# Patient Record
Sex: Female | Born: 1970 | Race: Black or African American | Hispanic: No | Marital: Single | Smoking: Never smoker
Health system: Southern US, Community
[De-identification: ages and names within clinical notes are randomized; demographics above are authoritative.]

## PROBLEM LIST (undated history)

## (undated) DIAGNOSIS — C649 Malignant neoplasm of unspecified kidney, except renal pelvis: Secondary | ICD-10-CM

## (undated) DIAGNOSIS — Z862 Personal history of diseases of the blood and blood-forming organs and certain disorders involving the immune mechanism: Secondary | ICD-10-CM

## (undated) DIAGNOSIS — D259 Leiomyoma of uterus, unspecified: Secondary | ICD-10-CM

## (undated) DIAGNOSIS — I8392 Asymptomatic varicose veins of left lower extremity: Secondary | ICD-10-CM

## (undated) HISTORY — DX: Malignant neoplasm of unspecified kidney, except renal pelvis: C64.9

## (undated) HISTORY — DX: Personal history of diseases of the blood and blood-forming organs and certain disorders involving the immune mechanism: Z86.2

## (undated) HISTORY — DX: Leiomyoma of uterus, unspecified: D25.9

---

## 1998-04-06 ENCOUNTER — Inpatient Hospital Stay (HOSPITAL_COMMUNITY): Admission: AD | Admit: 1998-04-06 | Discharge: 1998-04-06 | Payer: Self-pay | Admitting: Obstetrics and Gynecology

## 1998-05-21 ENCOUNTER — Inpatient Hospital Stay (HOSPITAL_COMMUNITY): Admission: AD | Admit: 1998-05-21 | Discharge: 1998-05-24 | Payer: Self-pay | Admitting: Obstetrics and Gynecology

## 1998-06-24 ENCOUNTER — Other Ambulatory Visit: Admission: RE | Admit: 1998-06-24 | Discharge: 1998-06-24 | Payer: Self-pay | Admitting: Obstetrics and Gynecology

## 2010-11-05 ENCOUNTER — Encounter: Payer: Self-pay | Admitting: Family Medicine

## 2013-01-29 ENCOUNTER — Encounter: Payer: Self-pay | Admitting: Obstetrics and Gynecology

## 2013-01-29 ENCOUNTER — Ambulatory Visit (INDEPENDENT_AMBULATORY_CARE_PROVIDER_SITE_OTHER): Payer: BC Managed Care – PPO | Admitting: Obstetrics and Gynecology

## 2013-01-29 VITALS — BP 124/82 | Ht 67.0 in | Wt 213.0 lb

## 2013-01-29 DIAGNOSIS — D649 Anemia, unspecified: Secondary | ICD-10-CM

## 2013-01-29 DIAGNOSIS — Z01419 Encounter for gynecological examination (general) (routine) without abnormal findings: Secondary | ICD-10-CM

## 2013-01-29 DIAGNOSIS — Z0000 Encounter for general adult medical examination without abnormal findings: Secondary | ICD-10-CM

## 2013-01-29 DIAGNOSIS — Z113 Encounter for screening for infections with a predominantly sexual mode of transmission: Secondary | ICD-10-CM

## 2013-01-29 DIAGNOSIS — N920 Excessive and frequent menstruation with regular cycle: Secondary | ICD-10-CM

## 2013-01-29 LAB — CBC
HCT: 32 % — ABNORMAL LOW (ref 36.0–46.0)
Hemoglobin: 10.3 g/dL — ABNORMAL LOW (ref 12.0–15.0)
MCH: 23.5 pg — ABNORMAL LOW (ref 26.0–34.0)
MCHC: 32.2 g/dL (ref 30.0–36.0)
MCV: 72.9 fL — ABNORMAL LOW (ref 78.0–100.0)
Platelets: 367 10*3/uL (ref 150–400)
RBC: 4.39 MIL/uL (ref 3.87–5.11)
RDW: 15.3 % (ref 11.5–15.5)
WBC: 5.5 10*3/uL (ref 4.0–10.5)

## 2013-01-29 LAB — POCT URINALYSIS DIPSTICK
Bilirubin, UA: NEGATIVE
Blood, UA: NEGATIVE
Glucose, UA: NEGATIVE
Ketones, UA: NEGATIVE
Leukocytes, UA: NEGATIVE
Nitrite, UA: NEGATIVE
Protein, UA: NEGATIVE
Urobilinogen, UA: 1
pH, UA: 5

## 2013-01-29 LAB — HEMOGLOBIN, FINGERSTICK: Hemoglobin, fingerstick: 10.5 g/dL — ABNORMAL LOW (ref 12.0–16.0)

## 2013-01-29 NOTE — Progress Notes (Signed)
Patient ID: Candace Graham, female   DOB: January 17, 1971, 42 y.o.   MRN: [id number] 42 y.o.  Single  African American female   [phone] here for annual exam.   Patient had exposure to HIV 20 years ago and did antiviral prophylaxis.  Never converted.    Menses changes over the last 6 months.  Had 2 menses last month for the first time ever.  Has some lower abdominal discomfort during menses.  Does not take NSAIDs.  Wears tampon and pads and changes every 1 - 2 hours.  Clotting.  Can stain clothing if not careful.  Patient thinks she may have fibroids but has never had an ultrasound.  Feels cold all the time.  No regular blood work in a long time.  Having som hot flashes.    Sexually active.  Has a new partner.  Does not use condoms. Used Norplant in the past but did not like it due to hair growth.    Mother had Graves Disease.    Patient's last menstrual period was 01/22/2013.          Sexually active: yes  The current method of family planning is none.   Declines pregnancy. Exercising: no Last mammogram: 11/2012 wnl:  [facility]  Last pap smear:  05/2012 wnl.   History of abnormal pap: no Smoking: no Alcohol: no Last colonoscopy: n/a Last Bone Density:  n/a Last tetanus shot: 8-10 yrs ago Had Hep B vaccine series. Last cholesterol check: 2-3 yrs ago  Hgb:  10.5              Urine: 1+urobilinogen    Health Maintenance  Topic Date Due  . Pap Smear  11/07/1987  . Tetanus/tdap  11/06/1988  . Influenza Vaccine  06/15/2013    Family History  Problem Relation Age of Onset  . Pancreatic cancer Mother   . Hypertension Mother   . Stroke Mother   . Thyroid disease Mother   . Hypertension Brother     There is no problem list on file for this patient.   Past Medical History  Diagnosis Date  . Abnormal uterine bleeding     Heavy menstrual cycles  . Anemia     History reviewed. No pertinent past surgical history.  Allergies: Review of patient's allergies  indicates no known allergies.  No current outpatient prescriptions on file.   No current facility-administered medications for this visit.    ROS: Pertinent items are noted in HPI.  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  Exam:    BP 124/82  Ht 5\' 7"  (1.702 m)  Wt 213 lb (96.616 kg)  BMI 33.35 kg/m2  LMP 01/22/2013   Wt Readings from Last 3 Encounters:  01/29/13 213 lb (96.616 kg)     Ht Readings from Last 3 Encounters:  01/29/13 5\' 7"  (1.702 m)    General appearance: alert, cooperative and appears stated age Head: Normocephalic, without obvious abnormality, atraumatic Neck: no adenopathy, supple, symmetrical, trachea midline and thyroid not enlarged, symmetric, no tenderness/mass/nodules Lungs: clear to auscultation bilaterally Breasts: Inspection negative, No nipple retraction or dimpling, No nipple discharge or bleeding, No axillary or supraclavicular adenopathy, Normal to palpation without dominant masses Heart: regular rate and rhythm Abdomen: soft, non-tender; bowel sounds normal; no masses,  no organomegaly Extremities: extremities normal, atraumatic, no cyanosis or edema Skin: Skin color, texture, turgor normal. No rashes or lesions Lymph nodes: Cervical, supraclavicular, and axillary nodes normal.  No abnormal inguinal nodes palpated Neurologic: Grossly normal   Pelvic: External genitalia:  no lesions              Urethra:  normal appearing urethra with no masses, tenderness or lesions              Bartholins and Skenes: normal                 Vagina: normal appearing vagina with normal color and discharge, no lesions              Cervix: normal appearance              Pap taken: yes and HR HPV.        Bimanual Exam:  Uterus:  uterus is normal size, shape, consistency and nontender                                      Adnexa: normal adnexa in size, nontender and no masses                                       Rectovaginal: Confirms                                      Anus:  normal sphincter tone, no lesions  A: normal gyn exam Menorrhagia with anemia. Desire for STD testing No current contraception     P: mammogram done.  Will sign release for results from [facility] STD testing pap smear and HR HPV Lipid profile, CMP, TSH, CBC, Fe and IBS studies Return for pelvic ultrasound and a discussion of options for treatment of menorrhagia Start OTC Fe sulfate 325 mg po q day Recheck hemoglobin in 4 weeks   An After Visit Summary was printed and given to the patient.

## 2013-01-29 NOTE — Patient Instructions (Addendum)
EXERCISE AND DIET:  We recommended that you start or continue a regular exercise program for good health. Regular exercise means any activity that makes your heart beat faster and makes you sweat.  We recommend exercising at least 30 minutes per day at least 3 days a week, preferably 4 or 5.  We also recommend a diet low in fat and sugar.  Inactivity, poor dietary choices and obesity can cause diabetes, heart attack, stroke, and kidney damage, among others.    ALCOHOL AND SMOKING:  Women should limit their alcohol intake to no more than 7 drinks/beers/glasses of wine (combined, not each!) per week. Moderation of alcohol intake to this level decreases your risk of breast cancer and liver damage. And of course, no recreational drugs are part of a healthy lifestyle.  And absolutely no smoking or even second hand smoke. Most people know smoking can cause heart and lung diseases, but did you know it also contributes to weakening of your bones? Aging of your skin?  Yellowing of your teeth and nails?  CALCIUM AND VITAMIN D:  Adequate intake of calcium and Vitamin D are recommended.  The recommendations for exact amounts of these supplements seem to change often, but generally speaking 600 mg of calcium (either carbonate or citrate) and 800 units of Vitamin D per day seems prudent. Certain women may benefit from higher intake of Vitamin D.  If you are among these women, your doctor will have told you during your visit.    PAP SMEARS:  Pap smears, to check for cervical cancer or precancers,  have traditionally been done yearly, although recent scientific advances have shown that most women can have pap smears less often.  However, every woman still should have a physical exam from her gynecologist every year. It will include a breast check, inspection of the vulva and vagina to check for abnormal growths or skin changes, a visual exam of the cervix, and then an exam to evaluate the size and shape of the uterus and  ovaries.  And after 43 years of age, a rectal exam is indicated to check for rectal cancers. We will also provide age appropriate advice regarding health maintenance, like when you should have certain vaccines, screening for sexually transmitted diseases, bone density testing, colonoscopy, mammograms, etc.   MAMMOGRAMS:  All women over 40 years old should have a yearly mammogram. Many facilities now offer a "3D" mammogram, which may cost around $50 extra out of pocket. If possible,  we recommend you accept the option to have the 3D mammogram performed.  It both reduces the number of women who will be called back for extra views which then turn out to be normal, and it is better than the routine mammogram at detecting truly abnormal areas.    COLONOSCOPY:  Colonoscopy to screen for colon cancer is recommended for all women at age 50.  We know, you hate the idea of the prep.  We agree, BUT, having colon cancer and not knowing it is worse!!  Colon cancer so often starts as a polyp that can be seen and removed at colonscopy, which can quite literally save your life!  And if your first colonoscopy is normal and you have no family history of colon cancer, most women don't have to have it again for 10 years.  Once every ten years, you can do something that may end up saving your life, right?  We will be happy to help you get it scheduled when you are ready.    Be sure to check your insurance coverage so you understand how much it will cost.  It may be covered as a preventative service at no cost, but you should check your particular policy.    Menorrhagia Dysfunctional uterine bleeding is different from a normal menstrual period. When periods are heavy or there is more bleeding than is usual for you, it is called menorrhagia. It may be caused by hormonal imbalance, or physical, metabolic, or other problems. Examination is necessary in order that your caregiver may treat treatable causes. If this is a continuing  problem, a D&C may be needed. That means that the cervix (the opening of the uterus or womb) is dilated (stretched larger) and the lining of the uterus is scraped out. The tissue scraped out is then examined under a microscope by a specialist (pathologist) to make sure there is nothing of concern that needs further or more extensive treatment. HOME CARE INSTRUCTIONS   If medications were prescribed, take exactly as directed. Do not change or switch medications without consulting your caregiver.  Long term heavy bleeding may result in iron deficiency. Your caregiver may have prescribed iron pills. They help replace the iron your body lost from heavy bleeding. Take exactly as directed. Iron may cause constipation. If this becomes a problem, increase the bran, fruits, and roughage in your diet.  Do not take aspirin or medicines that contain aspirin one week before or during your menstrual period. Aspirin may make the bleeding worse.  If you need to change your sanitary pad or tampon more than once every 2 hours, stay in bed and rest as much as possible until the bleeding stops.  Eat well-balanced meals. Eat foods high in iron. Examples are leafy green vegetables, meat, liver, eggs, and whole grain breads and cereals. Do not try to lose weight until the abnormal bleeding has stopped and your blood iron level is back to normal. SEEK MEDICAL CARE IF:   You need to change your sanitary pad or tampon more than once an hour.  You develop nausea (feeling sick to your stomach) and vomiting, dizziness, or diarrhea while you are taking your medicine.  You have any problems that may be related to the medicine you are taking. SEEK IMMEDIATE MEDICAL CARE IF:   You have a fever.  You develop chills.  You develop severe bleeding or start to pass blood clots.  You feel dizzy or faint. MAKE SURE YOU:   Understand these instructions.  Will watch your condition.  Will get help right away if you are not  doing well or get worse. Document Released: 10/01/2005 Document Revised: 12/24/2011 Document Reviewed: 05/21/2008 [facility] Patient Information 2013 [place], [place].

## 2013-01-30 LAB — COMPREHENSIVE METABOLIC PANEL
ALT: 16 U/L (ref 0–35)
AST: 19 U/L (ref 0–37)
Albumin: 3.8 g/dL (ref 3.5–5.2)
Alkaline Phosphatase: 65 U/L (ref 39–117)
BUN: 13 mg/dL (ref 6–23)
CO2: 28 mEq/L (ref 19–32)
Calcium: 9 mg/dL (ref 8.4–10.5)
Chloride: 106 mEq/L (ref 96–112)
Creat: 0.8 mg/dL (ref 0.50–1.10)
Glucose, Bld: 91 mg/dL (ref 70–99)
Potassium: 4.1 mEq/L (ref 3.5–5.3)
Sodium: 141 mEq/L (ref 135–145)
Total Bilirubin: 0.2 mg/dL — ABNORMAL LOW (ref 0.3–1.2)
Total Protein: 6.6 g/dL (ref 6.0–8.3)

## 2013-01-30 LAB — IPS PAP TEST WITH HPV

## 2013-01-30 LAB — TSH: TSH: 3.07 u[IU]/mL (ref 0.350–4.500)

## 2013-01-30 LAB — LIPID PANEL
Cholesterol: 152 mg/dL (ref 0–200)
HDL: 45 mg/dL (ref >39)
LDL Cholesterol: 96 mg/dL (ref 0–99)
Total CHOL/HDL Ratio: 3.4 Ratio
Triglycerides: 53 mg/dL (ref <150)
VLDL: 11 mg/dL (ref 0–40)

## 2013-01-30 LAB — IBC PANEL
%SAT: 7 % — ABNORMAL LOW (ref 20–55)
TIBC: 406 ug/dL (ref 250–470)
UIBC: 378 ug/dL (ref 125–400)

## 2013-01-30 LAB — IPS N GONORRHOEA AND CHLAMYDIA BY PCR

## 2013-01-30 LAB — HIV ANTIBODY (ROUTINE TESTING W REFLEX): HIV: NONREACTIVE

## 2013-01-30 LAB — HEPATITIS C ANTIBODY: HCV Ab: NEGATIVE

## 2013-01-30 LAB — RPR

## 2013-01-30 LAB — IRON: Iron: 28 ug/dL — ABNORMAL LOW (ref 42–145)

## 2013-02-16 ENCOUNTER — Ambulatory Visit (INDEPENDENT_AMBULATORY_CARE_PROVIDER_SITE_OTHER): Payer: BC Managed Care – PPO

## 2013-02-16 ENCOUNTER — Other Ambulatory Visit: Payer: Self-pay | Admitting: Obstetrics and Gynecology

## 2013-02-16 ENCOUNTER — Ambulatory Visit (INDEPENDENT_AMBULATORY_CARE_PROVIDER_SITE_OTHER): Payer: BC Managed Care – PPO | Admitting: Obstetrics and Gynecology

## 2013-02-16 ENCOUNTER — Encounter: Payer: Self-pay | Admitting: Obstetrics and Gynecology

## 2013-02-16 VITALS — BP 124/78

## 2013-02-16 DIAGNOSIS — D250 Submucous leiomyoma of uterus: Secondary | ICD-10-CM

## 2013-02-16 DIAGNOSIS — N852 Hypertrophy of uterus: Secondary | ICD-10-CM

## 2013-02-16 DIAGNOSIS — D509 Iron deficiency anemia, unspecified: Secondary | ICD-10-CM

## 2013-02-16 DIAGNOSIS — N920 Excessive and frequent menstruation with regular cycle: Secondary | ICD-10-CM

## 2013-02-16 DIAGNOSIS — D649 Anemia, unspecified: Secondary | ICD-10-CM

## 2013-02-16 DIAGNOSIS — N8300 Follicular cyst of ovary, unspecified side: Secondary | ICD-10-CM

## 2013-02-16 DIAGNOSIS — D259 Leiomyoma of uterus, unspecified: Secondary | ICD-10-CM

## 2013-02-16 DIAGNOSIS — D219 Benign neoplasm of connective and other soft tissue, unspecified: Secondary | ICD-10-CM | POA: Insufficient documentation

## 2013-02-16 DIAGNOSIS — D251 Intramural leiomyoma of uterus: Secondary | ICD-10-CM

## 2013-02-16 MED ORDER — NORETHIN ACE-ETH ESTRAD-FE 1-20 MG-MCG(24) PO TABS: 1.0000 | ORAL_TABLET | Freq: Every day | ORAL | Status: DC

## 2013-02-16 NOTE — Progress Notes (Signed)
Subjective  Patient here for ultrasound, sonohysterogram, and discussion of results.  Diagnosed with menorrhagia ane iron deficiency anemia at annual exam.  Has heavy menses and has had only one episode of bleeding twice per month.  Not taking OTC iron regularly.    Patient declines future childbearing and is asking for contraception.  Patient's partner is present who is expressing some interest in pregnancy.  Objective  Consent for pelvic ultrasound and sonohysterogram.  See report below.  Assessment  Multifibroid uterus with two intracavitary masses, both suggestive of submucosal fibroids. Anemia. Need for contraception.  Plan  I have discussed with patient and her partner uterine fibroids and treatment options including medical therapy with OCPs, myomectomy - all routes, uterine artery embolization, and hysterectomy.  She has previously not liked Norplant.  Currently she would like to do oral contraception for a 3 month trial while she considers her other choices.  Rx for LoEstrin 24 3 months.  See Epic orders.  Risks and benefits reviewed.  Patient will return for re-evaluation in 2 months and have a Hgb at that time.    If has recurrent irregular bleeding, patient understands she will need an endometrial biopsy.

## 2013-02-16 NOTE — Patient Instructions (Addendum)

## 2013-02-26 ENCOUNTER — Other Ambulatory Visit: Payer: BC Managed Care – PPO

## 2013-04-06 ENCOUNTER — Ambulatory Visit (INDEPENDENT_AMBULATORY_CARE_PROVIDER_SITE_OTHER): Payer: BC Managed Care – PPO | Admitting: Obstetrics and Gynecology

## 2013-04-06 ENCOUNTER — Encounter: Payer: Self-pay | Admitting: Obstetrics and Gynecology

## 2013-04-06 VITALS — BP 112/66 | HR 80 | Ht 67.0 in | Wt 213.0 lb

## 2013-04-06 DIAGNOSIS — D649 Anemia, unspecified: Secondary | ICD-10-CM

## 2013-04-06 DIAGNOSIS — N912 Amenorrhea, unspecified: Secondary | ICD-10-CM

## 2013-04-06 LAB — CBC
HCT: 34.8 % — ABNORMAL LOW (ref 36.0–46.0)
Hemoglobin: 11.1 g/dL — ABNORMAL LOW (ref 12.0–15.0)
MCH: 23.6 pg — ABNORMAL LOW (ref 26.0–34.0)
MCHC: 31.9 g/dL (ref 30.0–36.0)
MCV: 73.9 fL — ABNORMAL LOW (ref 78.0–100.0)
Platelets: 348 10*3/uL (ref 150–400)
RBC: 4.71 MIL/uL (ref 3.87–5.11)
RDW: 18 % — ABNORMAL HIGH (ref 11.5–15.5)
WBC: 6.4 10*3/uL (ref 4.0–10.5)

## 2013-04-06 LAB — POCT URINE PREGNANCY: Preg Test, Ur: POSITIVE

## 2013-04-06 NOTE — Patient Instructions (Addendum)
Prenatal Care  WHAT IS PRENATAL CARE?  Prenatal care means health care during your pregnancy, before your baby is born. Take care of yourself and your baby by:   Getting early prenatal care. If you know you are pregnant, or think you might be pregnant, call your caregiver as soon as possible. Schedule a visit for a general/prenatal examination.  Getting regular prenatal care. Follow your caregiver's schedule for blood and other necessary tests. Do not miss appointments.  Do everything you can to keep yourself and your baby healthy during your pregnancy.  Prenatal care should include evaluation of medical, dietary, educational, psychological, and social needs for the couple and the medical, surgical, and genetic history of the family of the mother and father.  Discuss with your caregiver:  Your medicines, prescription, over-the-counter, and herbal medicines.  Substance abuse, alcohol, smoking, and illegal drugs.  Domestic abuse and violence, if present.  Your immunizations.  Nutrition and diet.  Exercising.  Environment and occupational hazards, at home and at work.  History of sexually transmitted disease, both you and your partner.  Previous pregnancies. WHY IS PRENATAL CARE SO IMPORTANT?  By seeing you regularly, your caregiver has the chance to find problems early, so that they can be treated as soon as possible. Other problems might be prevented. Many studies have shown that early and regular prenatal care is important for the health of both mothers and their babies.  I AM THINKING ABOUT GETTING PREGNANT. HOW CAN I TAKE CARE OF MYSELF?  Taking care of yourself before you get pregnant helps you to have a healthy pregnancy. It also lowers your chances of having a baby born with a birth defect. Here are ways to take care of yourself before you get pregnant:   Eat healthy foods, exercise regularly (30 minutes per day for most days of the week is best), and get enough rest and  sleep. Talk to your caregiver about what kinds of foods and exercises are best for you.  Take 400 micrograms (mcg) of folic acid (one of the B vitamins) every day. The best way to do this is to take a daily multivitamin pill that contains this amount of folic acid. Getting enough of the synthetic (manufactured) form of folic acid every day before you get pregnant and during early pregnancy can help prevent certain birth defects. Many breakfast cereals and other grain products have folic acid added to them, but only certain cereals contain 400 mcg of folic acid per serving. Check the label on your multivitamin or cereal to find the amount of folic acid in the food.  See your caregiver for a complete check up before getting pregnant. Make sure that you have had all your immunization shots, especially for rubella ([place] measles). Rubella can cause serious birth defects. Chickenpox is another illness you want to avoid during pregnancy. If you have had chickenpox and rubella in the past, you should be immune to them.  Tell your caregiver about any prescription or non-prescription medicines (including herbal remedies) you are taking. Some medicines are not safe to take during pregnancy.  Stop smoking cigarettes, drinking alcohol, or taking illegal drugs. Ask your caregiver for help, if you need it. You can also get help with alcohol and drugs by talking with a member of your faith community, a counselor, or a trusted friend.  Discuss and treat any medical, social, or psychological problems before getting pregnant.  Discuss any history of genetic problems in the mother, father, and their families. Do  genetic testing before getting pregnant, when possible.  Discuss any physical or emotional abuse with your caregiver.  Discuss with your caregiver if you might be exposed to harmful chemicals on your job or where you live.  Discuss with your caregiver if you think your job or the hours you work may be  harmful and should be changed.  The father should be involved with the decision making and with all aspects of the pregnancy, labor, and delivery.  If you have medical insurance, make sure you are covered for pregnancy. I JUST FOUND OUT THAT I AM PREGNANT. HOW CAN I TAKE CARE OF MYSELF?  Here are ways to take care of yourself and the precious new life growing inside you:   Continue taking your multivitamin with 400 micrograms (mcg) of folic acid every day.  Get early and regular prenatal care. It does not matter if this is your first pregnancy or if you already have children. It is very important to see a caregiver during your pregnancy. Your caregiver will check at each visit to make sure that you and the baby are healthy. If there are any problems, action can be taken right away to help you and the baby.  Eat a healthy diet that includes:  Fruits.  Vegetables.  Foods low in saturated fat.  Grains.  Calcium-rich foods.  Drink 6 to 8 glasses of liquids a day.  Unless your caregiver tells you not to, try to be physically active for 30 minutes, most days of the week. If you are pressed for time, you can get your activity in through 10 minute segments, three times a day.  If you smoke, drink alcohol, or use drugs, STOP. These can cause long-term damage to your baby. Talk with your caregiver about steps to take to stop smoking. Talk with a member of your faith community, a counselor, a trusted friend, or your caregiver if you are concerned about your alcohol or drug use.  Ask your caregiver before taking any medicine, even over-the-counter medicines. Some medicines are not safe to take during pregnancy.  Get plenty of rest and sleep.  Avoid hot tubs and saunas during pregnancy.  Do not have X-rays taken, unless absolutely necessary and with the recommendation of your caregiver. A lead shield can be placed on your abdomen, to protect the baby when X-rays are taken in other parts of the  body.  Do not empty the cat litter when you are pregnant. It may contain a parasite that causes an infection called toxoplasmosis, which can cause birth defects. Also, use gloves when working in garden areas used by cats.  Do not eat uncooked or undercooked cheese, meats, or fish.  Stay away from toxic chemicals like:  Insecticides.  Solvents (some cleaners or paint thinners).  Lead.  Mercury.  Sexual relations may continue until the end of the pregnancy, unless you have a medical problem or there is a problem with the pregnancy and your caregiver tells you not to.  Do not wear high heel shoes, especially during the second half of the pregnancy. You can lose your balance and fall.  Do not take long trips, unless absolutely necessary. Be sure to see your caregiver before going on the trip.  Do not sit in one position for more than 2 hours, when on a trip.  Take a copy of your medical records when going on a trip.  Know where there is a hospital in the city you are visiting, in case of an  emergency.  Most dangerous household products will have pregnancy warnings on their labels. Ask your caregiver about products if you are unsure.  Limit or eliminate your caffeine intake from coffee, tea, sodas, medicines, and chocolate.  Many women continue working through pregnancy. Staying active might help you stay healthier. If you have a question about the safety or the hours you work at your particular job, talk with your caregiver.  Get informed:  Read books.  Watch videos.  Go to childbirth classes for you and the father.  Talk with experienced moms.  Ask your caregiver about childbirth education classes for you and your partner. Classes can help you and your partner prepare for the birth of your baby.  Ask about a pediatrician (baby doctor) and methods and pain medicine for labor, delivery, and possible Cesarean delivery (C-section). I AM NOT THINKING ABOUT GETTING PREGNANT  RIGHT NOW, BUT HEARD THAT ALL WOMEN SHOULD TAKE FOLIC ACID EVERY DAY?  All women of childbearing age, with even a remote chance of getting pregnant, should try to make sure they get enough folic acid. Many pregnancies are not planned. Many women do not know they are actually pregnant early in their pregnancies, and certain birth defects happen in the very early part of pregnancy. Taking 400 micrograms (mcg) of folic acid every day will help prevent certain birth defects that happen in the early part of pregnancy. If a woman begins taking vitamin pills in the second or third month of pregnancy, it may be too late to prevent birth defects. Folic acid may also have other health benefits for women, besides preventing birth defects.  HOW OFTEN SHOULD I SEE MY CAREGIVER DURING PREGNANCY?  Your caregiver will give you a schedule for your prenatal visits. You will have visits more often as you get closer to the end of your pregnancy. An average pregnancy lasts about 40 weeks.  A typical schedule includes visiting your caregiver:   About once each month, during your first 6 months of pregnancy.  Every 2 weeks, during the next 2 months.  Weekly in the last month, until the delivery date. Your caregiver will probably want to see you more often if:  You are over 35.  Your pregnancy is high risk, because you have certain health problems or problems with the pregnancy, such as:  Diabetes.  High blood pressure.  The baby is not growing on schedule, according to the dates of the pregnancy. Your caregiver will do special tests, to make sure you and the baby are not having any serious problems. WHAT HAPPENS DURING PRENATAL VISITS?   At your first prenatal visit, your caregiver will talk to you about you and your partner's health history and your family's health history, and will do a physical exam.  On your first visit, a physical exam will include checks of your blood pressure, height and weight, and an  exam of your pelvic organs. Your caregiver will do a Pap test if you have not had one recently, and will do cultures of your cervix to make sure there is no infection.  At each visit, there will be tests of your blood, urine, blood pressure, weight, and checking the progress of the baby.  Your caregiver will be able to tell you when to expect that your baby will be born.  Each visit is also a chance for you to learn about staying healthy during pregnancy and for asking questions.  Discuss whether you will be breastfeeding.  At your later prenatal  visits, your caregiver will check how you are doing and how the baby is developing. You may have a number of tests done as your pregnancy progresses.  Ultrasound exams are often used to check on the baby's growth and health.  You may have more urine and blood tests, as well as special tests, if needed. These may include amniocentesis (examine fluid in the pregnancy sac), stress tests (check how baby responds to contractions), biophysical profile (measures fetus well-being). Your caregiver will explain the tests and why they are necessary. I AM IN MY LATE THIRTIES, AND I WANT TO HAVE A CHILD NOW. SHOULD I DO ANYTHING SPECIAL?  As you get older, there is more chance of having a medical problem (high blood pressure), pregnancy problem (preeclampsia, problems with the placenta), miscarriage, or a baby born with a birth defect. However, most women in their late thirties and early forties have healthy babies. See your caregiver on a regular basis before you get pregnant and be sure to go for exams throughout your pregnancy. Your caregiver probably will want to do some special tests to check on you and your baby's health when you are pregnant.  Women today are often delaying having children until later in life, when they are in their thirties and forties. While many women in their thirties and forties have no difficulty getting pregnant, fertility does decline  with age. For women over 40 who cannot get pregnant after 6 months of trying, it is recommended that they see their caregiver for a fertility evaluation. It is not uncommon to have trouble becoming pregnant or experience infertility (inability to become pregnant after trying for one year). If you think that you or your partner may be infertile, you can discuss this with your caregiver. He or she can recommend treatments such as drugs, surgery, or assisted reproductive technology.  Document Released: 10/04/2003 Document Revised: 12/24/2011 Document Reviewed: 08/31/2009 [facility] Patient Information 2014 [place], [place].

## 2013-04-06 NOTE — Progress Notes (Signed)
Patient ID: Candace Graham, female   DOB: Feb 28, 1971, 42 y.o.   MRN: [id number]  Subjective 42 year old G26P1 African American female with LMP 01/16/13 who presents with a positive home pregnancy test. Did multiple pregnancy tests which were negative. Did a UPT 2 weeks ago and it was positive. Bilateral lower abdomen uncomfortable and low back hurting.   Feels fatigue and bloating.   No nausea.   Patient conflicted about pregnancy.  Partner is enthusiastic but places most importance on patient's health.  Patient is status post pelvic ultrasound and sonohysterogram in 02/15/13 showing a multifibroid uterus and two intracavitary fibroids.  Anemia diagnosed on 01/29/13; Hgb 10.5.  Started FeSo4.  Patient recently stopped when had positive pregnancy test.  Still feels tired.  Objective Abdomen - soft, mildly tender bilateral lower quadrants, no guarding, no rebound.  No masses.  No hepatosplenomegaly.  UPT in office - weakly positive  Assessment Early pregnancy. No acute abdomen. AMA status. Multifibroid uterus.  Plan Check Quantitative BHCG today and 6/25. Recheck CBC. Long discussion with patient and partner regarding likely timing of conception being recent, AMA status, and fibroid uterus and pregnancy, and pregnancy care in general. Ectopic precautions given. Start PNV. Avoidance of exposures discussed.

## 2013-04-07 LAB — HCG, QUANTITATIVE, PREGNANCY: hCG, Beta Chain, Quant, S: 5332.2 m[IU]/mL

## 2013-04-08 ENCOUNTER — Ambulatory Visit (HOSPITAL_COMMUNITY)
Admission: RE | Admit: 2013-04-08 | Discharge: 2013-04-08 | Disposition: A | Discharge status: Home / Self Care | Payer: BC Managed Care – PPO | Source: Ambulatory Visit | Attending: Obstetrics and Gynecology | Admitting: Obstetrics and Gynecology

## 2013-04-08 ENCOUNTER — Other Ambulatory Visit: Payer: Self-pay | Admitting: Obstetrics & Gynecology

## 2013-04-08 ENCOUNTER — Other Ambulatory Visit: Payer: Self-pay | Admitting: Obstetrics and Gynecology

## 2013-04-08 ENCOUNTER — Telehealth: Payer: Self-pay | Admitting: *Deleted

## 2013-04-08 ENCOUNTER — Ambulatory Visit: Payer: BC Managed Care – PPO | Admitting: Obstetrics and Gynecology

## 2013-04-08 DIAGNOSIS — Z8759 Personal history of other complications of pregnancy, childbirth and the puerperium: Secondary | ICD-10-CM

## 2013-04-08 DIAGNOSIS — O3680X0 Pregnancy with inconclusive fetal viability, not applicable or unspecified: Secondary | ICD-10-CM | POA: Insufficient documentation

## 2013-04-08 DIAGNOSIS — Z3689 Encounter for other specified antenatal screening: Secondary | ICD-10-CM | POA: Insufficient documentation

## 2013-04-08 NOTE — Telephone Encounter (Signed)
Message copied by Alisa Graff on Wed Apr 08, 2013  1:48 PM ------      Message from: Conley Simmonds      Created: Tue Apr 07, 2013  5:56 AM       Results released to patient in My Chart.            Kennon Rounds, please contact the patient also with her test rests and schedule an ultrasound.  I am suggesting a hospital ultrasound to facilitate scheduling for this patient as it needs to be done this week, sooner than later.  I told the patient that we will be repeating an HCG tomorrow, but the number is high enough to proceed with ultrasound as the next step.  We should be able to see evidence of the pregnancy on ultrasound.             Her hemoglobin is still a little low, and I recommend she restarts her iron, FeSO4 325 mg daily in addition to a prenatal vitamin.            Thank you,            Conley Simmonds ------

## 2013-04-08 NOTE — Telephone Encounter (Signed)
Patient notified of Quant result and dr Rica Records recommendation for ultrasound to confirm IUP.  Patient states she is concerned about risk of pregnancy at her age and may not continue the pregnancy so not sure she needs ultrasound. Advised that need to confirm IUP and this will also help her with dates for any decision she wants to make. PUS scheduled for today at [facility] at 330. Instructed to take iron supplement.

## 2013-04-11 NOTE — Progress Notes (Addendum)
Patient ID: Candace Graham, female   DOB: August 11, 1971, 42 y.o.   MRN: [id number]  Subjective  Patient presents to [facility] for early ultrasound.  She had a positive UPT in office 04/06/13 and a BHCG of 5332.2. Has multifibroid uterus.  Patient considering termination of pregnancy.  Concerned about her age, medical risks, and fibroid uterus.   Objective  Transvaginal ultrasound documenting IUD with gestational sac 5+4 weeks with yolk sac and without fetal pole or cardiac activity.   3 fibroids measured.  See report.  Assessment  Early intrauterine pregnancy. AMA status.  Plan  In Radiology at the [facility], I discussed early pregnancy with patient and the ultrasound findings.  She understands that viable pregnancy cannot be confirmed until repeat ultrasound is performed in about one week and cardiac activity seen.  She understands that possible conception occurred at the beginning of this June.   She has asked for referral to a pregnancy termination clinic in the [place] area.   Will follow up in the office for further care of her fibroid uterus after pregnancy ends.

## 2013-04-22 ENCOUNTER — Ambulatory Visit: Payer: BC Managed Care – PPO | Admitting: Obstetrics and Gynecology

## 2013-05-14 ENCOUNTER — Telehealth: Payer: Self-pay | Admitting: Nurse Practitioner

## 2013-05-14 NOTE — Telephone Encounter (Signed)
Patient was prescribed Lomeida . Now she is experiencing chest pains and bleeding.

## 2013-05-18 ENCOUNTER — Telehealth: Payer: Self-pay | Admitting: *Deleted

## 2013-05-18 NOTE — Telephone Encounter (Signed)
Patient called to state she is no better with bleeding . She had to stop BC pills prescribed due to chest pain she was having. Patient request follow up with Dr. Edward Jolly concerning this and the vaginal bleeding she is having. Appointment given with Dr. Edward Jolly for August 6th  @ 11:30am. Last OV 04/06/2013 Amenorrhea,  + HCG EPIC Pelvic US 04/08/2013  EPIC

## 2013-05-20 ENCOUNTER — Ambulatory Visit (INDEPENDENT_AMBULATORY_CARE_PROVIDER_SITE_OTHER): Payer: BC Managed Care – PPO | Admitting: Obstetrics and Gynecology

## 2013-05-20 ENCOUNTER — Encounter: Payer: Self-pay | Admitting: Obstetrics and Gynecology

## 2013-05-20 VITALS — BP 110/76 | HR 70 | Ht 67.0 in | Wt 208.5 lb

## 2013-05-20 DIAGNOSIS — N939 Abnormal uterine and vaginal bleeding, unspecified: Secondary | ICD-10-CM

## 2013-05-20 DIAGNOSIS — N926 Irregular menstruation, unspecified: Secondary | ICD-10-CM

## 2013-05-20 LAB — CBC
HCT: 37 % (ref 36.0–46.0)
Hemoglobin: 12.1 g/dL (ref 12.0–15.0)
MCH: 25.1 pg — ABNORMAL LOW (ref 26.0–34.0)
MCHC: 32.7 g/dL (ref 30.0–36.0)
MCV: 76.8 fL — ABNORMAL LOW (ref 78.0–100.0)
Platelets: 384 10*3/uL (ref 150–400)
RBC: 4.82 MIL/uL (ref 3.87–5.11)
RDW: 16.7 % — ABNORMAL HIGH (ref 11.5–15.5)
WBC: 5.1 10*3/uL (ref 4.0–10.5)

## 2013-05-20 MED ORDER — NORETHINDRONE 0.35 MG PO TABS: 1.0000 | ORAL_TABLET | Freq: Every day | ORAL | Status: DC

## 2013-05-20 NOTE — Patient Instructions (Addendum)
Norethindrone tablets (contraception) What is this medicine? NORETHINDRONE is an oral contraceptive. The product contains a female hormone known as a progestin. It is used to prevent pregnancy. This medicine may be used for other purposes; ask your health care provider or pharmacist if you have questions. What should I tell my health care provider before I take this medicine? They need to know if you have any of these conditions: -blood vessel disease or blood clots -breast, cervical, or vaginal cancer -diabetes -heart disease -kidney disease -liver disease -mental depression -migraine -seizures -stroke -vaginal bleeding -an unusual or allergic reaction to norethindrone, other medicines, foods, dyes, or preservatives -pregnant or trying to get pregnant -breast-feeding How should I use this medicine? Take this medicine by mouth with a glass of water. You may take it with or without food. Follow the directions on the prescription label. Take this medicine at the same time each day and in the order directed on the package. Do not take your medicine more often than directed. Contact your pediatrician regarding the use of this medicine in children. Special care may be needed. This medicine has been used in female children who have started having menstrual periods. A patient package insert for the product will be given with each prescription and refill. Read this sheet carefully each time. The sheet may change frequently. Overdosage: If you think you have taken too much of this medicine contact a poison control center or emergency room at once. NOTE: This medicine is only for you. Do not share this medicine with others. What if I miss a dose? Try not to miss a dose. Every time you miss a dose or take a dose late your chance of pregnancy increases. When 1 pill is missed (even if only 3 hours late), take the missed pill as soon as possible and continue taking a pill each day at the regular time  (use a back up method of birth control for the next 48 hours). If more than 1 dose is missed, use an additional birth control method for the rest of your pill pack until menses occurs. Contact your health care professional if more than 1 dose has been missed. What may interact with this medicine? Do not take this medicine with any of the following medications: -amprenavir or fosamprenavir -bosentan This medicine may also interact with the following medications: -antibiotics or medicines for infections, especially rifampin, rifabutin, rifapentine, and griseofulvin, and possibly penicillins or tetracyclines -aprepitant -barbiturate medicines, such as phenobarbital -carbamazepine -felbamate -modafinil -oxcarbazepine -phenytoin -ritonavir or other medicines for HIV infection or AIDS -St. John's wort -topiramate This list may not describe all possible interactions. Give your health care provider a list of all the medicines, herbs, non-prescription drugs, or dietary supplements you use. Also tell them if you smoke, drink alcohol, or use illegal drugs. Some items may interact with your medicine. What should I watch for while using this medicine? Visit your doctor or health care professional for regular checks on your progress. You will need a regular breast and pelvic exam and Pap smear while on this medicine. Use an additional method of birth control during the first cycle that you take these tablets. If you have any reason to think you are pregnant, stop taking this medicine right away and contact your doctor or health care professional. If you are taking this medicine for hormone related problems, it may take several cycles of use to see improvement in your condition. This medicine does not protect you against HIV infection (AIDS)  or any other sexually transmitted diseases. What side effects may I notice from receiving this medicine? Side effects that you should report to your doctor or health  care professional as soon as possible: -breast tenderness or discharge -pain in the abdomen, chest, groin or leg -severe headache -skin rash, itching, or hives -sudden shortness of breath -unusually weak or tired -vision or speech problems -yellowing of skin or eyes Side effects that usually do not require medical attention (report to your doctor or health care professional if they continue or are bothersome): -changes in sexual desire -change in menstrual flow -facial hair growth -fluid retention and swelling -headache -irritability -nausea -weight gain or loss This list may not describe all possible side effects. Call your doctor for medical advice about side effects. You may report side effects to FDA at 1-800-FDA-1088. Where should I keep my medicine? Keep out of the reach of children. Store at room temperature between 15 and 30 degrees C (59 and 86 degrees F). Throw away any unused medicine after the expiration date. NOTE: This sheet is a summary. It may not cover all possible information. If you have questions about this medicine, talk to your doctor, pharmacist, or health care provider.  2013, [employer]. (09/16/2008 1:54:05 PM)

## 2013-05-20 NOTE — Progress Notes (Signed)
Patient ID: Candace Graham, female   DOB: October 04, 1971, 42 y.o.   MRN: [id number]  Subjective  Here for discussion of fibroids and possible treatment options.  Patient had VIP on April 28, 2013 at [facility] in [place].  No complications.  Was not required to come back for follow up.  Had light bleeding following procedure.  Started birth control pills a few days later - LoEstrin 24Fe and stopped it due to chest bilateral pains and headaches.    Patient had bleeding on and off while on OCPs.    When stopped OCPS on August 2nd, then started with heavy bleeding.  No cramping.    Objective  Abdomen - soft, nontender, nondistended.  No hepatosplenomagaly or organomegaly.  Pelvic exam - Normal external genitalia and urethra.   Cervix and vagina no lesions.  No blood noted. 10 - 11 week size uterus, nontender. No adnexal masses or tenderness.   Assessment  Status post VIP Fibroid uterus. Abnormal uterine bleeding  Plan  Check quantitative BHCG and CBC now.  Rx for Ortho Micronor to start with next cycle.  See Epic orders.  Patient has been instructed in use.   We discussed nonestrogenic alternatives such as Depo Provera and uterine artery embolization (which would need to be accompanied by a form of pregnancy prevention.)  Patient and discussed hysterectomy and reviewed the ACOG handout on hysterectomy.  We discussed DaVinci robotic total laparoscopic hysterectomy with bilateral salpingectomy.  We discussed benefits of this approach and risks of hysterectomy which include but are not limited to bleeding, infection, damage to surrounding organs, DVT, PE, death, and reaction to anesthesia.  I also gave the patient a CD ROM about DaVinci hysterectomy. I addressed intraperitoneal morcellation issues which I would not anticipate.  Patient, however, may need vaginal morcellation of the specimen for removal through the colpotomy incision.

## 2013-05-21 ENCOUNTER — Telehealth: Payer: Self-pay | Admitting: Obstetrics and Gynecology

## 2013-05-21 DIAGNOSIS — N939 Abnormal uterine and vaginal bleeding, unspecified: Secondary | ICD-10-CM

## 2013-05-21 LAB — HCG, QUANTITATIVE, PREGNANCY: hCG, Beta Chain, Quant, S: 31.1 m[IU]/mL

## 2013-05-21 NOTE — Telephone Encounter (Signed)
Phone call to discuss results of HCG showing 31.1 post VIP.  I recommend rechecking the quantitative HCG in one week.  Patient understands the importance of doing this.  Will not start OCPS until HCG less than 5.  No intercourse during this time until the HCG is negative.

## 2013-05-27 ENCOUNTER — Other Ambulatory Visit (INDEPENDENT_AMBULATORY_CARE_PROVIDER_SITE_OTHER): Payer: BC Managed Care – PPO

## 2013-05-27 DIAGNOSIS — N926 Irregular menstruation, unspecified: Secondary | ICD-10-CM

## 2013-05-27 DIAGNOSIS — N939 Abnormal uterine and vaginal bleeding, unspecified: Secondary | ICD-10-CM

## 2013-05-28 ENCOUNTER — Other Ambulatory Visit: Payer: Self-pay | Admitting: Obstetrics and Gynecology

## 2013-05-28 DIAGNOSIS — N939 Abnormal uterine and vaginal bleeding, unspecified: Secondary | ICD-10-CM

## 2013-05-28 LAB — HCG, QUANTITATIVE, PREGNANCY: hCG, Beta Chain, Quant, S: 13.4 m[IU]/mL

## 2013-05-29 ENCOUNTER — Telehealth: Payer: Self-pay

## 2013-05-29 NOTE — Telephone Encounter (Signed)
Message copied by Alphonsa Overall on Fri May 29, 2013 10:29 AM ------      Message from: Conley Simmonds      Created: Thu May 28, 2013  8:35 AM       Result to patient through My Chart.            Needs repeat BHCG in 1 week. ------

## 2013-05-29 NOTE — Telephone Encounter (Signed)
Called pt. To make sure she received results of BHCG on My chart and she did.  Advised needs repeat BHCG in 1 week.  Patient states will have to call back to make appt.

## 2013-06-02 ENCOUNTER — Telehealth: Payer: Self-pay

## 2013-06-02 NOTE — Telephone Encounter (Signed)
Message copied by Alphonsa Overall on Tue Jun 02, 2013  9:35 AM ------      Message from: Conley Simmonds      Created: Thu May 28, 2013  8:35 AM       Result to patient through My Chart.            Needs repeat BHCG in 1 week. ------

## 2013-06-02 NOTE — Telephone Encounter (Signed)
LMOVM  Needs to schedule repeat BHCG (prefer by Thurs)

## 2013-06-04 ENCOUNTER — Encounter: Payer: Self-pay | Admitting: Obstetrics and Gynecology

## 2013-06-04 ENCOUNTER — Other Ambulatory Visit (INDEPENDENT_AMBULATORY_CARE_PROVIDER_SITE_OTHER): Payer: BC Managed Care – PPO

## 2013-06-04 DIAGNOSIS — N926 Irregular menstruation, unspecified: Secondary | ICD-10-CM

## 2013-06-04 DIAGNOSIS — N939 Abnormal uterine and vaginal bleeding, unspecified: Secondary | ICD-10-CM

## 2013-06-05 ENCOUNTER — Other Ambulatory Visit: Payer: Self-pay | Admitting: Obstetrics and Gynecology

## 2013-06-05 DIAGNOSIS — N939 Abnormal uterine and vaginal bleeding, unspecified: Secondary | ICD-10-CM

## 2013-06-05 LAB — HCG, QUANTITATIVE, PREGNANCY: hCG, Beta Chain, Quant, S: 6.8 m[IU]/mL

## 2013-06-05 NOTE — Telephone Encounter (Signed)
Dr. Edward Jolly contacted pt.through My Chart and patient had repeat BHCG on 06-04-13.

## 2013-06-05 NOTE — Telephone Encounter (Signed)
Patient came in 06-04-13 and had BHCG repeated.

## 2013-06-12 ENCOUNTER — Encounter: Payer: Self-pay | Admitting: Obstetrics and Gynecology

## 2013-06-12 ENCOUNTER — Telehealth: Payer: Self-pay

## 2013-06-12 NOTE — Telephone Encounter (Signed)
LMOVM to call to schedule lab appt. For repeat BHCG.

## 2013-06-12 NOTE — Telephone Encounter (Signed)
Message copied by Alphonsa Overall on Fri Jun 12, 2013 10:35 AM ------      Message from: Conley Simmonds      Created: Fri Jun 05, 2013  5:41 AM       Marchelle Folks,            Results to patient through My Chart.            She will repeat again in one week. ------

## 2013-06-17 NOTE — Telephone Encounter (Signed)
Spoke with patient and advised needs one more BHCG and pt. States she can come in on 06-19-13 in AM.  Made appt. For 10:10 for lab.

## 2013-06-19 ENCOUNTER — Other Ambulatory Visit: Payer: BC Managed Care – PPO

## 2013-06-22 ENCOUNTER — Telehealth: Payer: Self-pay | Admitting: Obstetrics and Gynecology

## 2013-06-22 NOTE — Telephone Encounter (Signed)
Patient dnka lab appointment 06/19/13. I left a message for the patient to call and reschedule.

## 2013-08-20 ENCOUNTER — Other Ambulatory Visit: Payer: Self-pay

## 2013-08-24 ENCOUNTER — Telehealth: Payer: Self-pay | Admitting: Obstetrics and Gynecology

## 2013-08-24 NOTE — Telephone Encounter (Signed)
patietn ready to schedule surgery, does not want hysterectomy but one of the "other procedures". Planning for early janumary. Advised needs OV to discuss with Dr Edward Jolly and plan what surgery to be done. OV/consult scheduled for 08-27-13 at 0930 (ultrasound spot).  Is this ok?

## 2013-08-24 NOTE — Telephone Encounter (Signed)
Looks like a good time slot.  Thanks.

## 2013-08-24 NOTE — Telephone Encounter (Signed)
pt is ready to schedule surgery

## 2013-08-27 ENCOUNTER — Ambulatory Visit (INDEPENDENT_AMBULATORY_CARE_PROVIDER_SITE_OTHER): Payer: BC Managed Care – PPO | Admitting: Obstetrics and Gynecology

## 2013-08-27 ENCOUNTER — Encounter: Payer: Self-pay | Admitting: Obstetrics and Gynecology

## 2013-08-27 VITALS — BP 130/70 | HR 80 | Wt 214.5 lb

## 2013-08-27 DIAGNOSIS — N920 Excessive and frequent menstruation with regular cycle: Secondary | ICD-10-CM

## 2013-08-27 LAB — CBC
HCT: 37 % (ref 36.0–46.0)
Hemoglobin: 12 g/dL (ref 12.0–15.0)
MCH: 25.9 pg — ABNORMAL LOW (ref 26.0–34.0)
MCHC: 32.4 g/dL (ref 30.0–36.0)
MCV: 79.9 fL (ref 78.0–100.0)
Platelets: 350 10*3/uL (ref 150–400)
RBC: 4.63 MIL/uL (ref 3.87–5.11)
RDW: 14.8 % (ref 11.5–15.5)
WBC: 4.9 10*3/uL (ref 4.0–10.5)

## 2013-08-27 NOTE — Patient Instructions (Signed)
Norethindrone tablets (contraception) What is this medicine? NORETHINDRONE (nor eth IN drone) is an oral contraceptive. The product contains a female hormone known as a progestin. It is used to prevent pregnancy. This medicine may be used for other purposes; ask your health care provider or pharmacist if you have questions. COMMON BRAND NAME(S): Camila, Errin , Heather, Jencycla, Jolivette , Lyza, Nor-QD, Nora-BE, Ortho Micronor What should I tell my health care provider before I take this medicine? They need to know if you have any of these conditions: -blood vessel disease or blood clots -breast, cervical, or vaginal cancer -diabetes -heart disease -kidney disease -liver disease -mental depression -migraine -seizures -stroke -vaginal bleeding -an unusual or allergic reaction to norethindrone, other medicines, foods, dyes, or preservatives -pregnant or trying to get pregnant -breast-feeding How should I use this medicine? Take this medicine by mouth with a glass of water. You may take it with or without food. Follow the directions on the prescription label. Take this medicine at the same time each day and in the order directed on the package. Do not take your medicine more often than directed. Contact your pediatrician regarding the use of this medicine in children. Special care may be needed. This medicine has been used in female children who have started having menstrual periods. A patient package insert for the product will be given with each prescription and refill. Read this sheet carefully each time. The sheet may change frequently. Overdosage: If you think you have taken too much of this medicine contact a poison control center or emergency room at once. NOTE: This medicine is only for you. Do not share this medicine with others. What if I miss a dose? Try not to miss a dose. Every time you miss a dose or take a dose late your chance of pregnancy increases. When 1 pill is missed  (even if only 3 hours late), take the missed pill as soon as possible and continue taking a pill each day at the regular time (use a back up method of birth control for the next 48 hours). If more than 1 dose is missed, use an additional birth control method for the rest of your pill pack until menses occurs. Contact your health care professional if more than 1 dose has been missed. What may interact with this medicine? Do not take this medicine with any of the following medications: -amprenavir or fosamprenavir -bosentan This medicine may also interact with the following medications: -antibiotics or medicines for infections, especially rifampin, rifabutin, rifapentine, and griseofulvin, and possibly penicillins or tetracyclines -aprepitant -barbiturate medicines, such as phenobarbital -carbamazepine -felbamate -modafinil -oxcarbazepine -phenytoin -ritonavir or other medicines for HIV infection or AIDS -St. John's wort -topiramate This list may not describe all possible interactions. Give your health care provider a list of all the medicines, herbs, non-prescription drugs, or dietary supplements you use. Also tell them if you smoke, drink alcohol, or use illegal drugs. Some items may interact with your medicine. What should I watch for while using this medicine? Visit your doctor or health care professional for regular checks on your progress. You will need a regular breast and pelvic exam and Pap smear while on this medicine. Use an additional method of birth control during the first cycle that you take these tablets. If you have any reason to think you are pregnant, stop taking this medicine right away and contact your doctor or health care professional. If you are taking this medicine for hormone related problems, it may take several   cycles of use to see improvement in your condition. This medicine does not protect you against HIV infection (AIDS) or any other sexually transmitted  diseases. What side effects may I notice from receiving this medicine? Side effects that you should report to your doctor or health care professional as soon as possible: -breast tenderness or discharge -pain in the abdomen, chest, groin or leg -severe headache -skin rash, itching, or hives -sudden shortness of breath -unusually weak or tired -vision or speech problems -yellowing of skin or eyes Side effects that usually do not require medical attention (report to your doctor or health care professional if they continue or are bothersome): -changes in sexual desire -change in menstrual flow -facial hair growth -fluid retention and swelling -headache -irritability -nausea -weight gain or loss This list may not describe all possible side effects. Call your doctor for medical advice about side effects. You may report side effects to FDA at 1-800-FDA-1088. Where should I keep my medicine? Keep out of the reach of children. Store at room temperature between 15 and 30 degrees C (59 and 86 degrees F). Throw away any unused medicine after the expiration date. NOTE: This sheet is a summary. It may not cover all possible information. If you have questions about this medicine, talk to your doctor, pharmacist, or health care provider.  2014, Elsevier/Gold Standard. (2012-06-20 16:41:35)  

## 2013-08-27 NOTE — Progress Notes (Signed)
Patient ID: Candace Graham, female   DOB: Jan 18, 1971, 42 y.o.   MRN: [id number]  GYNECOLOGY PROBLEM VISIT  PCP:   Referring provider:   HPI: 42 y.o.   Single  African American  female   [phone] with LMP 11/914 who presents with heavy menstrual bleeding. Patient did not present for final HCG following her termination of pregnancy.  Last HCG on 06/04/13 was 6.8.  Known uterine fibroids on prior ultrasound.  LMP 08/23/13 - spotting through now.  Prior mense was August or September 2014. No menses in October.  Last menses very heavy.  Pad change every hour. No dizziness or lightheadedness.  Some hip pain. Never started Ortho Micronor. Declines a hysterectomy. Had chest pains in the past with estrogen containing OCPs.  Used Norplant in the past.  Used it the full time.  Concerned about weight gain and bloating symptoms. Denies nausea or breast tenderness.  Declines future childbearing.   Sexually active with same partner. Not using contraception - uses withdrawal method.      OB History   Grav Para Term Preterm Abortions TAB SAB Ect Mult Living   5 2 2  2 2    1          Family History  Problem Relation Age of Onset  . Pancreatic cancer Mother   . Hypertension Mother   . Stroke Mother   . Thyroid disease Mother   . Hypertension Brother     Patient Active Problem List   Diagnosis Date Noted  . Anemia, iron deficiency 02/16/2013  . Fibroids 02/16/2013    Past Medical History  Diagnosis Date  . Abnormal uterine bleeding     Heavy menstrual cycles  . Anemia     No past surgical history on file.  ALLERGIES: Review of patient's allergies indicates no known allergies.  Current Outpatient Prescriptions  Medication Sig Dispense Refill  . norethindrone (MICRONOR,CAMILA,ERRIN) 0.35 MG tablet Take 1 tablet (0.35 mg total) by mouth daily.  3 Package  2   No current facility-administered medications for this visit.     ROS:  Pertinent items are noted in HPI.  SOCIAL  HISTORY:  Niece living with the patient now.   PHYSICAL EXAMINATION:    There were no vitals taken for this visit.   Wt Readings from Last 3 Encounters:  05/20/13 208 lb 8 oz (94.575 kg)  04/06/13 213 lb (96.616 kg)  01/29/13 213 lb (96.616 kg)     Ht Readings from Last 3 Encounters:  05/20/13 5\' 7"  (1.702 m)  04/06/13 5\' 7"  (1.702 m)  01/29/13 5\' 7"  (1.702 m)    General appearance: alert, cooperative and appears stated age Lungs: clear to auscultation bilaterally Heart: regular rate and rhythm Abdomen: soft, non-tender; no masses,  no organomegaly Extremities: extremities normal, atraumatic, no cyanosis or edema No abnormal inguinal nodes palpated Neurologic: Grossly normal  Pelvic: External genitalia:  no lesions              Urethra:  normal appearing urethra with no masses, tenderness or lesions              Bartholins and Skenes: normal                 Vagina: normal appearing vagina with normal color and discharge, no lesions              Cervix: normal appearance            Bimanual Exam:  Uterus:  uterus is  10 week size  nontender                                      Adnexa: normal adnexa in size, nontender and no masses                                        ASSESSMENT  Positive HCG following termination of pregnancy.  Symptomatic fibroids.   PLAN  Quantitative HCG today.  CBC. I discussed options for treatment of the fibroids - Micronor, Depo Provera, Mirena IUD, Nexplanon, hysterectomy, myomectomy, and uterine artery embolization.  Risks and benefits reviewed.  Patient opts for Ortho Micronor.  She already has a prescription for this and will wait to start until after we have called her with her quantitative HCG result. She understands the importance of this.  Return in 3 months for a recheck.  If has irregular bleeding, will need an endometrial biopsy.    25 minutes face to face time of which over 50% was spent in counseling.   An After Visit Summary was  printed and given to the patient.

## 2013-08-28 LAB — HCG, QUANTITATIVE, PREGNANCY: hCG, Beta Chain, Quant, S: <2 m[IU]/mL

## 2014-02-01 ENCOUNTER — Ambulatory Visit: Payer: BC Managed Care – PPO | Admitting: Obstetrics and Gynecology

## 2014-02-08 ENCOUNTER — Ambulatory Visit (INDEPENDENT_AMBULATORY_CARE_PROVIDER_SITE_OTHER): Payer: No Typology Code available for payment source | Admitting: Obstetrics and Gynecology

## 2014-02-08 ENCOUNTER — Encounter: Payer: Self-pay | Admitting: Obstetrics and Gynecology

## 2014-02-08 VITALS — BP 114/70 | HR 70 | Ht 67.5 in | Wt 217.0 lb

## 2014-02-08 DIAGNOSIS — Z23 Encounter for immunization: Secondary | ICD-10-CM

## 2014-02-08 DIAGNOSIS — Z01419 Encounter for gynecological examination (general) (routine) without abnormal findings: Secondary | ICD-10-CM

## 2014-02-08 DIAGNOSIS — R42 Dizziness and giddiness: Secondary | ICD-10-CM

## 2014-02-08 DIAGNOSIS — Z0000 Encounter for general adult medical examination without abnormal findings: Secondary | ICD-10-CM

## 2014-02-08 DIAGNOSIS — N912 Amenorrhea, unspecified: Secondary | ICD-10-CM

## 2014-02-08 LAB — COMPREHENSIVE METABOLIC PANEL
ALT: 31 U/L (ref 0–35)
AST: 26 U/L (ref 0–37)
Albumin: 3.9 g/dL (ref 3.5–5.2)
Alkaline Phosphatase: 76 U/L (ref 39–117)
BUN: 9 mg/dL (ref 6–23)
CO2: 28 mEq/L (ref 19–32)
Calcium: 9.5 mg/dL (ref 8.4–10.5)
Chloride: 106 mEq/L (ref 96–112)
Creat: 0.63 mg/dL (ref 0.50–1.10)
Glucose, Bld: 91 mg/dL (ref 70–99)
Potassium: 4.4 mEq/L (ref 3.5–5.3)
Sodium: 141 mEq/L (ref 135–145)
Total Bilirubin: 0.3 mg/dL (ref 0.2–1.2)
Total Protein: 7 g/dL (ref 6.0–8.3)

## 2014-02-08 LAB — POCT URINALYSIS DIPSTICK
Bilirubin, UA: NEGATIVE
Blood, UA: NEGATIVE
Glucose, UA: NEGATIVE
Ketones, UA: NEGATIVE
Leukocytes, UA: NEGATIVE
Nitrite, UA: NEGATIVE
Protein, UA: NEGATIVE
Urobilinogen, UA: NEGATIVE
pH, UA: 5

## 2014-02-08 LAB — CBC
HCT: 37.2 % (ref 36.0–46.0)
Hemoglobin: 12.2 g/dL (ref 12.0–15.0)
MCH: 25.7 pg — ABNORMAL LOW (ref 26.0–34.0)
MCHC: 32.8 g/dL (ref 30.0–36.0)
MCV: 78.3 fL (ref 78.0–100.0)
Platelets: 388 10*3/uL (ref 150–400)
RBC: 4.75 MIL/uL (ref 3.87–5.11)
RDW: 15.2 % (ref 11.5–15.5)
WBC: 4.7 10*3/uL (ref 4.0–10.5)

## 2014-02-08 LAB — LIPID PANEL
Cholesterol: 183 mg/dL (ref 0–200)
HDL: 48 mg/dL (ref >39)
LDL Cholesterol: 124 mg/dL — ABNORMAL HIGH (ref 0–99)
Total CHOL/HDL Ratio: 3.8 Ratio
Triglycerides: 54 mg/dL (ref <150)
VLDL: 11 mg/dL (ref 0–40)

## 2014-02-08 LAB — POCT URINE PREGNANCY: Preg Test, Ur: NEGATIVE

## 2014-02-08 LAB — HEMOGLOBIN, FINGERSTICK: Hemoglobin, fingerstick: 11.9 g/dL — ABNORMAL LOW (ref 12.0–16.0)

## 2014-02-08 MED ORDER — NORETHINDRONE 0.35 MG PO TABS: 1.0000 | ORAL_TABLET | Freq: Every day | ORAL | Status: DC

## 2014-02-08 NOTE — Progress Notes (Signed)
Appointment scheduled for bilateral screening mammogram at the [facility] on May 6th at 3:20pm. Patient agreeable to date and time. Previous mammogram from [facility] in 2013 faxed over to the [facility] per their request for appointment.

## 2014-02-08 NOTE — Patient Instructions (Addendum)
EXERCISE AND DIET:  We recommended that you start or continue a regular exercise program for good health. Regular exercise means any activity that makes your heart beat faster and makes you sweat.  We recommend exercising at least 30 minutes per day at least 3 days a week, preferably 4 or 5.  We also recommend a diet low in fat and sugar.  Inactivity, poor dietary choices and obesity can cause diabetes, heart attack, stroke, and kidney damage, among others.    ALCOHOL AND SMOKING:  Women should limit their alcohol intake to no more than 7 drinks/beers/glasses of wine (combined, not each!) per week. Moderation of alcohol intake to this level decreases your risk of breast cancer and liver damage. And of course, no recreational drugs are part of a healthy lifestyle.  And absolutely no smoking or even second hand smoke. Most people know smoking can cause heart and lung diseases, but did you know it also contributes to weakening of your bones? Aging of your skin?  Yellowing of your teeth and nails?  CALCIUM AND VITAMIN D:  Adequate intake of calcium and Vitamin D are recommended.  The recommendations for exact amounts of these supplements seem to change often, but generally speaking 600 mg of calcium (either carbonate or citrate) and 800 units of Vitamin D per day seems prudent. Certain women may benefit from higher intake of Vitamin D.  If you are among these women, your doctor will have told you during your visit.    PAP SMEARS:  Pap smears, to check for cervical cancer or precancers,  have traditionally been done yearly, although recent scientific advances have shown that most women can have pap smears less often.  However, every woman still should have a physical exam from her gynecologist every year. It will include a breast check, inspection of the vulva and vagina to check for abnormal growths or skin changes, a visual exam of the cervix, and then an exam to evaluate the size and shape of the uterus and  ovaries.  And after 44 years of age, a rectal exam is indicated to check for rectal cancers. We will also provide age appropriate advice regarding health maintenance, like when you should have certain vaccines, screening for sexually transmitted diseases, bone density testing, colonoscopy, mammograms, etc.   MAMMOGRAMS:  All women over 40 years old should have a yearly mammogram. Many facilities now offer a "3D" mammogram, which may cost around $50 extra out of pocket. If possible,  we recommend you accept the option to have the 3D mammogram performed.  It both reduces the number of women who will be called back for extra views which then turn out to be normal, and it is better than the routine mammogram at detecting truly abnormal areas.    COLONOSCOPY:  Colonoscopy to screen for colon cancer is recommended for all women at age 50.  We know, you hate the idea of the prep.  We agree, BUT, having colon cancer and not knowing it is worse!!  Colon cancer so often starts as a polyp that can be seen and removed at colonscopy, which can quite literally save your life!  And if your first colonoscopy is normal and you have no family history of colon cancer, most women don't have to have it again for 10 years.  Once every ten years, you can do something that may end up saving your life, right?  We will be happy to help you get it scheduled when you are ready.    Be sure to check your insurance coverage so you understand how much it will cost.  It may be covered as a preventative service at no cost, but you should check your particular policy.     Etonogestrel implant What is this medicine? ETONOGESTREL (et oh noe JES trel) is a contraceptive (birth control) device. It is used to prevent pregnancy. It can be used for up to 3 years. This medicine may be used for other purposes; ask your health care provider or pharmacist if you have questions. COMMON BRAND NAME(S): Implanon, Nexplanon  What should I tell my health  care provider before I take this medicine? They need to know if you have any of these conditions: -abnormal vaginal bleeding -blood vessel disease or blood clots -cancer of the breast, cervix, or liver -depression -diabetes -gallbladder disease -headaches -heart disease or recent heart attack -high blood pressure -high cholesterol -kidney disease -liver disease -renal disease -seizures -tobacco smoker -an unusual or allergic reaction to etonogestrel, other hormones, anesthetics or antiseptics, medicines, foods, dyes, or preservatives -pregnant or trying to get pregnant -breast-feeding How should I use this medicine? This device is inserted just under the skin on the inner side of your upper arm by a health care professional. Talk to your pediatrician regarding the use of this medicine in children. Special care may be needed. Overdosage: If you think you've taken too much of this medicine contact a poison control center or emergency room at once. Overdosage: If you think you have taken too much of this medicine contact a poison control center or emergency room at once. NOTE: This medicine is only for you. Do not share this medicine with others. What if I miss a dose? This does not apply. What may interact with this medicine? Do not take this medicine with any of the following medications: -amprenavir -bosentan -fosamprenavir This medicine may also interact with the following medications: -barbiturate medicines for inducing sleep or treating seizures -certain medicines for fungal infections like ketoconazole and itraconazole -griseofulvin -medicines to treat seizures like carbamazepine, felbamate, oxcarbazepine, phenytoin, topiramate -modafinil -phenylbutazone -rifampin -some medicines to treat HIV infection like atazanavir, indinavir, lopinavir, nelfinavir, tipranavir, ritonavir -St. John's wort This list may not describe all possible interactions. Give your health care  provider a list of all the medicines, herbs, non-prescription drugs, or dietary supplements you use. Also tell them if you smoke, drink alcohol, or use illegal drugs. Some items may interact with your medicine. What should I watch for while using this medicine? This product does not protect you against HIV infection (AIDS) or other sexually transmitted diseases. You should be able to feel the implant by pressing your fingertips over the skin where it was inserted. Tell your doctor if you cannot feel the implant. What side effects may I notice from receiving this medicine? Side effects that you should report to your doctor or health care professional as soon as possible: -allergic reactions like skin rash, itching or hives, swelling of the face, lips, or tongue -breast lumps -changes in vision -confusion, trouble speaking or understanding -dark urine -depressed mood -general ill feeling or flu-like symptoms -light-colored stools -loss of appetite, nausea -right upper belly pain -severe headaches -severe pain, swelling, or tenderness in the abdomen -shortness of breath, chest pain, swelling in a leg -signs of pregnancy -sudden numbness or weakness of the face, arm or leg -trouble walking, dizziness, loss of balance or coordination -unusual vaginal bleeding, discharge -unusually weak or tired -yellowing of the eyes or skin Side effects  that usually do not require medical attention (Report these to your doctor or health care professional if they continue or are bothersome.): -acne -breast pain -changes in weight -cough -fever or chills -headache -irregular menstrual bleeding -itching, burning, and vaginal discharge -pain or difficulty passing urine -sore throat This list may not describe all possible side effects. Call your doctor for medical advice about side effects. You may report side effects to FDA at 1-800-FDA-1088. Where should I keep my medicine? This drug is given in a  hospital or clinic and will not be stored at home. NOTE: This sheet is a summary. It may not cover all possible information. If you have questions about this medicine, talk to your doctor, pharmacist, or health care provider.  2014, Elsevier/Gold Standard. (2012-04-07 15:37:45)

## 2014-02-08 NOTE — Progress Notes (Signed)
Patient ID: Candace Graham, female   DOB: September 27, 1971, 43 y.o.   MRN: [id number] GYNECOLOGY VISIT  PCP:   None  Referring provider:   HPI: 43 y.o.   Single  African American  female   [id number] with Patient's last menstrual period was 01/24/2014.   here for   AEX. Known uterine fibroids.  Not taking birth control.  Did not start [place].  Skipping menses. No menses in February or March.   Menses returned in April.  Menses very heavy in January.  Cycles last 5 - 10 days.  Cramping before cycle, but not during bleeding.   Used Norplant in past.   Some dizziness and feels like she is in haze.  Taking iron, multivitamin, biotin.   Hgb:    11.9 Urine:  Neg  GYNECOLOGIC HISTORY: Patient's last menstrual period was 01/24/2014. Sexually active:  yes Partner preference: female Contraception:  None/rhythm  Menopausal hormone therapy: n/a DES exposure:   no Blood transfusions:   no Sexually transmitted diseases:  no  GYN procedures and prior surgeries:  no Last mammogram: 11/2011 bilateral dense breasts.  Mildly asymmetrical left breast tissue but no mass.  [place] 1cm mass 6cm from nipple at 2:00 position left breast.  Recommend f/u ultrasound in 6 months.  Follow up ultrasound 07-29-12 and diagnostic mammogram scattered fibroglandular densities.  Recommend screening mammogram in one year:  [facility].               Last pap and high risk HPV testing:   01-29-13 wnl:neg HR HPV. History of abnormal pap smear:  no   OB History   Grav Para Term Preterm Abortions TAB SAB Ect Mult Living   5 2 2  2 2    1        LIFESTYLE: Exercise:    no           Tobacco:   no Alcohol:     no Drug use:  no  OTHER HEALTH MAINTENANCE: Tetanus/TDap:   9-10 years ago Gardisil:              n/a Influenza:            never Zostavax:            n/a  Bone density:     n/a Colonoscopy:    n/a  Cholesterol check:   never  Family History  Problem Relation Age of Onset  .  Pancreatic cancer Mother   . Hypertension Mother   . Stroke Mother   . Thyroid disease Mother   . Hypertension Brother   . Hypertension Brother     Patient Active Problem List   Diagnosis Date Noted  . Anemia, iron deficiency 02/16/2013  . Fibroids 02/16/2013   Past Medical History  Diagnosis Date  . Abnormal uterine bleeding     Heavy menstrual cycles  . Anemia     History reviewed. No pertinent past surgical history.  ALLERGIES: Review of patient's allergies indicates no known allergies.  No current outpatient prescriptions on file.   No current facility-administered medications for this visit.     ROS:  Pertinent items are noted in HPI.  SOCIAL HISTORY:  Single.   PHYSICAL EXAMINATION:    BP 114/70  Pulse 70  Ht 5' 7.5" (1.715 m)  Wt 217 lb (98.431 kg)  BMI 33.47 kg/m2  LMP 01/24/2014   Wt Readings from Last 3 Encounters:  02/08/14 217 lb (98.431 kg)  08/27/13 214 lb 8 oz (  97.297 kg)  05/20/13 208 lb 8 oz (94.575 kg)     Ht Readings from Last 3 Encounters:  02/08/14 5' 7.5" (1.715 m)  05/20/13 5\' 7"  (1.702 m)  04/06/13 5\' 7"  (1.702 m)    General appearance: alert, cooperative and appears stated age Head: Normocephalic, without obvious abnormality, atraumatic Neck: no adenopathy, supple, symmetrical, trachea midline and thyroid not enlarged, symmetric, no tenderness/mass/nodules Lungs: clear to auscultation bilaterally Breasts: Inspection negative, No nipple retraction or dimpling, No nipple discharge or bleeding, No axillary or supraclavicular adenopathy, Normal to palpation without dominant masses Heart: regular rate and rhythm Abdomen: soft, non-tender; no masses,  no organomegaly Extremities: extremities normal, atraumatic, no cyanosis or edema Skin: Skin color, texture, turgor normal. No rashes or lesions Lymph nodes: Cervical, supraclavicular, and axillary nodes normal. No abnormal inguinal nodes palpated Neurologic: Grossly normal  Pelvic:  External genitalia:  no lesions              Urethra:  normal appearing urethra with no masses, tenderness or lesions              Bartholins and Skenes: normal                 Vagina: normal appearing vagina with normal color and discharge, no lesions              Cervix: normal appearance              Pap and high risk HPV testing done: no.            Bimanual Exam:  Uterus:  uterus is 12 week size, shape, consistency and nontender                                      Adnexa: normal adnexa in size, nontender and no masses                                      Rectovaginal: Confirms                                      Anus:  normal sphincter tone, no lesions  ASSESSMENT  Normal gynecologic exam. Uterine fibroids.  Dizziness.  PLAN  Mammogram recommended yearly.  Pap smear and high risk HPV testing not indicated.  FLP, CBC, CMP, TSH.  Rx for Camilla 3 months with 3 refills.  I reviewed proper use.   I also discussed Nexplanon with patient.  Not a good candidate for Mirena due to fibroids.  Counseled on self breast exam, Calcium and vitamin D intake, exercise, weight loss and proper dietary choices. TDap.  Return annually or prn   An After Visit Summary was printed and given to the patient.

## 2014-02-09 LAB — TSH: TSH: 2.092 u[IU]/mL (ref 0.350–4.500)

## 2014-02-17 ENCOUNTER — Ambulatory Visit: Payer: BC Managed Care – PPO

## 2014-02-17 ENCOUNTER — Ambulatory Visit
Admission: RE | Admit: 2014-02-17 | Discharge: 2014-02-17 | Disposition: A | Discharge status: Home / Self Care | Payer: BC Managed Care – PPO | Source: Ambulatory Visit | Attending: Obstetrics and Gynecology | Admitting: Obstetrics and Gynecology

## 2014-02-17 DIAGNOSIS — Z01419 Encounter for gynecological examination (general) (routine) without abnormal findings: Secondary | ICD-10-CM

## 2014-06-08 ENCOUNTER — Telehealth: Payer: Self-pay | Admitting: Obstetrics and Gynecology

## 2014-06-08 NOTE — Telephone Encounter (Signed)
Pt says she is having vaginal complications and would like an appointment.

## 2014-06-08 NOTE — Telephone Encounter (Signed)
Spoke with patient. Patient states that she has been having a "vaginal odor" for three to four days. Patient denies vaginal discharge, fever, pain, and urinary symptoms. Patient requesting appointment with Dr.Silva. Advised Dr.Silva does not have any available appointments until next week but I could get her in to see an NP if she would like. Patient requesting earliest appointment. Appointment scheduled for tomorrow at 1pm with Candace Graham CNM. Patient agreeable to date and time.  Cc: Dr.Silva  Routing to provider for final review. Patient agreeable to disposition. Will close encounter

## 2014-06-09 ENCOUNTER — Encounter: Payer: Self-pay | Admitting: Certified Nurse Midwife

## 2014-06-09 ENCOUNTER — Ambulatory Visit (INDEPENDENT_AMBULATORY_CARE_PROVIDER_SITE_OTHER): Payer: No Typology Code available for payment source | Admitting: Certified Nurse Midwife

## 2014-06-09 VITALS — BP 110/60 | HR 72 | Temp 98.2°F | Resp 18 | Wt 220.0 lb

## 2014-06-09 DIAGNOSIS — N760 Acute vaginitis: Secondary | ICD-10-CM

## 2014-06-09 DIAGNOSIS — N926 Irregular menstruation, unspecified: Secondary | ICD-10-CM

## 2014-06-09 MED ORDER — METRONIDAZOLE 0.75 % VA GEL: 1.0000 | Freq: Two times a day (BID) | VAGINAL | Status: DC

## 2014-06-09 NOTE — Progress Notes (Signed)
43 y.o.  Single african [place] female  [id number] here with complaint of vaginal symptoms of vaginal odor and increase discharge. Describes discharge as brown/red, starting period today.. Contraception none. Has stopped Camilla, but re starting with this period. Onset of symptoms 3-4 days ago. Denies new personal products or vaginal dryness. No STD concerns. Urinary symptoms none .   O:Healthy female WDWN Affect: normal, orientation x 3 POCT UPT negative Exam: Abdomen:soft. Lymph node: no enlargement or tenderness Pelvic exam: External genital: normal, no lesions BUS: negative Vagina: brown discharge with odor noted. Ph: 5.0  ,Wet prep taken Cervix: normal, non tender Uterus: enlarged approx. 12 week size history of fibroids per chart, no size change per chart Adnexa:normal, non tender, no masses or fullness noted   Wet Prep results:Clue cells noted   A:BV UPT negative on start of menses Enlarged Uterus history of fibroids   P:Discussed findings of BV and etiology. Discussed Aveeno or baking soda sitz bath for comfort. Avoid moist clothes or pads for extended period of time. If working out in gym clothes or swim suits for long periods of time change underwear or bottoms of swimsuit if possible. Olive Oil use for skin protection prior to activity can be used to external skin. Rx: Metrogel see order Discussed importance of consistent use of POP for contraception. Patient aware and will advise if problems.   Rv prn

## 2014-06-09 NOTE — Patient Instructions (Signed)
Exercise to Lose Weight Exercise and a healthy diet may help you lose weight. Your doctor may suggest specific exercises. EXERCISE IDEAS AND TIPS  Choose low-cost things you enjoy doing, such as walking, bicycling, or exercising to workout videos.  Take stairs instead of the elevator.  Walk during your lunch break.  Park your car further away from work or school.  Go to a gym or an exercise class.  Start with 5 to 10 minutes of exercise each day. Build up to 30 minutes of exercise 4 to 6 days a week.  Wear shoes with good support and comfortable clothes.  Stretch before and after working out.  Work out until you breathe harder and your heart beats faster.  Drink extra water when you exercise.  Do not do so much that you hurt yourself, feel dizzy, or get very short of breath. Exercises that burn about 150 calories:  Running 1  miles in 15 minutes.  Playing volleyball for 45 to 60 minutes.  Washing and waxing a car for 45 to 60 minutes.  Playing touch football for 45 minutes.  Walking 1  miles in 35 minutes.  Pushing a stroller 1  miles in 30 minutes.  Playing basketball for 30 minutes.  Raking leaves for 30 minutes.  Bicycling 5 miles in 30 minutes.  Walking 2 miles in 30 minutes.  Dancing for 30 minutes.  Shoveling snow for 15 minutes.  Swimming laps for 20 minutes.  Walking up stairs for 15 minutes.  Bicycling 4 miles in 15 minutes.  Gardening for 30 to 45 minutes.  Jumping rope for 15 minutes.  Washing windows or floors for 45 to 60 minutes. Document Released: 11/03/2010 Document Revised: 12/24/2011 Document Reviewed: 11/03/2010 ExitCare Patient Information 2015 [employer]. This information is not intended to replace advice given to you by your health care provider. Make sure you discuss any questions you have with your health care provider.  

## 2014-06-11 NOTE — Progress Notes (Signed)
Reviewed personally.  M. Suzanne Shakela Donati, MD.  

## 2014-06-28 ENCOUNTER — Encounter: Payer: Self-pay | Admitting: Obstetrics and Gynecology

## 2014-08-16 ENCOUNTER — Encounter: Payer: Self-pay | Admitting: Certified Nurse Midwife

## 2015-01-14 ENCOUNTER — Ambulatory Visit: Payer: No Typology Code available for payment source | Admitting: Obstetrics and Gynecology

## 2015-01-14 ENCOUNTER — Telehealth: Payer: Self-pay | Admitting: Obstetrics and Gynecology

## 2015-01-14 ENCOUNTER — Ambulatory Visit: Payer: BC Managed Care – PPO | Admitting: Obstetrics and Gynecology

## 2015-01-14 NOTE — Telephone Encounter (Signed)
Thank you for the update.  No missed appointment fee.

## 2015-01-14 NOTE — Telephone Encounter (Signed)
Pt called at 1130 stating she would be unable to make her appointment today (@ 1130). Pt states her son was in a car accident.  Pt will call at later date to reschedule.  Aex recall entered

## 2015-03-17 ENCOUNTER — Encounter: Payer: Self-pay | Admitting: Certified Nurse Midwife

## 2015-03-17 ENCOUNTER — Ambulatory Visit (INDEPENDENT_AMBULATORY_CARE_PROVIDER_SITE_OTHER): Payer: BLUE CROSS/BLUE SHIELD | Admitting: Certified Nurse Midwife

## 2015-03-17 VITALS — BP 110/72 | HR 70 | Resp 16 | Ht 67.25 in | Wt 227.0 lb

## 2015-03-17 DIAGNOSIS — Z0000 Encounter for general adult medical examination without abnormal findings: Secondary | ICD-10-CM | POA: Diagnosis not present

## 2015-03-17 DIAGNOSIS — Z01419 Encounter for gynecological examination (general) (routine) without abnormal findings: Secondary | ICD-10-CM

## 2015-03-17 DIAGNOSIS — N390 Urinary tract infection, site not specified: Secondary | ICD-10-CM | POA: Diagnosis not present

## 2015-03-17 DIAGNOSIS — Z124 Encounter for screening for malignant neoplasm of cervix: Secondary | ICD-10-CM | POA: Diagnosis not present

## 2015-03-17 DIAGNOSIS — N951 Menopausal and female climacteric states: Secondary | ICD-10-CM

## 2015-03-17 DIAGNOSIS — N912 Amenorrhea, unspecified: Secondary | ICD-10-CM | POA: Diagnosis not present

## 2015-03-17 LAB — POCT URINALYSIS DIPSTICK
Bilirubin, UA: NEGATIVE
Blood, UA: NEGATIVE
Glucose, UA: NEGATIVE
Ketones, UA: NEGATIVE
Leukocytes, UA: NEGATIVE
Nitrite, UA: NEGATIVE
Protein, UA: NEGATIVE
Urobilinogen, UA: NEGATIVE
pH, UA: 5

## 2015-03-17 LAB — LIPID PANEL
Cholesterol: 161 mg/dL (ref 0–200)
HDL: 43 mg/dL — ABNORMAL LOW (ref >=46)
LDL Cholesterol: 106 mg/dL — ABNORMAL HIGH (ref 0–99)
Total CHOL/HDL Ratio: 3.7 Ratio
Triglycerides: 58 mg/dL (ref <150)
VLDL: 12 mg/dL (ref 0–40)

## 2015-03-17 LAB — HEMOGLOBIN A1C
Hgb A1c MFr Bld: 6 % — ABNORMAL HIGH (ref <5.7)
Mean Plasma Glucose: 126 mg/dL — ABNORMAL HIGH (ref <117)

## 2015-03-17 LAB — COMPREHENSIVE METABOLIC PANEL
ALT: 23 U/L (ref 0–35)
AST: 25 U/L (ref 0–37)
Albumin: 3.7 g/dL (ref 3.5–5.2)
Alkaline Phosphatase: 79 U/L (ref 39–117)
BUN: 10 mg/dL (ref 6–23)
CO2: 25 mEq/L (ref 19–32)
Calcium: 8.7 mg/dL (ref 8.4–10.5)
Chloride: 108 mEq/L (ref 96–112)
Creat: 0.63 mg/dL (ref 0.50–1.10)
Glucose, Bld: 93 mg/dL (ref 70–99)
Potassium: 4.2 mEq/L (ref 3.5–5.3)
Sodium: 141 mEq/L (ref 135–145)
Total Bilirubin: 0.3 mg/dL (ref 0.2–1.2)
Total Protein: 6.4 g/dL (ref 6.0–8.3)

## 2015-03-17 LAB — TSH: TSH: 3.628 u[IU]/mL (ref 0.350–4.500)

## 2015-03-17 LAB — POCT URINE PREGNANCY: Preg Test, Ur: NEGATIVE

## 2015-03-17 NOTE — Progress Notes (Signed)
44 y.o. [id number] Single  African American Fe here for annual exam. No periods since 08/15/2014 which was very heavy and painful.. Occasional spotting no period. Last spotting episode ? 2 months ago. Complaining of hot flashes or night sweats and weight gain. Patient has noticed increased thirst and nocturia 2-3 times nightly but also drinks during the night due to thirst. No UTI symptoms, some backache, but feel related to fibroids or weight gain. Patient would also like to have screening labs, does not see PCP. Urgent care if needed only. Sexually active no STD screening needed. No other health issues today.  Patient's last menstrual period was 08/15/2014.          Sexually active: Yes.    The current method of family planning is withdrawal.   Exercising: Yes.    some walking.  Smoker:  no  Health Maintenance: Pap:  01-29-13 neg HPV HR neg MMG: 02-17-14 category b density,birads 1:neg Colonoscopy:  none BMD:   none TDaP: 2015 Labs: Poct urine-neg, Hgb-11.9, upt-neg Self breast exam: done occ   reports that she has never smoked. She has never used smokeless tobacco. She reports that she does not drink alcohol or use illicit drugs.  Past Medical History  Diagnosis Date  . Abnormal uterine bleeding     Heavy menstrual cycles  . Anemia     History reviewed. No pertinent past surgical history.  No current outpatient prescriptions on file.   No current facility-administered medications for this visit.    Family History  Problem Relation Age of Onset  . Pancreatic cancer Mother   . Hypertension Mother   . Stroke Mother   . Thyroid disease Mother   . Hypertension Brother   . Hypertension Brother     ROS:  Pertinent items are noted in HPI.  Otherwise, a comprehensive ROS was negative.  Exam:   BP 110/72 mmHg  Pulse 70  Resp 16  Ht 5' 7.25" (1.708 m)  Wt 227 lb (102.967 kg)  BMI 35.30 kg/m2  LMP 08/15/2014 Height: 5' 7.25" (170.8 cm) Ht Readings from Last 3 Encounters:   03/17/15 5' 7.25" (1.708 m)  02/08/14 5' 7.5" (1.715 m)  05/20/13 5\' 7"  (1.702 m)    General appearance: alert, cooperative and appears stated age Head: Normocephalic, without obvious abnormality, atraumatic Neck: no adenopathy, supple, symmetrical, trachea midline and thyroid normal to inspection and palpation Lungs: clear to auscultation bilaterally Breasts: normal appearance, no masses or tenderness, No nipple retraction or dimpling, No nipple discharge or bleeding, No axillary or supraclavicular adenopathy Heart: regular rate and rhythm Abdomen: soft, non-tender; no masses,  no organomegaly Extremities: extremities normal, atraumatic, no cyanosis or edema Skin: Skin color, texture, turgor normal. No rashes or lesions Lymph nodes: Cervical, supraclavicular, and axillary nodes normal. No abnormal inguinal nodes palpated Neurologic: Grossly normal   Pelvic: External genitalia:  no lesions              Urethra:  normal appearing urethra with no masses, tenderness or lesions              Bartholin's and Skene's: normal                 Vagina: normal appearing vagina with normal color and discharge, no lesions              Cervix: normal, non tender, no lesions              Pap taken: Yes.   Bimanual Exam:  Uterus:  enlarged, 12-14 weeks size              Adnexa: normal adnexa and no mass, fullness, tenderness               Rectovaginal: Confirms               Anus:  normal sphincter tone, no lesions  Chaperone present: Yes  A:  Well Woman with normal exam  Contraception withdrawal  Amenorrhea/perimenopausal symptoms  History of fibroids with ? Size change  Nocturia with increase thirst  Screening labs  P:   Reviewed health and wellness pertinent to exam  Discussed need to use condoms and withdrawal for the next month if so if she needs to do Provera challenge due to no menses. Negative UPT today. Discussed etiology of perimenopause and expectations with cycles. Questions  addressed. Discussed thyroid and pituitary and weight changes can also cause cycle changes.  Labs: Prolactin, TSH, FSH  Discussed ? Size change with fibroids and may need [place] to evaluate, patient agreeable.   Discussed drinking fluids after 6 pm will cause some nocturia, discussed urine culture to make sure no bacteria cause, patient agreeable. Also discussed increase thirst can be from diabetes or just needing more hydration. Will screen with her labs.  Labs:CMP,Lipid panel, Vitamin D, Hgb A1-c  Pap smear taken with HPV reflex   counseled on breast self exam, mammography screening, STD prevention, HIV risk factors and prevention, adequate intake of calcium and vitamin D, diet and exercise  return annually or prn  An After Visit Summary was printed and given to the patient.

## 2015-03-17 NOTE — Patient Instructions (Signed)
EXERCISE AND DIET:  We recommended that you start or continue a regular exercise program for good health. Regular exercise means any activity that makes your heart beat faster and makes you sweat.  We recommend exercising at least 30 minutes per day at least 3 days a week, preferably 4 or 5.  We also recommend a diet low in fat and sugar.  Inactivity, poor dietary choices and obesity can cause diabetes, heart attack, stroke, and kidney damage, among others.    ALCOHOL AND SMOKING:  Women should limit their alcohol intake to no more than 7 drinks/beers/glasses of wine (combined, not each!) per week. Moderation of alcohol intake to this level decreases your risk of breast cancer and liver damage. And of course, no recreational drugs are part of a healthy lifestyle.  And absolutely no smoking or even second hand smoke. Most people know smoking can cause heart and lung diseases, but did you know it also contributes to weakening of your bones? Aging of your skin?  Yellowing of your teeth and nails?  CALCIUM AND VITAMIN D:  Adequate intake of calcium and Vitamin D are recommended.  The recommendations for exact amounts of these supplements seem to change often, but generally speaking 600 mg of calcium (either carbonate or citrate) and 800 units of Vitamin D per day seems prudent. Certain women may benefit from higher intake of Vitamin D.  If you are among these women, your doctor will have told you during your visit.    PAP SMEARS:  Pap smears, to check for cervical cancer or precancers,  have traditionally been done yearly, although recent scientific advances have shown that most women can have pap smears less often.  However, every woman still should have a physical exam from her gynecologist every year. It will include a breast check, inspection of the vulva and vagina to check for abnormal growths or skin changes, a visual exam of the cervix, and then an exam to evaluate the size and shape of the uterus and  ovaries.  And after 45 years of age, a rectal exam is indicated to check for rectal cancers. We will also provide age appropriate advice regarding health maintenance, like when you should have certain vaccines, screening for sexually transmitted diseases, bone density testing, colonoscopy, mammograms, etc.   MAMMOGRAMS:  All women over 40 years old should have a yearly mammogram. Many facilities now offer a "3D" mammogram, which may cost around $50 extra out of pocket. If possible,  we recommend you accept the option to have the 3D mammogram performed.  It both reduces the number of women who will be called back for extra views which then turn out to be normal, and it is better than the routine mammogram at detecting truly abnormal areas.    COLONOSCOPY:  Colonoscopy to screen for colon cancer is recommended for all women at age 50.  We know, you hate the idea of the prep.  We agree, BUT, having colon cancer and not knowing it is worse!!  Colon cancer so often starts as a polyp that can be seen and removed at colonscopy, which can quite literally save your life!  And if your first colonoscopy is normal and you have no family history of colon cancer, most women don't have to have it again for 10 years.  Once every ten years, you can do something that may end up saving your life, right?  We will be happy to help you get it scheduled when you are ready.    Be sure to check your insurance coverage so you understand how much it will cost.  It may be covered as a preventative service at no cost, but you should check your particular policy.     Perimenopause Perimenopause is the time when your body begins to move into the menopause (no menstrual period for 12 straight months). It is a natural process. Perimenopause can begin 2-8 years before the menopause and usually lasts for 1 year after the menopause. During this time, your ovaries may or may not produce an egg. The ovaries vary in their production of estrogen and  progesterone hormones each month. This can cause irregular menstrual periods, difficulty getting pregnant, vaginal bleeding between periods, and uncomfortable symptoms. CAUSES  Irregular production of the ovarian hormones, estrogen and progesterone, and not ovulating every month.  Other causes include:  Tumor of the pituitary gland in the brain.  Medical disease that affects the ovaries.  Radiation treatment.  Chemotherapy.  Unknown causes.  Heavy smoking and excessive alcohol intake can bring on perimenopause sooner. SIGNS AND SYMPTOMS   Hot flashes.  Night sweats.  Irregular menstrual periods.  Decreased sex drive.  Vaginal dryness.  Headaches.  Mood swings.  Depression.  Memory problems.  Irritability.  Tiredness.  Weight gain.  Trouble getting pregnant.  The beginning of losing bone cells (osteoporosis).  The beginning of hardening of the arteries (atherosclerosis). DIAGNOSIS  Your health care provider will make a diagnosis by analyzing your age, menstrual history, and symptoms. He or she will do a physical exam and note any changes in your body, especially your female organs. Female hormone tests may or may not be helpful depending on the amount of female hormones you produce and when you produce them. However, other hormone tests may be helpful to rule out other problems. TREATMENT  In some cases, no treatment is needed. The decision on whether treatment is necessary during the perimenopause should be made by you and your health care provider based on how the symptoms are affecting you and your lifestyle. Various treatments are available, such as:  Treating individual symptoms with a specific medicine for that symptom.  Herbal medicines that can help specific symptoms.  Counseling.  Group therapy. HOME CARE INSTRUCTIONS   Keep track of your menstrual periods (when they occur, how heavy they are, how long between periods, and how long they last) as  well as your symptoms and when they started.  Only take over-the-counter or prescription medicines as directed by your health care provider.  Sleep and rest.  Exercise.  Eat a diet that contains calcium (good for your bones) and soy (acts like the estrogen hormone).  Do not smoke.  Avoid alcoholic beverages.  Take vitamin supplements as recommended by your health care provider. Taking vitamin E may help in certain cases.  Take calcium and vitamin D supplements to help prevent bone loss.  Group therapy is sometimes helpful.  Acupuncture may help in some cases. SEEK MEDICAL CARE IF:   You have questions about any symptoms you are having.  You need a referral to a specialist (gynecologist, psychiatrist, or psychologist). SEEK IMMEDIATE MEDICAL CARE IF:   You have vaginal bleeding.  Your period lasts longer than 8 days.  Your periods are recurring sooner than 21 days.  You have bleeding after intercourse.  You have severe depression.  You have pain when you urinate.  You have severe headaches.  You have vision problems. Document Released: 11/08/2004 Document Revised: 07/22/2013 Document Reviewed: 04/30/2013 ExitCare  Patient Information 2015 [employer]. This information is not intended to replace advice given to you by your health care provider. Make sure you discuss any questions you have with your health care provider.  

## 2015-03-18 ENCOUNTER — Telehealth: Payer: Self-pay

## 2015-03-18 ENCOUNTER — Other Ambulatory Visit: Payer: Self-pay | Admitting: Certified Nurse Midwife

## 2015-03-18 DIAGNOSIS — N912 Amenorrhea, unspecified: Secondary | ICD-10-CM

## 2015-03-18 DIAGNOSIS — R7309 Other abnormal glucose: Secondary | ICD-10-CM

## 2015-03-18 DIAGNOSIS — D259 Leiomyoma of uterus, unspecified: Secondary | ICD-10-CM

## 2015-03-18 LAB — PROLACTIN: Prolactin: 9.6 ng/mL

## 2015-03-18 LAB — URINE CULTURE
Colony Count: NO GROWTH
Organism ID, Bacteria: NO GROWTH

## 2015-03-18 LAB — FOLLICLE STIMULATING HORMONE: FSH: 39.3 m[IU]/mL

## 2015-03-18 LAB — VITAMIN D 25 HYDROXY (VIT D DEFICIENCY, FRACTURES): Vit D, 25-Hydroxy: 16 ng/mL — ABNORMAL LOW (ref 30–100)

## 2015-03-18 MED ORDER — VITAMIN D (ERGOCALCIFEROL) 1.25 MG (50000 UNIT) PO CAPS: 50000.0000 [IU] | ORAL_CAPSULE | ORAL | Status: DC

## 2015-03-18 NOTE — Progress Notes (Signed)
Agree with planning repeat PUS.  Reviewed personally.  Felipa Emory, MD.

## 2015-03-18 NOTE — Telephone Encounter (Signed)
-----   Message from Regina Eck, CNM sent at 03/18/2015 12:04 PM EDT ----- Notify patient that TSH is normal Lipid panel shows optimal level except for HDL(protective cholesterol) is low at 43, normal 48 work on exercise, weight loss as we discussed and healthy food intake  Re check at next aex. Vitamin D is very low needs Rx protocol Liver, kidney and glucose normal Hgb A1-c is elevated at 6.0 which puts her at greater risk of developing diabetes,normal is less that 5.7, needs referral in to PCP for management [facility] is low menopausal range so she may or may not have bleeding, needs to use condoms and withdrawal as we discussed so she can be treated with Provera as we discussed if no period in the next 2 weeks. She needs to be scheduled for HCG Qual. Order placed Prolactin is normal

## 2015-03-18 NOTE — Telephone Encounter (Signed)
Spoke with Regina Eck CNM. Patient needs to be seen for hcg qual in two weeks. This will need to be moved out further. Patient may see Dr.Karen Michail Sermon at [facility] @ [address]. Will need referral placed and appointment scheduled. Patient will also need follow up ultrasound in office with Dr.Miller per Regina Eck CNM.  Left message to call [employer] at [phone].

## 2015-03-18 NOTE — Telephone Encounter (Signed)
Spoke with patient. Advised patient of results as seen below. Patient is agreeable and verbalizes understanding. Rx for Vitamin D 50,000 IU take one tablet every 7 days #12 0RF sent to [facility] on file. Patient is agreeable. Vitamin D recheck scheduled for 9/8 at 1:30pm. Patient does not have PCP and would like a referral to female provider in the area. Advised will speak with Regina Eck CNM regarding referral and will set up appointment and return call. Patient is agreeable. Lab appointment scheduled for HCG qual on 6/7 at 1:30pm. Patient is agreeable to date and time.  Regina Eck CNM is there a female PCP you recommend patient see in the area? Will place referral and set up appointment for patient.

## 2015-03-19 LAB — IPS PAP TEST WITH REFLEX TO HPV

## 2015-03-21 LAB — HEMOGLOBIN, FINGERSTICK: Hemoglobin, fingerstick: 11.9 g/dL — ABNORMAL LOW (ref 12.0–16.0)

## 2015-03-21 NOTE — Telephone Encounter (Signed)
Spoke with patient. Appointment for PUS scheduled with Dr.Miller for 6/16 at 2pm with 2:30pm consult with Dr.Miller. Patient is agreeable to date and time. Patient will have hcg qual performed at this appointment as it will be exactly 2 weeks from her appointment on 6/2. Advised referral will be placed to Dr.Karen Michail Sermon at [facility] Internal medicine at [facility] and she will be contacted regarding appointment date and time. Patient is agreeable.  Cc: Candace Graham for referral  Routing to provider for final review.

## 2015-03-22 ENCOUNTER — Other Ambulatory Visit: Payer: BLUE CROSS/BLUE SHIELD

## 2015-03-29 NOTE — Telephone Encounter (Signed)
Regina Eck CNM, agreeable with plan? Okay to close encounter?

## 2015-03-30 NOTE — Telephone Encounter (Signed)
Agree can close encounter

## 2015-03-31 ENCOUNTER — Ambulatory Visit (INDEPENDENT_AMBULATORY_CARE_PROVIDER_SITE_OTHER): Payer: BLUE CROSS/BLUE SHIELD | Admitting: Obstetrics & Gynecology

## 2015-03-31 ENCOUNTER — Other Ambulatory Visit: Payer: BLUE CROSS/BLUE SHIELD

## 2015-03-31 ENCOUNTER — Telehealth: Payer: Self-pay | Admitting: Obstetrics & Gynecology

## 2015-03-31 ENCOUNTER — Encounter: Payer: Self-pay | Admitting: Obstetrics & Gynecology

## 2015-03-31 DIAGNOSIS — N852 Hypertrophy of uterus: Secondary | ICD-10-CM

## 2015-03-31 NOTE — Progress Notes (Signed)
Opened in error

## 2015-03-31 NOTE — Telephone Encounter (Addendum)
Patient arrived to her 2:00pm PUS/Consult at 2:38pm. Patient rescheduled to 04/05/15 at 1:00pm.

## 2015-04-05 ENCOUNTER — Ambulatory Visit: Payer: BLUE CROSS/BLUE SHIELD

## 2015-04-05 ENCOUNTER — Ambulatory Visit (INDEPENDENT_AMBULATORY_CARE_PROVIDER_SITE_OTHER): Payer: BLUE CROSS/BLUE SHIELD | Admitting: Obstetrics & Gynecology

## 2015-04-05 VITALS — BP 110/80 | HR 76 | Resp 14 | Wt 225.0 lb

## 2015-04-05 DIAGNOSIS — D259 Leiomyoma of uterus, unspecified: Secondary | ICD-10-CM

## 2015-04-05 DIAGNOSIS — N9489 Other specified conditions associated with female genital organs and menstrual cycle: Secondary | ICD-10-CM

## 2015-04-05 DIAGNOSIS — D251 Intramural leiomyoma of uterus: Secondary | ICD-10-CM

## 2015-04-05 DIAGNOSIS — N924 Excessive bleeding in the premenopausal period: Secondary | ICD-10-CM

## 2015-04-05 NOTE — Progress Notes (Signed)
After office visit with Dr Sabra Heck, patient requests to proceed with scheduling of surgery. Hysteroscopy/Polyp resection/D&C schedule for 04-25-15 at 1100 at [facility]. Surgical instruction sheet reviewed and printed copy given to patient.

## 2015-04-07 ENCOUNTER — Telehealth: Payer: Self-pay | Admitting: Obstetrics & Gynecology

## 2015-04-07 NOTE — Telephone Encounter (Signed)
Called patient with referral appointment information. Patient referred to [facility] primary care for A1C management.   Appointment with Terri Piedra @ [facility] on July 14th @ 1pm. Next available female MD is in mid august and original provider requested Gwendolyn Grant) is not taking on new patients. [place], [place], [place] [address]  [phone]  If this appointment is not agreeable for you, [place] prefers you call their office to reschedule.

## 2015-04-13 ENCOUNTER — Encounter (HOSPITAL_COMMUNITY): Payer: Self-pay | Admitting: *Deleted

## 2015-04-19 ENCOUNTER — Encounter: Payer: Self-pay | Admitting: Obstetrics & Gynecology

## 2015-04-19 ENCOUNTER — Other Ambulatory Visit: Payer: Self-pay | Admitting: Obstetrics & Gynecology

## 2015-04-19 DIAGNOSIS — N9489 Other specified conditions associated with female genital organs and menstrual cycle: Secondary | ICD-10-CM | POA: Insufficient documentation

## 2015-04-19 DIAGNOSIS — N924 Excessive bleeding in the premenopausal period: Secondary | ICD-10-CM | POA: Insufficient documentation

## 2015-04-19 NOTE — Progress Notes (Signed)
Patient ID: Candace Graham, female   DOB: 08/18/1971, 44 y.o.   MRN: [id number]  44 y.o. G4P2021Singlefemale here for a pelvic ultrasound due to [facility] of [facility] 39 with spotting off and on over the past several months.  Last cycle was 11/15 and was heavy.  Since then, she really has not had any regular bleeding like a typical cycle, just some irregular spotting.  Pt denies cramping or pain.  She has known hx of fibroids and has prior ultrasound images that can be used for comparison.     Patient's last menstrual period was 08/15/2014.  Sexually active:  yes  Contraception: withdrawal method  FINDINGS: UTERUS: 10.1 x 6.2 x 6.4cm with multiple fibroids, at least eight mostly measuring 1-2cm with the largest measuring 3.9 cm.  All are intramural.  Really no change compared to prior images. EMS: 8.51mm with intracavitary lesion consistent with polyp ADNEXA:   Left ovary 2.8 x 1.1 x 1.5cm   Right ovary 2.9 x 1.5 x 4.0JW with simple follicle measuring 1.1BJ, avascular CUL DE SAC: no free fluid  Findings reviewed.  Pt and partner are not interested in anything permament at this time.  Have considered pregnancy in the past and pt did have a miscarriage in the last two years.  Probable polyp vs fibroid discussed.  As bleeding is only intermittent spotting, this could be the source.  Pt is aware fibroids could be contributing to this.  As pt does not want anything really aggressive done, hysteroscopy with polyp resection, possible fibroid resection, discussed.    Procedure discussed with patient.  Recovery and pain management discussed.  Risks discussed including but not limited to bleeding, rare risk of transfusion, infection, 1% risk of uterine perforation with risks of fluid deficit causing cardiac arrythmia, cerebral swelling and/or need to stop procedure early.  Fluid emboli and rare risk of death discussed.  DVT/PE, rare risk of risk of bowel/bladder/ureteral/vascular injury.  Patient aware if pathology  abnormal she may need additional treatment.  All questions answered.    Pt and partner are desirous of going ahead and getting procedure scheduled as well as proceeding.  Assessment:  Perimenopausal spotting, DUB, known uterine fibroids, endometrial mass--probable polyp vs fibroid, h/o anemia Plan: Hysteroscopy with polyp resection, D&C will be planned.    ~30 minutes spent with patient >50% of time was in face to face discussion of above.

## 2015-04-22 ENCOUNTER — Encounter: Payer: Self-pay | Admitting: Certified Nurse Midwife

## 2015-04-25 ENCOUNTER — Ambulatory Visit (HOSPITAL_COMMUNITY)
Admission: RE | Admit: 2015-04-25 | Discharge: 2015-04-25 | Disposition: A | Discharge status: Home / Self Care | Payer: BLUE CROSS/BLUE SHIELD | Source: Ambulatory Visit | Attending: Obstetrics & Gynecology | Admitting: Obstetrics & Gynecology

## 2015-04-25 ENCOUNTER — Encounter (HOSPITAL_COMMUNITY)
Admission: RE | Disposition: A | Discharge status: Home / Self Care | Payer: Self-pay | Source: Ambulatory Visit | Attending: Obstetrics & Gynecology

## 2015-04-25 ENCOUNTER — Encounter (HOSPITAL_COMMUNITY): Payer: Self-pay | Admitting: *Deleted

## 2015-04-25 ENCOUNTER — Ambulatory Visit (HOSPITAL_COMMUNITY): Payer: BLUE CROSS/BLUE SHIELD | Admitting: Anesthesiology

## 2015-04-25 DIAGNOSIS — N938 Other specified abnormal uterine and vaginal bleeding: Secondary | ICD-10-CM | POA: Diagnosis not present

## 2015-04-25 DIAGNOSIS — N840 Polyp of corpus uteri: Secondary | ICD-10-CM

## 2015-04-25 DIAGNOSIS — N924 Excessive bleeding in the premenopausal period: Secondary | ICD-10-CM | POA: Diagnosis not present

## 2015-04-25 HISTORY — PX: DILATATION & CURRETTAGE/HYSTEROSCOPY WITH RESECTOCOPE: SHX5572

## 2015-04-25 LAB — CBC
HCT: 35 % — ABNORMAL LOW (ref 36.0–46.0)
Hemoglobin: 11.3 g/dL — ABNORMAL LOW (ref 12.0–15.0)
MCH: 25.2 pg — ABNORMAL LOW (ref 26.0–34.0)
MCHC: 32.3 g/dL (ref 30.0–36.0)
MCV: 78.1 fL (ref 78.0–100.0)
Platelets: 300 10*3/uL (ref 150–400)
RBC: 4.48 MIL/uL (ref 3.87–5.11)
RDW: 14.5 % (ref 11.5–15.5)
WBC: 5.5 10*3/uL (ref 4.0–10.5)

## 2015-04-25 LAB — HCG, SERUM, QUALITATIVE: Preg, Serum: NEGATIVE

## 2015-04-25 SURGERY — DILATATION & CURETTAGE/HYSTEROSCOPY WITH RESECTOCOPE
Anesthesia: Choice

## 2015-04-25 MED ORDER — PROPOFOL 10 MG/ML IV BOLUS
INTRAVENOUS | Status: DC | PRN
  Administered 2015-04-25: 200 mg via INTRAVENOUS

## 2015-04-25 MED ORDER — GLYCINE 1.5 % IR SOLN
Status: DC | PRN
  Administered 2015-04-25: 3000 mL

## 2015-04-25 MED ORDER — DEXAMETHASONE SODIUM PHOSPHATE 4 MG/ML IJ SOLN
INTRAMUSCULAR | Status: AC
  Filled 2015-04-25: qty 1

## 2015-04-25 MED ORDER — CEFAZOLIN SODIUM-DEXTROSE 2-3 GM-% IV SOLR
INTRAVENOUS | Status: AC
  Filled 2015-04-25: qty 50

## 2015-04-25 MED ORDER — ONDANSETRON HCL 4 MG/2ML IJ SOLN
INTRAMUSCULAR | Status: DC | PRN
  Administered 2015-04-25: 4 mg via INTRAVENOUS

## 2015-04-25 MED ORDER — MIDAZOLAM HCL 2 MG/2ML IJ SOLN
INTRAMUSCULAR | Status: DC | PRN
  Administered 2015-04-25: 2 mg via INTRAVENOUS

## 2015-04-25 MED ORDER — FENTANYL CITRATE (PF) 100 MCG/2ML IJ SOLN
INTRAMUSCULAR | Status: DC | PRN
  Administered 2015-04-25: 50 ug via INTRAVENOUS
  Administered 2015-04-25: 100 ug via INTRAVENOUS

## 2015-04-25 MED ORDER — KETOROLAC TROMETHAMINE 30 MG/ML IJ SOLN
INTRAMUSCULAR | Status: DC | PRN
  Administered 2015-04-25: 30 mg via INTRAMUSCULAR
  Administered 2015-04-25: 30 mg via INTRAVENOUS

## 2015-04-25 MED ORDER — SCOPOLAMINE 1 MG/3DAYS TD PT72
1.0000 | MEDICATED_PATCH | Freq: Once | TRANSDERMAL | Status: DC
  Administered 2015-04-25: 1.5 mg via TRANSDERMAL

## 2015-04-25 MED ORDER — ONDANSETRON HCL 4 MG/2ML IJ SOLN
INTRAMUSCULAR | Status: AC
  Filled 2015-04-25: qty 2

## 2015-04-25 MED ORDER — MIDAZOLAM HCL 2 MG/2ML IJ SOLN
INTRAMUSCULAR | Status: AC
  Filled 2015-04-25: qty 2

## 2015-04-25 MED ORDER — SCOPOLAMINE 1 MG/3DAYS TD PT72
MEDICATED_PATCH | TRANSDERMAL | Status: AC
  Filled 2015-04-25: qty 1

## 2015-04-25 MED ORDER — FENTANYL CITRATE (PF) 250 MCG/5ML IJ SOLN
INTRAMUSCULAR | Status: AC
  Filled 2015-04-25: qty 5

## 2015-04-25 MED ORDER — FENTANYL CITRATE (PF) 100 MCG/2ML IJ SOLN: 25.0000 ug | INTRAMUSCULAR | Status: DC | PRN

## 2015-04-25 MED ORDER — CEFAZOLIN SODIUM-DEXTROSE 2-3 GM-% IV SOLR
2.0000 g | INTRAVENOUS | Status: AC
  Administered 2015-04-25: 2 g via INTRAVENOUS

## 2015-04-25 MED ORDER — LIDOCAINE HCL (CARDIAC) 20 MG/ML IV SOLN
INTRAVENOUS | Status: AC
  Filled 2015-04-25: qty 5

## 2015-04-25 MED ORDER — LIDOCAINE-EPINEPHRINE 1 %-1:100000 IJ SOLN
INTRAMUSCULAR | Status: AC
  Filled 2015-04-25: qty 1

## 2015-04-25 MED ORDER — PROPOFOL 10 MG/ML IV BOLUS
INTRAVENOUS | Status: AC
  Filled 2015-04-25: qty 20

## 2015-04-25 MED ORDER — IBUPROFEN 800 MG PO TABS: 800.0000 mg | ORAL_TABLET | Freq: Three times a day (TID) | ORAL | Status: DC | PRN

## 2015-04-25 MED ORDER — LIDOCAINE HCL (CARDIAC) 20 MG/ML IV SOLN
INTRAVENOUS | Status: DC | PRN
  Administered 2015-04-25: 50 mg via INTRAVENOUS

## 2015-04-25 MED ORDER — LIDOCAINE-EPINEPHRINE 1 %-1:100000 IJ SOLN
INTRAMUSCULAR | Status: DC | PRN
  Administered 2015-04-25: 10 mL

## 2015-04-25 MED ORDER — HYDROCODONE-ACETAMINOPHEN 5-325 MG PO TABS: 1.0000 | ORAL_TABLET | Freq: Four times a day (QID) | ORAL | Status: DC | PRN

## 2015-04-25 MED ORDER — LACTATED RINGERS IV SOLN
INTRAVENOUS | Status: DC
  Administered 2015-04-25: 10:00:00 via INTRAVENOUS

## 2015-04-25 MED ORDER — DEXAMETHASONE SODIUM PHOSPHATE 10 MG/ML IJ SOLN
INTRAMUSCULAR | Status: DC | PRN
  Administered 2015-04-25: 4 mg via INTRAVENOUS

## 2015-04-25 SURGICAL SUPPLY — 18 items
CANISTER SUCT 3000ML (MISCELLANEOUS) ×2 IMPLANT
CATH ROBINSON RED A/P 16FR (CATHETERS) ×2 IMPLANT
CLOTH BEACON ORANGE TIMEOUT ST (SAFETY) ×2 IMPLANT
CONTAINER PREFILL 10% NBF 60ML (FORM) ×4 IMPLANT
DILATOR CANAL MILEX (MISCELLANEOUS) IMPLANT
ELECT REM PT RETURN 9FT ADLT (ELECTROSURGICAL) ×2
ELECTRODE REM PT RTRN 9FT ADLT (ELECTROSURGICAL) ×1 IMPLANT
GLOVE BIOGEL PI IND STRL 7.0 (GLOVE) ×1 IMPLANT
GLOVE BIOGEL PI INDICATOR 7.0 (GLOVE) ×1
GLOVE ECLIPSE 6.5 STRL STRAW (GLOVE) ×4 IMPLANT
GOWN STRL REUS W/TWL LRG LVL3 (GOWN DISPOSABLE) ×4 IMPLANT
LOOP ANGLED CUTTING 22FR (CUTTING LOOP) ×2 IMPLANT
PACK VAGINAL MINOR WOMEN LF (CUSTOM PROCEDURE TRAY) ×2 IMPLANT
PAD OB MATERNITY 4.3X12.25 (PERSONAL CARE ITEMS) ×2 IMPLANT
TOWEL OR 17X24 6PK STRL BLUE (TOWEL DISPOSABLE) ×4 IMPLANT
TUBING AQUILEX INFLOW (TUBING) ×2 IMPLANT
TUBING AQUILEX OUTFLOW (TUBING) ×2 IMPLANT
WATER STERILE IRR 1000ML POUR (IV SOLUTION) ×2 IMPLANT

## 2015-04-25 NOTE — Op Note (Signed)
04/25/2015  11:26 AM  PATIENT:  Candace Graham  44 y.o. female  PRE-OPERATIVE DIAGNOSIS:  endometrial polyp, DUB, uterine fibroids  POST-OPERATIVE DIAGNOSIS:  endometrial polyp, DUB, uterine fibroids  PROCEDURE:  Procedure(s): DILATATION & CURETTAGE/HYSTEROSCOPY WITH RESECTOCOPE  SURGEON:  Evynn Boutelle SUZANNE  ASSISTANTS: OR staff   ANESTHESIA:  LMA  ESTIMATED BLOOD LOSS: 10cc  BLOOD ADMINISTERED:none   FLUIDS: 800cc LR  UOP: 25cc drained with I&O cath at beginning of procedure  SPECIMEN:  Endometrial polyp and curettings  DISPOSITION OF SPECIMEN:  PATHOLOGY  FINDINGS: endometrial polyp on right, anterior side of endometrial cavity, thickened appearing endometrium  DESCRIPTION OF OPERATION: Patient was taken to the operating room.  She is placed in the supine position. SCDs were on her lower extremities and functioning properly. General anesthesia with an LMA was administered without difficulty. Dr. Lyndle Herrlich oversaw case.  Legs were then placed in the [place] in the low lithotomy position. The legs were lifted to the high lithotomy position and the Betadine prep was used on the inner thighs perineum and vagina x3. Patient was draped in a normal standard fashion. An in and out catheterization with a red rubber Foley catheter was performed. Approximately 25 cc of clear urine was noted. A bivalve speculum was placed the vagina. The anterior lip of the cervix was grasped with single-tooth tenaculum.  A paracervical block of 1% lidocaine mixed one-to-one with epinephrine (1:100,000 units).  10 cc was used total. The endometrial cavity sounded to 8 cm.  The cervix is dilated up to #27 [facility] dilators. .   A 2.9 millimeter diagnostic hysteroscope with resectoscope attached was obtained. 1.5% glycine was used as a hysteroscopic fluid. The hysteroscope was advanced through the endocervical canal into the endometrial cavity. The tubal ostium was noted on the left side but not  the right side.  A single polyp with fluffy appearing endometrium was noted.  Using a resectoscope with a single loop, the polyp was removed with several passes.  The hysteroscope was removed. Using a #1 smooth curette, the cavity was curetted until a rough, gritty texture was noted in all quadrants.  There was a fair amount of endometrial tissue noted.   The cavity was re visualized and the endometrium was much thinner at this point.  The polyp was fully excised.  At this point the procedure was ended. The hysteroscope was removed. The fluid deficit was 165 cc. The tenaculum was removed from the anterior lip of the cervix. The speculum was returned vagina. The prep was cleansed of the patient's skin. The legs are positioned back in the supine position. Sponge, lap, needle, initially counts were correct x2. Patient was taken to recovery in stable condition.  COUNTS:  YES  PLAN OF CARE: Transfer to PACU

## 2015-04-25 NOTE — Anesthesia Procedure Notes (Signed)
Procedure Name: LMA Insertion Date/Time: 04/25/2015 10:49 AM Performed by: Jonna Munro Pre-anesthesia Checklist: Patient identified, Emergency Drugs available, Suction available, Timeout performed and Patient being monitored Patient Re-evaluated:Patient Re-evaluated prior to inductionOxygen Delivery Method: Circle system utilized Preoxygenation: Pre-oxygenation with 100% oxygen Intubation Type: IV induction Ventilation: Mask ventilation without difficulty LMA: LMA inserted LMA Size: 4.0 Number of attempts: 1 Placement Confirmation: positive ETCO2 and breath sounds checked- equal and bilateral Tube secured with: Tape Dental Injury: Teeth and Oropharynx as per pre-operative assessment

## 2015-04-25 NOTE — Discharge Instructions (Signed)
Post-surgical Instructions, Outpatient Surgery  You may expect to feel dizzy, weak, and drowsy for as long as 24 hours after receiving the medicine that made you sleep (anesthetic). For the first 24 hours after your surgery:    Do not drive a car, ride a bicycle, participate in physical activities, or take public transportation until you are done taking narcotic pain medicines or as directed by Dr. Sabra Heck.   Do not drink alcohol or take tranquilizers.   Do not take medicine that has not been prescribed by your physicians.   Do not sign important papers or make important decisions while on narcotic pain medicines.   Have a responsible person with you.   PAIN MANAGEMENT  Motrin 800mg .  (This is the same as 4-200mg  over the counter tablets of Motrin or ibuprofen.)  You may take this every eight hours or as needed for cramping.    Vicodin 5/325mg .  For more severe pain, take one or two tablets every four to six hours as needed for pain control.  (Remember that narcotic pain medications increase your risk of constipation.  If this becomes a problem, you may take an over the counter stool softener like Colace 100mg  up to four times a day.)  DO'S AND DON'T'S  Do not take a tub bath for one week.  You may shower on the first day after your surgery  Do not do any heavy lifting for one to two weeks.  This increases the chance of bleeding.  Do move around as you feel able.  Stairs are fine.  You may begin to exercise again as you feel able.  Do not lift any weights for two weeks.  Do not put anything in the vagina for two weeks--no tampons, intercourse, or douching.    Do remove the patch behind your ear in 1-2 days after your surgery.  Make sure to wash your hands after it is removed.  REGULAR MEDIATIONS/VITAMINS:  You may restart all of your regular medications as prescribed.  You may restart all of your vitamins as you normally take them.    PLEASE CALL OR SEEK MEDICAL CARE IF:  You  have persistent nausea and vomiting.   You have trouble eating or drinking.   You have an oral temperature above 100.5.   You have constipation that is not helped by adjusting diet or increasing fluid intake. Pain medicines are a common cause of constipation.   You have heavy vaginal bleeding  You have redness or drainage from your incision(s) or there is increasing pain or tenderness near or in the surgical site.

## 2015-04-25 NOTE — H&P (Signed)
Candace Graham is an 44 y.o. female G14P2 SAAF here for hysteroscopy with polyp resection due to DUB.  Pt has not had "real cycles" since 11/15 and is just having intermittent spotting.  Pt has know hx of fibroids bur ultrasound on 04/05/15 did not show enlarged fibroids just a intracavity polyp vs fibroid.  Stuart done 03/17/15 was 39.  Pt is not interested in aggressive therapy and does not want anything done that will affect the possibility of pregnancy if she and partner decided to proceed with any infertility treatments.  They are not currently planning to pursue this but she wants to leave options open.  Due to endometrial mass and spotting/DUB, hysteroscopy with polyp/fibroid resection was discussed.  Risks and benefits have been reviewed.  Pt here and ready to proceed.  Pertinent Gynecological History: Menses: DUB Bleeding: dysfunctional uterine bleeding Contraception: none DES exposure: denies Blood transfusions: none Sexually transmitted diseases: no past history Previous GYN Procedures: none  Last mammogram: normal Date: 5/15 Last pap: normal Date: 03/17/15 OB History: G4, P2   Menstrual History: Patient's last menstrual period was 08/15/2014.    Past Medical History  Diagnosis Date  . Abnormal uterine bleeding     Heavy menstrual cycles  . Anemia   . Vaginal delivery 1991, 1999    Past Surgical History  Procedure Laterality Date  . No past surgeries      Family History  Problem Relation Age of Onset  . Pancreatic cancer Mother   . Hypertension Mother   . Stroke Mother   . Thyroid disease Mother   . Hypertension Brother   . Hypertension Brother     Social History:  reports that she has never smoked. She has never used smokeless tobacco. She reports that she does not drink alcohol or use illicit drugs.  Allergies: No Known Allergies  Prescriptions prior to admission  Medication Sig Dispense Refill Last Dose  . OVER THE COUNTER MEDICATION Take 1 capsule by mouth  daily as needed (For constipation.). Patient takes natural laxative supplement Prunex.   Past Week at Unknown time  . Vitamin D, Ergocalciferol, (DRISDOL) 50000 UNITS CAPS capsule Take 1 capsule (50,000 Units total) by mouth every 7 (seven) days. 12 capsule 0 Past Week at Unknown time    Review of Systems  All other systems reviewed and are negative.   Blood pressure 125/76, pulse 88, temperature 98.1 F (36.7 C), temperature source Oral, resp. rate 16, height 5\' 7"  (1.702 m), last menstrual period 08/15/2014, SpO2 99 %. Physical Exam  Constitutional: She is oriented to person, place, and time. She appears well-developed and well-nourished.  Cardiovascular: Normal rate and regular rhythm.   Respiratory: Effort normal and breath sounds normal.  Neurological: She is alert and oriented to person, place, and time.  Skin: Skin is warm and dry.  Psychiatric: She has a normal mood and affect.    Results for orders placed or performed during the hospital encounter of 04/25/15 (from the past 24 hour(s))  hCG, serum, qualitative     Status: None   Collection Time: 04/25/15  9:38 AM  Result Value Ref Range   Preg, Serum NEGATIVE NEGATIVE  CBC     Status: Abnormal   Collection Time: 04/25/15  9:38 AM  Result Value Ref Range   WBC 5.5 4.0 - 10.5 K/uL   RBC 4.48 3.87 - 5.11 MIL/uL   Hemoglobin 11.3 (L) 12.0 - 15.0 g/dL   HCT 35.0 (L) 36.0 - 46.0 %   MCV 78.1  78.0 - 100.0 fL   MCH 25.2 (L) 26.0 - 34.0 pg   MCHC 32.3 30.0 - 36.0 g/dL   RDW 14.5 11.5 - 15.5 %   Platelets 300 150 - 400 K/uL    No results found.  Assessment/Plan: 44 yo G2P2 SAAF here for hysteroscopy with polyp resection, possible fibroid resection, D&C due to DUB and endometrial mass noted on ultrasound 04/05/15.  All questions answered.  Pt ready to proceed.  Hale Bogus [facility] 04/25/2015, 10:17 AM

## 2015-04-25 NOTE — Transfer of Care (Signed)
Immediate Anesthesia Transfer of Care Note  Patient: Candace Graham  Procedure(s) Performed: Procedure(s): DILATATION & CURETTAGE/HYSTEROSCOPY WITH RESECTOCOPE (N/A)  Patient Location: PACU  Anesthesia Type:General  Level of Consciousness: awake, alert  and oriented  Airway & Oxygen Therapy: Patient Spontanous Breathing and Patient connected to nasal cannula oxygen  Post-op Assessment: Report given to RN and Post -op Vital signs reviewed and stable  Post vital signs: Reviewed and stable  Last Vitals:  Filed Vitals:   04/25/15 1002  BP:   Pulse:   Temp: 36.7 C  Resp:     Complications: No apparent anesthesia complications

## 2015-04-25 NOTE — Anesthesia Preprocedure Evaluation (Signed)

## 2015-04-26 ENCOUNTER — Encounter (HOSPITAL_COMMUNITY): Payer: Self-pay | Admitting: Obstetrics & Gynecology

## 2015-04-28 ENCOUNTER — Encounter: Payer: Self-pay | Admitting: Family

## 2015-04-28 ENCOUNTER — Ambulatory Visit (INDEPENDENT_AMBULATORY_CARE_PROVIDER_SITE_OTHER): Payer: BLUE CROSS/BLUE SHIELD | Admitting: Family

## 2015-04-28 ENCOUNTER — Other Ambulatory Visit (INDEPENDENT_AMBULATORY_CARE_PROVIDER_SITE_OTHER): Payer: BLUE CROSS/BLUE SHIELD

## 2015-04-28 ENCOUNTER — Telehealth: Payer: Self-pay | Admitting: Obstetrics & Gynecology

## 2015-04-28 VITALS — BP 114/80 | HR 81 | Temp 98.2°F | Resp 18 | Ht 67.25 in | Wt 228.0 lb

## 2015-04-28 DIAGNOSIS — N9489 Other specified conditions associated with female genital organs and menstrual cycle: Secondary | ICD-10-CM

## 2015-04-28 DIAGNOSIS — R7989 Other specified abnormal findings of blood chemistry: Secondary | ICD-10-CM

## 2015-04-28 DIAGNOSIS — M545 Low back pain: Secondary | ICD-10-CM

## 2015-04-28 DIAGNOSIS — R946 Abnormal results of thyroid function studies: Secondary | ICD-10-CM | POA: Diagnosis not present

## 2015-04-28 DIAGNOSIS — M549 Dorsalgia, unspecified: Secondary | ICD-10-CM | POA: Insufficient documentation

## 2015-04-28 DIAGNOSIS — E669 Obesity, unspecified: Secondary | ICD-10-CM

## 2015-04-28 DIAGNOSIS — R7309 Other abnormal glucose: Secondary | ICD-10-CM

## 2015-04-28 DIAGNOSIS — R7303 Prediabetes: Secondary | ICD-10-CM

## 2015-04-28 LAB — CBC
HCT: 35.7 % — ABNORMAL LOW (ref 36.0–46.0)
Hemoglobin: 11.6 g/dL — ABNORMAL LOW (ref 12.0–15.0)
MCHC: 32.5 g/dL (ref 30.0–36.0)
MCV: 77.7 fl — ABNORMAL LOW (ref 78.0–100.0)
Platelets: 337 10*3/uL (ref 150.0–400.0)
RBC: 4.59 Mil/uL (ref 3.87–5.11)
RDW: 15 % (ref 11.5–15.5)
WBC: 5.7 10*3/uL (ref 4.0–10.5)

## 2015-04-28 LAB — VITAMIN B12: Vitamin B-12: 287 pg/mL (ref 211–911)

## 2015-04-28 LAB — TSH: TSH: 2.13 u[IU]/mL (ref 0.35–4.50)

## 2015-04-28 NOTE — Telephone Encounter (Signed)
Agree with instructions that were given to pt.  Encounter closed.

## 2015-04-28 NOTE — Progress Notes (Signed)
Subjective:    Patient ID: Candace Graham, female    DOB: [birth year], 44 y.o.   MRN: [id number]  Chief Complaint  Patient presents with  . Establish Care    having lower back pain, also had a polyp removed from her uterus on monday and the doctor told her she should not be flowing heavy but she is states "super heavy"    HPI:  Candace Graham is a 44 y.o. female with a PMH of anemia, abnormal uterine bleeding, and uterine polyp removal who presents today for an office visit to establish care.   1.) Bleeding following surgery - Had a uterine polyp removed on Monday and had no complications. She noted on Wednesday to have some spotting and then increase in bleeding. Describes it as "heavy bleeding" going through a pad about every other hour since yesterday. Denies any current dizziness or weakness.  2.) Back pain - Associated symptom of pain located in her lower back more to the left side to center has been going on for months. On the day of surgery she noted that she had a little numbness and tingling that lasted in her right  toe. Describes the pain as there with a severity to effect her movement. She feels the pain both in standing and sitting. Denies radiculopathies. Sleeping certain ways and movements make it worse. She has not taken any medications or done any treatments. Denies changes bowel or bladder habits. Does describe occasional nocturia.   3.) Weight gain - patient notes associated symptom of increased weight of approximately 30 pounds over the last several months. She is interested in trying to lose weight. Has not tried any nutritional or physical activity. Would like information on medication if able to help.  4.) Lab work - noted to have elevated TSH and hemoglobin A1c.  No Known Allergies   Outpatient Prescriptions Prior to Visit  Medication Sig Dispense Refill  . Vitamin D, Ergocalciferol, (DRISDOL) 50000 UNITS CAPS capsule Take 1 capsule (50,000 Units total) by  mouth every 7 (seven) days. 12 capsule 0  . HYDROcodone-acetaminophen (NORCO/VICODIN) 5-325 MG per tablet Take 1-2 tablets by mouth every 6 (six) hours as needed for moderate pain or severe pain. 12 tablet 0  . ibuprofen (ADVIL,MOTRIN) 800 MG tablet Take 1 tablet (800 mg total) by mouth every 8 (eight) hours as needed. 30 tablet 0  . OVER THE COUNTER MEDICATION Take 1 capsule by mouth daily as needed (For constipation.). Patient takes natural laxative supplement Prunex.     No facility-administered medications prior to visit.     Past Medical History  Diagnosis Date  . Abnormal uterine bleeding     Heavy menstrual cycles  . Anemia   . Vaginal delivery 1991, 1999     Past Surgical History  Procedure Laterality Date  . No past surgeries    . Dilatation & currettage/hysteroscopy with resectocope N/A 04/25/2015    Procedure: DILATATION & CURETTAGE/HYSTEROSCOPY WITH RESECTOCOPE;  Surgeon: Megan Salon, MD;  Location: [facility];  Service: Gynecology;  Laterality: N/A;  . Uterine polyp removal       Family History  Problem Relation Age of Onset  . Pancreatic cancer Mother   . Hypertension Mother   . Stroke Mother   . Thyroid disease Mother   . Hypertension Brother   . Hypertension Brother   . Heart disease Father   . Arthritis Father   . Alcohol abuse Maternal Grandmother   . Esophageal cancer Maternal Grandmother   .  Drug abuse Maternal Grandfather      History   Social History  . Marital Status: Single    Spouse Name: N/A  . Number of Children: 2  . Years of Education: 16   Occupational History  . Not on file.   Social History Main Topics  . Smoking status: Never Smoker   . Smokeless tobacco: Never Used  . Alcohol Use: No  . Drug Use: No  . Sexual Activity:    Partners: Male    Birth Control/ Protection: None   Other Topics Concern  . Not on file   Social History Narrative   Fun: Travel, working with sons.   Denies religious beliefs effecting health care.      Denies abuse and feels safe at home.     Review of Systems  Constitutional: Negative for fever, chills and diaphoresis.  Endocrine: Negative for cold intolerance, heat intolerance, polydipsia, polyphagia and polyuria.  Genitourinary: Positive for vaginal bleeding.  Musculoskeletal: Positive for back pain.  Neurological: Positive for numbness. Negative for dizziness, weakness and light-headedness.      Objective:    BP 114/80 mmHg  Pulse 81  Temp(Src) 98.2 F (36.8 C) (Oral)  Resp 18  Ht 5' 7.25" (1.708 m)  Wt 228 lb (103.42 kg)  BMI 35.45 kg/m2  SpO2 97%  LMP 08/15/2014 Nursing note and vital signs reviewed.  Physical Exam  Constitutional: She is oriented to person, place, and time. She appears well-developed and well-nourished. No distress.  Cardiovascular: Normal rate, regular rhythm, normal heart sounds and intact distal pulses.   Pulmonary/Chest: Effort normal and breath sounds normal.  Musculoskeletal:  Lumbar spine: No obvious deformity, discoloration, or edema noted. No palpable tenderness able to be elicited. Range of motion is normal in all directions with no pain. Straight leg raise is negative. Distal pulses, sensation, and reflexes are intact and appropriate.  Neurological: She is alert and oriented to person, place, and time.  Skin: Skin is warm and dry.  Psychiatric: She has a normal mood and affect. Her behavior is normal. Judgment and thought content normal.       Assessment & Plan:   Problem List Items Addressed This Visit      Endocrine   Prediabetes    Elevated A1c of 6.0 noted on blood work prior to surgery. Discussed importance of improving diet and decreasing saturated fat while also increasing physical activity to 30 minutes most days a week. Follow-up pending lifestyle changes.        Genitourinary   Endometrial mass    Endometrial polyp removed on Monday by gynecology and has had increased bleeding since that time. Obtain hemoglobin to  check for blood loss. Patient instructed to contact GYN office at earliest convenience to discuss. If symptoms worsen or dizziness develops recommend emergent care.      Relevant Orders   CBC (Completed)     Other   Back pain - Primary    Not currently experiencing back pain and exam is benign. Home therapy program provided for lumbar strain. Continue conservative treatment with ice/heat, stretching, and over-the-counter pain control as needed. If symptoms worsen or fail to improve with home therapy, consider further imaging or formal therapy.      Relevant Orders   B12 (Completed)   Obesity    We can most likely related to birth control usage previously combined with sedentary lifestyle and diet. Discussed options of weight loss including lifestyle changes through increasing nutrient density and decreasing saturated fats  while increasing physical activity to 30 minutes most days of the week. Discussed risks and benefits of weight loss medications and how they may be used to assist. Information provided and after that summary. Follow-up after research is completed.      Elevated TSH    Elevated TSH noted in previous blood work. Obtain TSH to recheck for confirmation. Follow-up pending result.      Relevant Orders   TSH (Completed)

## 2015-04-28 NOTE — Assessment & Plan Note (Signed)
Elevated TSH noted in previous blood work. Obtain TSH to recheck for confirmation. Follow-up pending result.

## 2015-04-28 NOTE — Patient Instructions (Signed)
Thank you for choosing [facility].  Summary/Instructions:  Please stop by the lab on the basement level of the building for your blood work. Your results will be released to [place] (or called to you) after review, usually within 72 hours after test completion. If any changes need to be made, you will be notified at that same time.  If your symptoms worsen or fail to improve, please contact our office for further instruction, or in case of emergency go directly to the emergency room at the closest medical facility.    Low Back Sprain with Rehab  A sprain is an injury in which a ligament is torn. The ligaments of the lower back are vulnerable to sprains. However, they are strong and require great force to be injured. These ligaments are important for stabilizing the spinal column. Sprains are classified into three categories. Grade 1 sprains cause pain, but the tendon is not lengthened. Grade 2 sprains include a lengthened ligament, due to the ligament being stretched or partially ruptured. With grade 2 sprains there is still function, although the function may be decreased. Grade 3 sprains involve a complete tear of the tendon or muscle, and function is usually impaired. SYMPTOMS   Severe pain in the lower back.  Sometimes, a feeling of a "pop," "snap," or tear, at the time of injury.  Tenderness and sometimes swelling at the injury site.  Uncommonly, bruising (contusion) within 48 hours of injury.  Muscle spasms in the back. CAUSES  Low back sprains occur when a force is placed on the ligaments that is greater than they can handle. Common causes of injury include:  Performing a stressful act while off-balance.  Repetitive stressful activities that involve movement of the lower back.  Direct hit (trauma) to the lower back. RISK INCREASES WITH:  Contact sports (football, wrestling).  Collisions (major skiing accidents).  Sports that require throwing or lifting (baseball,  weightlifting).  Sports involving twisting of the spine (gymnastics, diving, tennis, golf).  Poor strength and flexibility.  Inadequate protection.  Previous back injury or surgery (especially fusion). PREVENTION  Wear properly fitted and padded protective equipment.  Warm up and stretch properly before activity.  Allow for adequate recovery between workouts.  Maintain physical fitness:  Strength, flexibility, and endurance.  Cardiovascular fitness.  Maintain a healthy body weight. PROGNOSIS  If treated properly, low back sprains usually heal with non-surgical treatment. The length of time for healing depends on the severity of the injury.  RELATED COMPLICATIONS   Recurring symptoms, resulting in a chronic problem.  Chronic inflammation and pain in the low back.  Delayed healing or resolution of symptoms, especially if activity is resumed too soon.  Prolonged impairment.  Unstable or arthritic joints of the low back. TREATMENT  Treatment first involves the use of ice and medicine, to reduce pain and inflammation. The use of strengthening and stretching exercises may help reduce pain with activity. These exercises may be performed at home or with a therapist. Severe injuries may require referral to a therapist for further evaluation and treatment, such as ultrasound. Your caregiver may advise that you wear a back brace or corset, to help reduce pain and discomfort. Often, prolonged bed rest results in greater harm then benefit. Corticosteroid injections may be recommended. However, these should be reserved for the most serious cases. It is important to avoid using your back when lifting objects. At night, sleep on your back on a firm mattress, with a pillow placed under your knees. If non-surgical  treatment is unsuccessful, surgery may be needed.  MEDICATION   If pain medicine is needed, nonsteroidal anti-inflammatory medicines (aspirin and ibuprofen), or other minor pain  relievers (acetaminophen), are often advised.  Do not take pain medicine for 7 days before surgery.  Prescription pain relievers may be given, if your caregiver thinks they are needed. Use only as directed and only as much as you need.  Ointments applied to the skin may be helpful.  Corticosteroid injections may be given by your caregiver. These injections should be reserved for the most serious cases, because they may only be given a certain number of times. HEAT AND COLD  Cold treatment (icing) should be applied for 10 to 15 minutes every 2 to 3 hours for inflammation and pain, and immediately after activity that aggravates your symptoms. Use ice packs or an ice massage.  Heat treatment may be used before performing stretching and strengthening activities prescribed by your caregiver, physical therapist, or athletic trainer. Use a heat pack or a warm water soak. SEEK MEDICAL CARE IF:   Symptoms get worse or do not improve in 2 to 4 weeks, despite treatment.  You develop numbness or weakness in either leg.  You lose bowel or bladder function.  Any of the following occur after surgery: fever, increased pain, swelling, redness, drainage of fluids, or bleeding in the affected area.  New, unexplained symptoms develop. (Drugs used in treatment may produce side effects.) EXERCISES  RANGE OF MOTION (ROM) AND STRETCHING EXERCISES - Low Back Sprain Most people with lower back pain will find that their symptoms get worse with excessive bending forward (flexion) or arching at the lower back (extension). The exercises that will help resolve your symptoms will focus on the opposite motion.  Your physician, physical therapist or athletic trainer will help you determine which exercises will be most helpful to resolve your lower back pain. Do not complete any exercises without first consulting with your caregiver. Discontinue any exercises which make your symptoms worse, until you speak to your  caregiver. If you have pain, numbness or tingling which travels down into your buttocks, leg or foot, the goal of the therapy is for these symptoms to move closer to your back and eventually resolve. Sometimes, these leg symptoms will get better, but your lower back pain may worsen. This is often an indication of progress in your rehabilitation. Be very alert to any changes in your symptoms and the activities in which you participated in the 24 hours prior to the change. Sharing this information with your caregiver will allow him or her to most efficiently treat your condition. These exercises may help you when beginning to rehabilitate your injury. Your symptoms may resolve with or without further involvement from your physician, physical therapist or athletic trainer. While completing these exercises, remember:   Restoring tissue flexibility helps normal motion to return to the joints. This allows healthier, less painful movement and activity.  An effective stretch should be held for at least 30 seconds.  A stretch should never be painful. You should only feel a gentle lengthening or release in the stretched tissue. FLEXION RANGE OF MOTION AND STRETCHING EXERCISES: STRETCH - Flexion, Single Knee to Chest   Lie on a firm bed or floor with both legs extended in front of you.  Keeping one leg in contact with the floor, bring your opposite knee to your chest. Hold your leg in place by either grabbing behind your thigh or at your knee.  Pull until you  feel a gentle stretch in your low back. Hold __________ seconds.  Slowly release your grasp and repeat the exercise with the opposite side. Repeat __________ times. Complete this exercise __________ times per day.  STRETCH - Flexion, Double Knee to Chest  Lie on a firm bed or floor with both legs extended in front of you.  Keeping one leg in contact with the floor, bring your opposite knee to your chest.  Tense your stomach muscles to support  your back and then lift your other knee to your chest. Hold your legs in place by either grabbing behind your thighs or at your knees.  Pull both knees toward your chest until you feel a gentle stretch in your low back. Hold __________ seconds.  Tense your stomach muscles and slowly return one leg at a time to the floor. Repeat __________ times. Complete this exercise __________ times per day.  STRETCH - Low Trunk Rotation  Lie on a firm bed or floor. Keeping your legs in front of you, bend your knees so they are both pointed toward the ceiling and your feet are flat on the floor.  Extend your arms out to the side. This will stabilize your upper body by keeping your shoulders in contact with the floor.  Gently and slowly drop both knees together to one side until you feel a gentle stretch in your low back. Hold for __________ seconds.  Tense your stomach muscles to support your lower back as you bring your knees back to the starting position. Repeat the exercise to the other side. Repeat __________ times. Complete this exercise __________ times per day  EXTENSION RANGE OF MOTION AND FLEXIBILITY EXERCISES: STRETCH - Extension, Prone on Elbows   Lie on your stomach on the floor, a bed will be too soft. Place your palms about shoulder width apart and at the height of your head.  Place your elbows under your shoulders. If this is too painful, stack pillows under your chest.  Allow your body to relax so that your hips drop lower and make contact more completely with the floor.  Hold this position for __________ seconds.  Slowly return to lying flat on the floor. Repeat __________ times. Complete this exercise __________ times per day.  RANGE OF MOTION - Extension, Prone Press Ups  Lie on your stomach on the floor, a bed will be too soft. Place your palms about shoulder width apart and at the height of your head.  Keeping your back as relaxed as possible, slowly straighten your elbows  while keeping your hips on the floor. You may adjust the placement of your hands to maximize your comfort. As you gain motion, your hands will come more underneath your shoulders.  Hold this position __________ seconds.  Slowly return to lying flat on the floor. Repeat __________ times. Complete this exercise __________ times per day.  RANGE OF MOTION- Quadruped, Neutral Spine   Assume a hands and knees position on a firm surface. Keep your hands under your shoulders and your knees under your hips. You may place padding under your knees for comfort.  Drop your head and point your tailbone toward the ground below you. This will round out your lower back like an angry cat. Hold this position for __________ seconds.  Slowly lift your head and release your tail bone so that your back sags into a large arch, like an old horse.  Hold this position for __________ seconds.  Repeat this until you feel limber in your  low back.  Now, find your "sweet spot." This will be the most comfortable position somewhere between the two previous positions. This is your neutral spine. Once you have found this position, tense your stomach muscles to support your low back.  Hold this position for __________ seconds. Repeat __________ times. Complete this exercise __________ times per day.  STRENGTHENING EXERCISES - Low Back Sprain These exercises may help you when beginning to rehabilitate your injury. These exercises should be done near your "sweet spot." This is the neutral, low-back arch, somewhere between fully rounded and fully arched, that is your least painful position. When performed in this safe range of motion, these exercises can be used for people who have either a flexion or extension based injury. These exercises may resolve your symptoms with or without further involvement from your physician, physical therapist or athletic trainer. While completing these exercises, remember:   Muscles can gain both  the endurance and the strength needed for everyday activities through controlled exercises.  Complete these exercises as instructed by your physician, physical therapist or athletic trainer. Increase the resistance and repetitions only as guided.  You may experience muscle soreness or fatigue, but the pain or discomfort you are trying to eliminate should never worsen during these exercises. If this pain does worsen, stop and make certain you are following the directions exactly. If the pain is still present after adjustments, discontinue the exercise until you can discuss the trouble with your caregiver. STRENGTHENING - Deep Abdominals, Pelvic Tilt   Lie on a firm bed or floor. Keeping your legs in front of you, bend your knees so they are both pointed toward the ceiling and your feet are flat on the floor.  Tense your lower abdominal muscles to press your low back into the floor. This motion will rotate your pelvis so that your tail bone is scooping upwards rather than pointing at your feet or into the floor. With a gentle tension and even breathing, hold this position for __________ seconds. Repeat __________ times. Complete this exercise __________ times per day.  STRENGTHENING - Abdominals, Crunches   Lie on a firm bed or floor. Keeping your legs in front of you, bend your knees so they are both pointed toward the ceiling and your feet are flat on the floor. Cross your arms over your chest.  Slightly tip your chin down without bending your neck.  Tense your abdominals and slowly lift your trunk high enough to just clear your shoulder blades. Lifting higher can put excessive stress on the lower back and does not further strengthen your abdominal muscles.  Control your return to the starting position. Repeat __________ times. Complete this exercise __________ times per day.  STRENGTHENING - Quadruped, Opposite UE/LE Lift   Assume a hands and knees position on a firm surface. Keep your hands  under your shoulders and your knees under your hips. You may place padding under your knees for comfort.  Find your neutral spine and gently tense your abdominal muscles so that you can maintain this position. Your shoulders and hips should form a rectangle that is parallel with the floor and is not twisted.  Keeping your trunk steady, lift your right hand no higher than your shoulder and then your left leg no higher than your hip. Make sure you are not holding your breath. Hold this position for __________ seconds.  Continuing to keep your abdominal muscles tense and your back steady, slowly return to your starting position. Repeat with the opposite  arm and leg. Repeat __________ times. Complete this exercise __________ times per day.  STRENGTHENING - Abdominals and Quadriceps, Straight Leg Raise   Lie on a firm bed or floor with both legs extended in front of you.  Keeping one leg in contact with the floor, bend the other knee so that your foot can rest flat on the floor.  Find your neutral spine, and tense your abdominal muscles to maintain your spinal position throughout the exercise.  Slowly lift your straight leg off the floor about 6 inches for a count of 15, making sure to not hold your breath.  Still keeping your neutral spine, slowly lower your leg all the way to the floor. Repeat this exercise with each leg __________ times. Complete this exercise __________ times per day. POSTURE AND BODY MECHANICS CONSIDERATIONS - Low Back Sprain Keeping correct posture when sitting, standing or completing your activities will reduce the stress put on different body tissues, allowing injured tissues a chance to heal and limiting painful experiences. The following are general guidelines for improved posture. Your physician or physical therapist will provide you with any instructions specific to your needs. While reading these guidelines, remember:  The exercises prescribed by your provider will  help you have the flexibility and strength to maintain correct postures.  The correct posture provides the best environment for your joints to work. All of your joints have less wear and tear when properly supported by a spine with good posture. This means you will experience a healthier, less painful body.  Correct posture must be practiced with all of your activities, especially prolonged sitting and standing. Correct posture is as important when doing repetitive low-stress activities (typing) as it is when doing a single heavy-load activity (lifting). RESTING POSITIONS Consider which positions are most painful for you when choosing a resting position. If you have pain with flexion-based activities (sitting, bending, stooping, squatting), choose a position that allows you to rest in a less flexed posture. You would want to avoid curling into a fetal position on your side. If your pain worsens with extension-based activities (prolonged standing, working overhead), avoid resting in an extended position such as sleeping on your stomach. Most people will find more comfort when they rest with their spine in a more neutral position, neither too rounded nor too arched. Lying on a non-sagging bed on your side with a pillow between your knees, or on your back with a pillow under your knees will often provide some relief. Keep in mind, being in any one position for a prolonged period of time, no matter how correct your posture, can still lead to stiffness. PROPER SITTING POSTURE In order to minimize stress and discomfort on your spine, you must sit with correct posture. Sitting with good posture should be effortless for a healthy body. Returning to good posture is a gradual process. Many people can work toward this most comfortably by using various supports until they have the flexibility and strength to maintain this posture on their own. When sitting with proper posture, your ears will fall over your shoulders  and your shoulders will fall over your hips. You should use the back of the chair to support your upper back. Your lower back will be in a neutral position, just slightly arched. You may place a small pillow or folded towel at the base of your lower back for  support.  When working at a desk, create an environment that supports good, upright posture. Without extra support, muscles tire,  which leads to excessive strain on joints and other tissues. Keep these recommendations in mind: CHAIR:  A chair should be able to slide under your desk when your back makes contact with the back of the chair. This allows you to work closely.  The chair's height should allow your eyes to be level with the upper part of your monitor and your hands to be slightly lower than your elbows. BODY POSITION  Your feet should make contact with the floor. If this is not possible, use a foot rest.  Keep your ears over your shoulders. This will reduce stress on your neck and low back. INCORRECT SITTING POSTURES  If you are feeling tired and unable to assume a healthy sitting posture, do not slouch or slump. This puts excessive strain on your back tissues, causing more damage and pain. Healthier options include:  Using more support, like a lumbar pillow.  Switching tasks to something that requires you to be upright or walking.  Talking a brief walk.  Lying down to rest in a neutral-spine position. PROLONGED STANDING WHILE SLIGHTLY LEANING FORWARD  When completing a task that requires you to lean forward while standing in one place for a long time, place either foot up on a stationary 2-4 inch high object to help maintain the best posture. When both feet are on the ground, the lower back tends to lose its slight inward curve. If this curve flattens (or becomes too large), then the back and your other joints will experience too much stress, tire more quickly, and can cause pain. CORRECT STANDING POSTURES Proper standing  posture should be assumed with all daily activities, even if they only take a few moments, like when brushing your teeth. As in sitting, your ears should fall over your shoulders and your shoulders should fall over your hips. You should keep a slight tension in your abdominal muscles to brace your spine. Your tailbone should point down to the ground, not behind your body, resulting in an over-extended swayback posture.  INCORRECT STANDING POSTURES  Common incorrect standing postures include a forward head, locked knees and/or an excessive swayback. WALKING Walk with an upright posture. Your ears, shoulders and hips should all line-up. PROLONGED ACTIVITY IN A FLEXED POSITION When completing a task that requires you to bend forward at your waist or lean over a low surface, try to find a way to stabilize 3 out of 4 of your limbs. You can place a hand or elbow on your thigh or rest a knee on the surface you are reaching across. This will provide you more stability, so that your muscles do not tire as quickly. By keeping your knees relaxed, or slightly bent, you will also reduce stress across your lower back. CORRECT LIFTING TECHNIQUES DO :  Assume a wide stance. This will provide you more stability and the opportunity to get as close as possible to the object which you are lifting.  Tense your abdominals to brace your spine. Bend at the knees and hips. Keeping your back locked in a neutral-spine position, lift using your leg muscles. Lift with your legs, keeping your back straight.  Test the weight of unknown objects before attempting to lift them.  Try to keep your elbows locked down at your sides in order get the best strength from your shoulders when carrying an object.  Always ask for help when lifting heavy or awkward objects. INCORRECT LIFTING TECHNIQUES DO NOT:   Lock your knees when lifting, even if it  is a small object.  Bend and twist. Pivot at your feet or move your feet when needing  to change directions.  Assume that you can safely pick up even a paperclip without proper posture. Document Released: 10/01/2005 Document Revised: 12/24/2011 Document Reviewed: 01/13/2009 [facility] Patient Information 2015 [place], [place]. This information is not intended to replace advice given to you by your health care provider. Make sure you discuss any questions you have with your health care provider.  Phentermine tablets or capsules What is this medicine? PHENTERMINE (FEN ter meen) decreases your appetite. It is used with a reduced calorie diet and exercise to help you lose weight. This medicine may be used for other purposes; ask your health care provider or pharmacist if you have questions. COMMON BRAND NAME(S): Adipex-P, Atti-Plex P, Atti-Plex P Spansule, Fastin, Pro-Fast, Tara-8 What should I tell my health care provider before I take this medicine? They need to know if you have any of these conditions: -agitation -glaucoma -heart disease -high blood pressure -history of substance abuse -lung disease called Primary Pulmonary Hypertension (PPH) -taken an MAOI like Carbex, Eldepryl, Marplan, Nardil, or Parnate in last 14 days -thyroid disease -an unusual or allergic reaction to phentermine, other medicines, foods, dyes, or preservatives -pregnant or trying to get pregnant -breast-feeding How should I use this medicine? Take this medicine by mouth with a glass of water. Follow the directions on the prescription label. This medicine is usually taken 30 minutes before or 1 to 2 hours after breakfast. Avoid taking this medicine in the evening. It may interfere with sleep. Take your doses at regular intervals. Do not take your medicine more often than directed. Talk to your pediatrician regarding the use of this medicine in children. Special care may be needed. Overdosage: If you think you have taken too much of this medicine contact a poison control center or emergency room at  once. NOTE: This medicine is only for you. Do not share this medicine with others. What if I miss a dose? If you miss a dose, take it as soon as you can. If it is almost time for your next dose, take only that dose. Do not take double or extra doses. What may interact with this medicine? Do not take this medicine with any of the following medications: -duloxetine -MAOIs like Carbex, Eldepryl, Marplan, Nardil, and Parnate -medicines for colds or breathing difficulties like pseudoephedrine or phenylephrine -procarbazine -sibutramine -SSRIs like citalopram, escitalopram, fluoxetine, fluvoxamine, paroxetine, and sertraline -stimulants like dexmethylphenidate, methylphenidate or modafinil -venlafaxine This medicine may also interact with the following medications: -medicines for diabetes This list may not describe all possible interactions. Give your health care provider a list of all the medicines, herbs, non-prescription drugs, or dietary supplements you use. Also tell them if you smoke, drink alcohol, or use illegal drugs. Some items may interact with your medicine. What should I watch for while using this medicine? Notify your physician immediately if you become short of breath while doing your normal activities. Do not take this medicine within 6 hours of bedtime. It can keep you from getting to sleep. Avoid drinks that contain caffeine and try to stick to a regular bedtime every night. This medicine was intended to be used in addition to a healthy diet and exercise. The best results are achieved this way. This medicine is only indicated for short-term use. Eventually your weight loss may level out. At that point, the drug will only help you maintain your new weight. Do not increase or  in any way change your dose without consulting your doctor. You may get drowsy or dizzy. Do not drive, use machinery, or do anything that needs mental alertness until you know how this medicine affects you. Do not  stand or sit up quickly, especially if you are an older patient. This reduces the risk of dizzy or fainting spells. Alcohol may increase dizziness and drowsiness. Avoid alcoholic drinks. What side effects may I notice from receiving this medicine? Side effects that you should report to your doctor or health care professional as soon as possible: -chest pain, palpitations -depression or severe changes in mood -increased blood pressure -irritability -nervousness or restlessness -severe dizziness -shortness of breath -problems urinating -unusual swelling of the legs -vomiting Side effects that usually do not require medical attention (report to your doctor or health care professional if they continue or are bothersome): -blurred vision or other eye problems -changes in sexual ability or desire -constipation or diarrhea -difficulty sleeping -dry mouth or unpleasant taste -headache -nausea This list may not describe all possible side effects. Call your doctor for medical advice about side effects. You may report side effects to FDA at 1-800-FDA-1088. Where should I keep my medicine? Keep out of the reach of children. This medicine can be abused. Keep your medicine in a safe place to protect it from theft. Do not share this medicine with anyone. Selling or giving away this medicine is dangerous and against the law. Store at room temperature between 20 and 25 degrees C (68 and 77 degrees F). Keep container tightly closed. Throw away any unused medicine after the expiration date. NOTE: This sheet is a summary. It may not cover all possible information. If you have questions about this medicine, talk to your doctor, pharmacist, or health care provider.  2015, [employer] Standard. (2010-11-15 11:02:44)   Lorcaserin oral tablets What is this medicine? LORCASERIN (lor ca SER in) is used to promote and maintain weight loss in obese patients. This medicine should be used with a reduced calorie  diet and, if appropriate, an exercise program. This medicine may be used for other purposes; ask your health care provider or pharmacist if you have questions. COMMON BRAND NAME(S): Belviq What should I tell my health care provider before I take this medicine? They need to know if you have any of these conditions: -anatomical deformation of the penis, Peyronie's disease, or history of priapism (painful and prolonged erection) -diabetes -heart disease -history of blood diseases, like sickle cell anemia or leukemia -history of irregular heartbeat -kidney disease -liver disease -suicidal thoughts, plans, or attempt; a previous suicide attempt by you or a family member -an unusual or allergic reaction to lorcaserin, other medicines, foods, dyes, or preservatives -pregnant or trying to get pregnant -breast-feeding How should I use this medicine? Take this medicine by mouth with a glass of water. Follow the directions on the prescription label. You can take it with or without food. Take your medicine at regular intervals. Do not take it more often than directed. Do not stop taking except on your doctor's advice. Talk to your pediatrician regarding the use of this medicine in children. Special care may be needed. Overdosage: If you think you've taken too much of this medicine contact a poison control center or emergency room at once. Overdosage: If you think you have taken too much of this medicine contact a poison control center or emergency room at once. NOTE: This medicine is only for you. Do not share this medicine with others. What  if I miss a dose? If you miss a dose, take it as soon as you can. If it is almost time for your next dose, take only that dose. Do not take double or extra doses. What may interact with this medicine? -cabergoline -certain medicines for depression, anxiety, or psychotic disturbances -certain medicines for erectile dysfunction -certain medicines for migraine  headache like almotriptan, eletriptan, frovatriptan, naratriptan, rizatriptan, sumatriptan, zolmitriptan -dextromethorphan -linezolid -MAOIs like Carbex, Eldepryl, Marplan, Nardil, and Parnate -medicines for diabetes -orlistat -tramadol -St. John's Wort -stimulant medicines for attention disorders, weight loss, or to stay awake This list may not describe all possible interactions. Give your health care provider a list of all the medicines, herbs, non-prescription drugs, or dietary supplements you use. Also tell them if you smoke, drink alcohol, or use illegal drugs. Some items may interact with your medicine. What should I watch for while using this medicine? This medicine is intended to be used in addition to a healthy diet and appropriate exercise. The best results are achieved this way. Do not increase or in any way change your dose without consulting your doctor or health care professional. Your doctor should tell you to stop taking this medicine if you do not lose a certain amount of weight within the first 12 weeks of treatment. Visit your doctor or health care professional for regular checkups. Your doctor may order blood tests or other tests to see how you are doing. Do not drive, use machinery, or do anything that needs mental alertness until you know how this medicine affects you. This medicine may affect blood sugar levels. If you have diabetes, check with your doctor or health care professional before you change your diet or the dose of your diabetic medicine. Patients and their families should watch out for worsening depression or thoughts of suicide. Also watch out for sudden changes in feelings such as feeling anxious, agitated, panicky, irritable, hostile, aggressive, impulsive, severely restless, overly excited and hyperactive, or not being able to sleep. If this happens, especially at the beginning of treatment or after a change in dose, call your health care professional. Contact  you doctor or health care professional right away if the erection lasts longer than 4 hours or if it becomes painful. This may be a sign of serious problem and must be treated right away to prevent permanent damage. What side effects may I notice from receiving this medicine? Side effects that you should report to your doctor or health care professional as soon as possible: -allergic reactions like skin rash, itching or hives, swelling of the face, lips, or tongue -abnormal production of milk -breast enlargement in both males and females -breathing problems -changes in emotions or moods -changes in vision -confusion -erection lasting more than 4 hours -fast or irregular heart beat -feeling faint or lightheaded, falls -fever or chills, sore throat -hallucination, loss of contact with reality -high or low blood pressure -menstrual changes -restlessness -seizures -slow or irregular heartbeat -stiff muscles -sweating -suicidal thoughts or other mood changes -tremors -trouble walking -unusually weak or tired -vomiting Side effects that usually do not require medical attention (Report these to your doctor or health care professional if they continue or are bothersome.): -back pain -constipation -cough -dizziness -dry mouth -low blood sugar if you have diabetes (ask your doctor or healthcare professional for a list of these symptoms) -nausea -tiredness This list may not describe all possible side effects. Call your doctor for medical advice about side effects. You may report side effects  to FDA at 1-800-FDA-1088. Where should I keep my medicine? Keep out of the reach of children. Store at room temperature between 15 and 30 degrees C (59 and 86 degrees F). Throw away any unused medicine after the expiration date. NOTE: This sheet is a summary. It may not cover all possible information. If you have questions about this medicine, talk to your doctor, pharmacist, or health care  provider.  2015, [employer] Standard. (2011-04-17 17:15:17)  Bupropion; Naltrexone extended-release tablets What is this medicine? BUPROPION; NALTREXONE (byoo PROE pee on; nal TREX one) is a combination product used to promote and maintain weight loss in obese adults or overweight adults who also have weight related medical problems. This medicine should be used with a reduced calorie diet and increased physical activity. This medicine may be used for other purposes; ask your health care provider or pharmacist if you have questions. COMMON BRAND NAME(S): CONTRAVE What should I tell my health care provider before I take this medicine? They need to know if you have any of these conditions: -an eating disorder, such as anorexia or bulimia -diabetes -glaucoma -head injury -heart disease -high blood pressure -history of a drug or alcohol abuse problem -history of a tumor or infection of your brain or spine -history of stroke -history of irregular heartbeat -kidney disease -liver disease -mental illness such as bipolar disorder or psychosis -seizures -suicidal thoughts, plans, or attempt; a previous suicide attempt by you or a family member -an unusual or allergic reaction to bupropion, naltrexone, other medicines, foods, dyes, or preservatives breast-feeding -pregnant or trying to become pregnant How should I use this medicine? Take this medicine by mouth with a glass of water. Follow the directions on the prescription label. Take this medicine in the morning and in the evenings as directed by your healthcare professional. Dennis Bast can take it with or without food. Do not take with high-fat meals as this may increase your risk of seizures. Do not crush, chew, or cut these tablets. Do not take your medicine more often than directed. Do not stop taking this medicine suddenly except upon the advice of your doctor. A special MedGuide will be given to you by the pharmacist with each prescription  and refill. Be sure to read this information carefully each time. Talk to your pediatrician regarding the use of this medicine in children. Special care may be needed. Overdosage: If you think you've taken too much of this medicine contact a poison control center or emergency room at once. Overdosage: If you think you have taken too much of this medicine contact a poison control center or emergency room at once. NOTE: This medicine is only for you. Do not share this medicine with others. What if I miss a dose? If you miss a dose, skip the missed dose and take your next tablet at the regular time. Do not take double or extra doses. What may interact with this medicine? Do not take this medicine with any of the following medications: -any prescription or street opioid drug like codiene, heroin, methadone -linezolid -MAOIs like Carbex, Eldepryl, Marplan, Nardil, and Parnate -methylene blue (injected into a vein) -other medicines that contain bupropion like Zyban or Wellbutrin This medicine may also interact with the following medications: -alcohol -certain medicines for anxiety or sleep -certain medicines for blood pressure like metoprolol, propranolol -certain medicines for depression or psychotic disturbances -certain medicines for HIV or AIDS like efavirenz, lopinavir, nelfinavir, ritonavir -certain medicines for irregular heart beat like propafenone, flecainide -certain medicines for  Parkinson's disease like amantadine, levodopa -certain medicines for seizures like carbamazepine, phenytoin, phenobarbital -cimetidine -clopidogrel -cyclophosphamide -disulfiram -furazolidone -isoniazid -nicotine -orphenadrine -procarbazine -steroid medicines like prednisone or cortisone -stimulant medicines for attention disorders, weight loss, or to stay awake -tamoxifen -theophylline -thioridazine -thiotepa -ticlopidine -tramadol -warfarin This list may not describe all possible interactions.  Give your health care provider a list of all the medicines, herbs, non-prescription drugs, or dietary supplements you use. Also tell them if you smoke, drink alcohol, or use illegal drugs. Some items may interact with your medicine. What should I watch for while using this medicine? This medicine is intended to be used in addition to a healthy diet and appropriate exercise. The best results are achieved this way. Do not increase or in any way change your dose without consulting your doctor or health care professional. Do not take this medicine with other prescription or over-the-counter weight loss products without consulting your doctor or health care professional. Your doctor should tell you to stop taking this medicine if you do not lose a certain amount of weight within the first 12 weeks of treatment. Visit your doctor or health care professional for regular checkups. Your doctor may order blood tests or other tests to see how you are doing. This medicine may affect blood sugar levels. If you have diabetes, check with your doctor or health care professional before you change your diet or the dose of your diabetic medicine. Patients and their families should watch out for new or worsening depression or thoughts of suicide. Also watch out for sudden changes in feelings such as feeling anxious, agitated, panicky, irritable, hostile, aggressive, impulsive, severely restless, overly excited and hyperactive, or not being able to sleep. If this happens, especially at the beginning of treatment or after a change in dose, call your health care professional. Avoid alcoholic drinks while taking this medicine. Drinking large amounts of alcoholic beverages, using sleeping or anxiety medicines, or quickly stopping the use of these agents while taking this medicine may increase your risk for a seizure. What side effects may I notice from receiving this medicine? Side effects that you should report to your doctor or  health care professional as soon as possible: -allergic reactions like skin rash, itching or hives, swelling of the face, lips, or tongue -breathing problems -changes in vision, hearing -chest pain -confusion -dark urine -depressed mood -fast or irregular heart beat -fever -hallucination, loss of contact with reality -increased blood pressure -light-colored stools -redness, blistering, peeling or loosening of the skin, including inside the mouth -right upper belly pain -seizures -suicidal thoughts or other mood changes -unusually weak or tired -vomiting -yellowing of the eyes or skin Side effects that usually do not require medical attention (Report these to your doctor or health care professional if they continue or are bothersome.): -constipation -diarrhea -dizziness -dry mouth -headache -nausea -trouble sleeping This list may not describe all possible side effects. Call your doctor for medical advice about side effects. You may report side effects to FDA at 1-800-FDA-1088. Where should I keep my medicine? Keep out of the reach of children. Store at room temperature between 15 and 30 degrees C (59 and 86 degrees F). Throw away any unused medicine after the expiration date. NOTE: This sheet is a summary. It may not cover all possible information. If you have questions about this medicine, talk to your doctor, pharmacist, or health care provider.  2015, [employer] Standard. (2013-07-08 15:17:29)  Metformin tablets What is this medicine? METFORMIN (met FOR min)  is used to treat type 2 diabetes. It helps to control blood sugar. Treatment is combined with diet and exercise. This medicine can be used alone or with other medicines for diabetes. This medicine may be used for other purposes; ask your health care provider or pharmacist if you have questions. COMMON BRAND NAME(S): Glucophage What should I tell my health care provider before I take this medicine? They need to know  if you have any of these conditions: -anemia -frequently drink alcohol-containing beverages -become easily dehydrated -heart attack -heart failure that is treated with medications -kidney disease -liver disease -polycystic ovary syndrome -serious infection or injury -vomiting -an unusual or allergic reaction to metformin, other medicines, foods, dyes, or preservatives -pregnant or trying to get pregnant -breast-feeding How should I use this medicine? Take this medicine by mouth. Take it with meals. Swallow the tablets with a drink of water. Follow the directions on the prescription label. Take your medicine at regular intervals. Do not take your medicine more often than directed. Talk to your pediatrician regarding the use of this medicine in children. While this drug may be prescribed for children as young as 62 years of age for selected conditions, precautions do apply. Overdosage: If you think you have taken too much of this medicine contact a poison control center or emergency room at once. NOTE: This medicine is only for you. Do not share this medicine with others. What if I miss a dose? If you miss a dose, take it as soon as you can. If it is almost time for your next dose, take only that dose. Do not take double or extra doses. What may interact with this medicine? Do not take this medicine with any of the following medications: -dofetilide -gatifloxacin -certain contrast medicines given before X-rays, CT scans, MRI, or other procedures This medicine may also interact with the following medications: -digoxin -diuretics -female hormones, like estrogens or progestins and birth control pills -isoniazid -medicines for blood pressure, heart disease, irregular heart beat -morphine -nicotinic acid -phenothiazines like chlorpromazine, mesoridazine, prochlorperazine, thioridazine -phenytoin -procainamide -quinidine -quinine -ranitidine -steroid medicines like prednisone or  cortisone -stimulant medicines for attention disorders, weight loss, or to stay awake -thyroid medicines -trimethoprim -vancomycin This list may not describe all possible interactions. Give your health care provider a list of all the medicines, herbs, non-prescription drugs, or dietary supplements you use. Also tell them if you smoke, drink alcohol, or use illegal drugs. Some items may interact with your medicine. What should I watch for while using this medicine? Visit your doctor or health care professional for regular checks on your progress. A test called the HbA1C (A1C) will be monitored. This is a simple blood test. It measures your blood sugar control over the last 2 to 3 months. You will receive this test every 3 to 6 months. Learn how to check your blood sugar. Learn the symptoms of low and high blood sugar and how to manage them. Always carry a quick-source of sugar with you in case you have symptoms of low blood sugar. Examples include hard sugar candy or glucose tablets. Make sure others know that you can choke if you eat or drink when you develop serious symptoms of low blood sugar, such as seizures or unconsciousness. They must get medical help at once. Tell your doctor or health care professional if you have high blood sugar. You might need to change the dose of your medicine. If you are sick or exercising more than usual, you might need  to change the dose of your medicine. Do not skip meals. Ask your doctor or health care professional if you should avoid alcohol. Many nonprescription cough and cold products contain sugar or alcohol. These can affect blood sugar. This medicine may cause ovulation in premenopausal women who do not have regular monthly periods. This may increase your chances of becoming pregnant. You should not take this medicine if you become pregnant or think you may be pregnant. Talk with your doctor or health care professional about your birth control options while  taking this medicine. Contact your doctor or health care professional right away if think you are pregnant. If you are going to need surgery, a MRI, CT scan, or other procedure, tell your doctor that you are taking this medicine. You may need to stop taking this medicine before the procedure. Wear a medical ID bracelet or chain, and carry a card that describes your disease and details of your medicine and dosage times. What side effects may I notice from receiving this medicine? Side effects that you should report to your doctor or health care professional as soon as possible: -allergic reactions like skin rash, itching or hives, swelling of the face, lips, or tongue -breathing problems -feeling faint or lightheaded, falls -muscle aches or pains -signs and symptoms of low blood sugar such as feeling anxious, confusion, dizziness, increased hunger, unusually weak or tired, sweating, shakiness, cold, irritable, headache, blurred vision, fast heartbeat, loss of consciousness -slow or irregular heartbeat -unusual stomach pain or discomfort -unusually tired or weak Side effects that usually do not require medical attention (report to your doctor or health care professional if they continue or are bothersome): -diarrhea -headache -heartburn -metallic taste in mouth -nausea -stomach gas, upset This list may not describe all possible side effects. Call your doctor for medical advice about side effects. You may report side effects to FDA at 1-800-FDA-1088. Where should I keep my medicine? Keep out of the reach of children. Store at room temperature between 15 and 30 degrees C (59 and 86 degrees F). Protect from moisture and light. Throw away any unused medicine after the expiration date. NOTE: This sheet is a summary. It may not cover all possible information. If you have questions about this medicine, talk to your doctor, pharmacist, or health care provider.  2015, [employer] Standard.  (2013-01-13 16:03:44)

## 2015-04-28 NOTE — Assessment & Plan Note (Signed)
Not currently experiencing back pain and exam is benign. Home therapy program provided for lumbar strain. Continue conservative treatment with ice/heat, stretching, and over-the-counter pain control as needed. If symptoms worsen or fail to improve with home therapy, consider further imaging or formal therapy.

## 2015-04-28 NOTE — Telephone Encounter (Signed)
Spoke with patient. Advised patient that I have spoken with Dr.Miller who states bleeding she is experiencing is not abnormal. Advised this is to be expected after procedure with how much tissue was removed and pathology results. Advised to monitor the bleeding. If she feels bleeding is increasing, feels dizzy, fatigued, or weak will need to give our office a call or be seen at local ER. Patient is agreeable and verbalizes understanding.  Routing to [place] for review.

## 2015-04-28 NOTE — Telephone Encounter (Signed)
Patient had surgery on 04/25/15 and states she is having a very heavy flow and wants to know if this is normal.

## 2015-04-28 NOTE — Assessment & Plan Note (Signed)
We can most likely related to birth control usage previously combined with sedentary lifestyle and diet. Discussed options of weight loss including lifestyle changes through increasing nutrient density and decreasing saturated fats while increasing physical activity to 30 minutes most days of the week. Discussed risks and benefits of weight loss medications and how they may be used to assist. Information provided and after that summary. Follow-up after research is completed.

## 2015-04-28 NOTE — Progress Notes (Signed)
Pre visit review using our clinic review tool, if applicable. No additional management support is needed unless otherwise documented below in the visit note. 

## 2015-04-28 NOTE — Assessment & Plan Note (Signed)
Endometrial polyp removed on Monday by gynecology and has had increased bleeding since that time. Obtain hemoglobin to check for blood loss. Patient instructed to contact GYN office at earliest convenience to discuss. If symptoms worsen or dizziness develops recommend emergent care.

## 2015-04-28 NOTE — Assessment & Plan Note (Signed)
Elevated A1c of 6.0 noted on blood work prior to surgery. Discussed importance of improving diet and decreasing saturated fat while also increasing physical activity to 30 minutes most days a week. Follow-up pending lifestyle changes.

## 2015-04-28 NOTE — Telephone Encounter (Signed)
Spoke with patient. Patient had D&C/Hysteroscopy on 04/25/2015 with Dr.Miller. Patient states that late Tuesday 7/12 she began to have bleeding. Patient is changing her pad every hour. Denies feeling fatigued, weak, dizzy, or short of breath. Denies any pain or discomfort. Advised will speak with Dr.Miller regarding bleeding and return call with any further recommendations. Patient is agreeable.

## 2015-04-29 NOTE — Anesthesia Postprocedure Evaluation (Signed)
  Anesthesia Post-op Note  Patient: Candace Graham  Procedure(s) Performed: Procedure(s): DILATATION & CURETTAGE/HYSTEROSCOPY WITH RESECTOCOPE (N/A) Patient is awake and responsive. Pain and nausea are reasonably well controlled. Vital signs are stable and clinically acceptable. Oxygen saturation is clinically acceptable. There are no apparent anesthetic complications at this time. Patient is ready for discharge.

## 2015-05-10 ENCOUNTER — Telehealth: Payer: Self-pay | Admitting: Obstetrics & Gynecology

## 2015-05-10 ENCOUNTER — Ambulatory Visit: Payer: BLUE CROSS/BLUE SHIELD | Admitting: Obstetrics & Gynecology

## 2015-05-10 NOTE — Telephone Encounter (Signed)
Patient missed her 2 week post op appointment today with Dr.Miller. Patient said "I was not aware of this appointment". Patient said "I was not made aware I needed follow up".  I rescheduled her appointment to 05/13/15 @2 ;30pm.

## 2015-05-12 NOTE — Telephone Encounter (Signed)
lmtcb to reschedule 05/13/15 appointment.//kn

## 2015-05-12 NOTE — Telephone Encounter (Signed)
Please adjust schedule.  Pt's follow up was on her discharge information.  Pt can review there.

## 2015-05-13 ENCOUNTER — Ambulatory Visit: Payer: BLUE CROSS/BLUE SHIELD | Admitting: Obstetrics & Gynecology

## 2015-05-13 NOTE — Telephone Encounter (Signed)
Appointment rescheduled to 05/17/15.//kn

## 2015-05-17 ENCOUNTER — Encounter: Payer: Self-pay | Admitting: Obstetrics & Gynecology

## 2015-05-17 ENCOUNTER — Ambulatory Visit (INDEPENDENT_AMBULATORY_CARE_PROVIDER_SITE_OTHER): Payer: BLUE CROSS/BLUE SHIELD | Admitting: Obstetrics & Gynecology

## 2015-05-17 VITALS — BP 128/78 | HR 64 | Resp 16 | Wt 229.0 lb

## 2015-05-17 DIAGNOSIS — N924 Excessive bleeding in the premenopausal period: Secondary | ICD-10-CM | POA: Diagnosis not present

## 2015-05-17 DIAGNOSIS — N840 Polyp of corpus uteri: Secondary | ICD-10-CM | POA: Diagnosis not present

## 2015-05-17 MED ORDER — NORETHINDRONE 0.35 MG PO TABS: 1.0000 | ORAL_TABLET | Freq: Every day | ORAL | Status: DC

## 2015-05-17 NOTE — Progress Notes (Signed)
Post Operative Visit  Procedure:D&C/Hysteroscopy with Resectocope  Days Post-op: 3 weeks  Subjective: Doing well.  Pt reports having a regular cycle that started three days after the procedure.  Has had no additional bleeding.  Denies pain.  Denies bladder/urinary issues.  Pt and I reviewed pathology results.    With [facility] of 39 earlier this year, pathology showing degenerating secretory endometrium and irregular bleeding, feel she needs either cycle regular with either OCPs, progestin only pills, or cyclic progesterone.  Side effects, risks and benefits discussed with each.  Pt does not want to be on an estrogen containing pill again.  Was on Loestrin 1/20 and had side effects.  Objective: BP 128/78 mmHg  Pulse 64  Resp 16  Wt 229 lb (103.874 kg)  LMP 08/15/2014  EXAM General: alert and cooperative GI: soft, non-tender; bowel sounds normal; no masses,  no organomegaly  Gyn:  NAEFG, vaginal without discharge or blood, cervix close, no CMT Extremities: extremities normal, atraumatic, no cyanosis or edema Vaginal Bleeding: none  Assessment: s/p hysteroscopy, polyp resection, D&C with pathology showing degenerating secretory endometrium FSH 39 earlier this year  Plan: Restart micronor.  Rx to pharmacy.  Pt advised to call with any heavy or irregular bleeding. F/u for AEX as planned.

## 2015-05-18 ENCOUNTER — Telehealth: Payer: Self-pay | Admitting: *Deleted

## 2015-05-18 MED ORDER — METFORMIN HCL 500 MG PO TABS: 500.0000 mg | ORAL_TABLET | Freq: Two times a day (BID) | ORAL | Status: DC

## 2015-05-18 NOTE — Telephone Encounter (Signed)
Medication has been sent.  

## 2015-05-18 NOTE — Telephone Encounter (Signed)
Received call pt states wanted to inform Marya Amsler that she saw Dr. Sabra Heck on yesterday. Would like for Greg to rx the metformin that they discuss. [facility]...Johny Chess

## 2015-05-19 NOTE — Telephone Encounter (Signed)
Called pt no answer [facility] Greg [facility].../lmb

## 2015-06-23 ENCOUNTER — Other Ambulatory Visit (INDEPENDENT_AMBULATORY_CARE_PROVIDER_SITE_OTHER): Payer: BLUE CROSS/BLUE SHIELD

## 2015-06-23 DIAGNOSIS — E559 Vitamin D deficiency, unspecified: Secondary | ICD-10-CM

## 2015-06-24 ENCOUNTER — Telehealth: Payer: Self-pay | Admitting: Obstetrics & Gynecology

## 2015-06-24 ENCOUNTER — Encounter (HOSPITAL_COMMUNITY): Payer: Self-pay | Admitting: Emergency Medicine

## 2015-06-24 ENCOUNTER — Emergency Department (HOSPITAL_COMMUNITY)
Admission: EM | Admit: 2015-06-24 | Discharge: 2015-06-24 | Discharge status: Against Medical Advice | Payer: BLUE CROSS/BLUE SHIELD | Attending: Emergency Medicine | Admitting: Emergency Medicine

## 2015-06-24 DIAGNOSIS — Z5321 Procedure and treatment not carried out due to patient leaving prior to being seen by health care provider: Secondary | ICD-10-CM | POA: Insufficient documentation

## 2015-06-24 DIAGNOSIS — Z8742 Personal history of other diseases of the female genital tract: Secondary | ICD-10-CM | POA: Insufficient documentation

## 2015-06-24 DIAGNOSIS — Z862 Personal history of diseases of the blood and blood-forming organs and certain disorders involving the immune mechanism: Secondary | ICD-10-CM | POA: Insufficient documentation

## 2015-06-24 DIAGNOSIS — Z79899 Other long term (current) drug therapy: Secondary | ICD-10-CM | POA: Insufficient documentation

## 2015-06-24 LAB — CBC
HCT: 36.3 % (ref 36.0–46.0)
Hemoglobin: 11.4 g/dL — ABNORMAL LOW (ref 12.0–15.0)
MCH: 25.4 pg — ABNORMAL LOW (ref 26.0–34.0)
MCHC: 31.4 g/dL (ref 30.0–36.0)
MCV: 80.8 fL (ref 78.0–100.0)
Platelets: 321 10*3/uL (ref 150–400)
RBC: 4.49 MIL/uL (ref 3.87–5.11)
RDW: 14 % (ref 11.5–15.5)
WBC: 6.1 10*3/uL (ref 4.0–10.5)

## 2015-06-24 LAB — BASIC METABOLIC PANEL
Anion gap: 6 (ref 5–15)
BUN: 15 mg/dL (ref 6–20)
CO2: 24 mmol/L (ref 22–32)
Calcium: 9.1 mg/dL (ref 8.9–10.3)
Chloride: 109 mmol/L (ref 101–111)
Creatinine, Ser: 0.74 mg/dL (ref 0.44–1.00)
GFR calc Af Amer: >60 mL/min (ref >60)
GFR calc non Af Amer: >60 mL/min (ref >60)
Glucose, Bld: 96 mg/dL (ref 65–99)
Potassium: 4 mmol/L (ref 3.5–5.1)
Sodium: 139 mmol/L (ref 135–145)

## 2015-06-24 LAB — I-STAT TROPONIN, ED: Troponin i, poc: 0.01 ng/mL (ref 0.00–0.08)

## 2015-06-24 LAB — VITAMIN D 25 HYDROXY (VIT D DEFICIENCY, FRACTURES): Vit D, 25-Hydroxy: 25 ng/mL — ABNORMAL LOW (ref 30–100)

## 2015-06-24 LAB — I-STAT BETA HCG BLOOD, ED (MC, WL, AP ONLY): I-stat hCG, quantitative: <5 m[IU]/mL (ref <5)

## 2015-06-24 NOTE — ED Notes (Addendum)
Pt reports CP that began at 1600 today with lightheadedness and L arm tingling. Pain worse under L collarbone. No SOB. Has been on a new medication for endometriosis for the past month

## 2015-06-24 NOTE — Telephone Encounter (Signed)
Spoke with patient. Started Micronor on 05/17/2015. Patient states that she began to notice off and on swelling in her left hand with tingling and tingling in her legs. Patient felt it may be her Metformin. Stopped taking Metformin for one week but intermittent swelling and tingling persisted. Has continued to take Micronor. Took Micronor today and tingling began again. Also states she has been gaining weight since starting POP. Denies any pain in arm or leg. Denies any shortness of breath or chest pain. "I know Dr.Miller said this would prevent me from having another procedure in the future but this is just not worth it." Patient is requesting an alternative. Advised I will speak with Dr.Miller regarding further recommendations and return call. Patient is agreeable.

## 2015-06-24 NOTE — ED Notes (Addendum)
Pt reports she needs to go because she has a son. Pt alert,oriented, and ambulatory upon DC.

## 2015-06-24 NOTE — ED Provider Notes (Signed)
CSN: [id number]     Arrival date & time 06/24/15  1857 History   First MD Initiated Contact with Patient 06/24/15 2231     Chief Complaint  Patient presents with  . Chest Pain  . Arm Pain     (Consider location/radiation/quality/duration/timing/severity/associated sxs/prior Treatment) HPI Candace Graham is a 44 y.o. female with recent D&C on 04/25/15, comes in for evaluation of chest pain. Patient reports intermittently for the past week she has had a dull ache under her left collarbone and intermittent left hand tingling. The symptoms have been independent up on another. The discomfort is nonexertional. She denies any shortness of breath, nausea or vomiting, diaphoresis. Denies any discomfort now in ED. Cannot recall last chest pain, but believes it is associated with birth control medications she started taking. Denies any recent travel, unilateral leg swelling, hemoptysis, history of blood clot. Nothing makes the problem better or worse. No other aggravating or modifying factors. She does report she has a follow-up appointment with her OB/GYN on Monday Past Medical History  Diagnosis Date  . Abnormal uterine bleeding     Heavy menstrual cycles  . Anemia   . Vaginal delivery 1991, 1999   Past Surgical History  Procedure Laterality Date  . No past surgeries    . Dilatation & currettage/hysteroscopy with resectocope N/A 04/25/2015    Procedure: DILATATION & CURETTAGE/HYSTEROSCOPY WITH RESECTOCOPE;  Surgeon: Megan Salon, MD;  Location: [facility];  Service: Gynecology;  Laterality: N/A;  . Uterine polyp removal     Family History  Problem Relation Age of Onset  . Pancreatic cancer Mother   . Hypertension Mother   . Stroke Mother   . Thyroid disease Mother   . Hypertension Brother   . Hypertension Brother   . Heart disease Father   . Arthritis Father   . Alcohol abuse Maternal Grandmother   . Esophageal cancer Maternal Grandmother   . Drug abuse Maternal Grandfather    Social  History  Substance Use Topics  . Smoking status: Never Smoker   . Smokeless tobacco: Never Used  . Alcohol Use: No   OB History    Gravida Para Term Preterm AB TAB SAB Ectopic Multiple Living   4 2 2  2 2    2      Review of Systems A 10 point review of systems was completed and was negative except for pertinent positives and negatives as mentioned in the history of present illness     Allergies  Review of patient's allergies indicates no known allergies.  Home Medications   Prior to Admission medications   Medication Sig Start Date End Date Taking? Authorizing Provider  metFORMIN (GLUCOPHAGE) 500 MG tablet Take 1 tablet (500 mg total) by mouth 2 (two) times daily with a meal. 05/18/15  Yes Golden Circle, FNP  norethindrone (MICRONOR,CAMILA,ERRIN) 0.35 MG tablet Take 1 tablet (0.35 mg total) by mouth daily. 05/17/15  Yes Megan Salon, MD  Vitamin D, Ergocalciferol, (DRISDOL) 50000 UNITS CAPS capsule Take 1 capsule (50,000 Units total) by mouth every 7 (seven) days. 03/18/15  Yes Regina Eck, CNM   BP 131/72 mmHg  Pulse 87  Temp(Src) 98.2 F (36.8 C) (Oral)  Resp 18  SpO2 97%  LMP 08/15/2014 Physical Exam  ED Course  Procedures (including critical care time) Labs Review Labs Reviewed  CBC - Abnormal; Notable for the following:    Hemoglobin 11.4 (*)    MCH 25.4 (*)    All other components within  normal limits  BASIC METABOLIC PANEL  I-STAT TROPOININ, ED  I-STAT TROPOININ, ED    Imaging Review No results found. I have personally reviewed and evaluated these images and lab results as part of my medical decision-making.   EKG Interpretation None     Filed Vitals:   06/24/15 1905 06/24/15 2259  BP: 133/83 131/72  Pulse: 92 87  Temp: 98.1 F (36.7 C) 98.2 F (36.8 C)  TempSrc: Oral Oral  Resp: 16 18  SpO2: 96% 97%    MDM  Patient eloped without letting me know prior to her leaving. Left before physical exam. Unable to complete evaluation. Patient  does report that she has a follow-up appointment with her OB/GYN on Monday. Final diagnoses:  None        Comer Locket, PA-C 06/25/15 0018  Sharlett Iles, MD 06/25/15 Laureen Abrahams

## 2015-06-24 NOTE — Telephone Encounter (Signed)
Patient states that the birth control that Dr. Sabra Heck prescribed her is not working. Best # to reach [phone]

## 2015-06-24 NOTE — Telephone Encounter (Addendum)
Spoke with patient after speaking with Dr.Silva regarding patient. Advised patient she will need to be seen at local ER [facility] or [facility] for further evaluation of the swelling in her arm. Advised will need to rule out thrombus that may be causing her swelling. Patient is agreeable and states she will go to the ER today. Will follow up with our office on Monday.  Cc: Dr.Miller  Routing to Dr.Silva for review.

## 2015-06-25 NOTE — Telephone Encounter (Signed)
Reviewed ED note.  Pt did have normal EKG.  She left before fully being seen by ER physician.  Encounter closed.

## 2015-07-21 ENCOUNTER — Telehealth: Payer: Self-pay | Admitting: Obstetrics & Gynecology

## 2015-07-21 NOTE — Telephone Encounter (Signed)
Spoke with patient. She state she she discontinued her Micronor on her on on the day she went to the emergency department 06/24/15. Her symptoms of weight gain, chest pain, swelling of extremities stopped within days of stopping Micronor.  She states she has had a cycle this month but it was much lighter than normal.  Would like advice from Dr. Sabra Heck. Declines office visit at this time.

## 2015-07-21 NOTE — Telephone Encounter (Signed)
Patient wants to talk with Dr. Sabra Heck. She states she cannot take the hormones and she ended up in the ER. She wants to discuss what to do next.

## 2015-07-21 NOTE — Telephone Encounter (Signed)
Ok to not restart micronor and just proceed with watchful waiting with cycles.  If desires contraception, should use condoms.  Also, should call if doesn't have a cycle for >90 days.

## 2015-07-22 NOTE — Telephone Encounter (Signed)
Called patient and message from Dr. Sabra Heck given. Patient verbalized understanding of instructions, Will keep bleeding calendar and knows to call if does not have a cycle for 3 months. Will use condoms for contraception.  Routing to provider for final review. Patient agreeable to disposition. Will close encounter.

## 2015-10-18 ENCOUNTER — Encounter: Payer: Self-pay | Admitting: Obstetrics & Gynecology

## 2015-10-18 ENCOUNTER — Ambulatory Visit: Payer: BLUE CROSS/BLUE SHIELD | Admitting: Obstetrics & Gynecology

## 2015-10-18 ENCOUNTER — Telehealth: Payer: Self-pay | Admitting: Obstetrics & Gynecology

## 2015-10-18 NOTE — Telephone Encounter (Signed)
Patient did not keep her appointment for today with Dr. Sabra Heck for "irregular cycles." I called the patient and left a message to call back to reschedule.

## 2015-10-19 NOTE — Telephone Encounter (Signed)
Patient called back and said, "I missed my appointment because I don't have my insurance card. I won't be able to reschedule until it comes in the mail. I hope it will come soon then I will call back. Please let Dr. Sabra Heck know."

## 2016-01-06 ENCOUNTER — Ambulatory Visit (INDEPENDENT_AMBULATORY_CARE_PROVIDER_SITE_OTHER): Payer: BLUE CROSS/BLUE SHIELD | Admitting: Obstetrics & Gynecology

## 2016-01-06 ENCOUNTER — Other Ambulatory Visit: Payer: Self-pay | Admitting: Obstetrics & Gynecology

## 2016-01-06 VITALS — BP 102/80 | HR 76 | Resp 16 | Ht 67.25 in | Wt 232.0 lb

## 2016-01-06 DIAGNOSIS — N912 Amenorrhea, unspecified: Secondary | ICD-10-CM

## 2016-01-06 DIAGNOSIS — E669 Obesity, unspecified: Secondary | ICD-10-CM | POA: Diagnosis not present

## 2016-01-06 LAB — FOLLICLE STIMULATING HORMONE: FSH: 58.2 m[IU]/mL

## 2016-01-06 LAB — TSH: TSH: 1.51 mIU/L

## 2016-01-06 MED ORDER — PHENTERMINE HCL 15 MG PO CAPS: 15.0000 mg | ORAL_CAPSULE | ORAL | Status: DC

## 2016-01-06 NOTE — Progress Notes (Signed)
GYNECOLOGY  VISIT   HPI: 45 y.o. [id number] Single African American female here for follow-up after having a hysteroscopy and D&C in 7/16.  Pt did come for post op visit but did not follow up after that time.  She was started on micronor due to her irregular bleeding.  Pt did have one "regular" cycle after the hysteroscopy.  She reports she did not like the way she felt on the micronor so she stopped it.  She has not had any vaginal bleeding since then.  She is worried this could mean something "bad" and she is a little anxious today.    Pt had an [place] obtained on 04/10/15 that was 39.3.  She and I discussed menopause and menopausal symptoms.  It is likely that she's just gone ahead into menopause due to the elevated [facility] almost a year ago.  Pt denies hot flashes or night sweats.  Her biggest concern is her weight.  She feels she's gained about 40 pounds over the last two years and really wants to work on getting this off.  In review of prior vitals, she weight 208 on 8/14.  She is 232# today.  This is a gain of 24#.  Discussed with pt diet programs and possible weight loss medications.  She would be interested in this.  Phentermine, specifically, discussed.  Dizziness, hypertension, stroke, insomnia, jitters, and pulmonary hypertension all reviewed.  Feel pt needs to also engage in active weight loss program like weight watchers as well as an exercise program working towards 150 minutes of cardio a day.  First, should check TSH and FSH and confirm cause of amenorrhea.  Pt in agreement.  GYNECOLOGIC HISTORY: Patient's last menstrual period was 08/15/2014. Contraception: none Menopausal hormone therapy: none  Patient Active Problem List   Diagnosis Date Noted  . Back pain 04/28/2015  . Obesity 04/28/2015  . Elevated TSH 04/28/2015  . Prediabetes 04/28/2015  . Abnormal perimenopausal bleeding 04/19/2015  . Endometrial mass 04/19/2015  . Anemia, iron deficiency 02/16/2013  . Fibroids 02/16/2013     Past Medical History  Diagnosis Date  . Abnormal uterine bleeding     Heavy menstrual cycles  . Anemia   . Vaginal delivery 1991, 1999    Past Surgical History  Procedure Laterality Date  . No past surgeries    . Dilatation & currettage/hysteroscopy with resectocope N/A 04/25/2015    Procedure: DILATATION & CURETTAGE/HYSTEROSCOPY WITH RESECTOCOPE;  Surgeon: Megan Salon, MD;  Location: [facility];  Service: Gynecology;  Laterality: N/A;  . Uterine polyp removal      MEDS:  Reviewed in EPIC and UTD  ALLERGIES: Review of patient's allergies indicates no known allergies.  Family History  Problem Relation Age of Onset  . Pancreatic cancer Mother   . Hypertension Mother   . Stroke Mother   . Thyroid disease Mother   . Hypertension Brother   . Hypertension Brother   . Heart disease Father   . Arthritis Father   . Alcohol abuse Maternal Grandmother   . Esophageal cancer Maternal Grandmother   . Drug abuse Maternal Grandfather     SH:  In long term relationship, non smoker  ROS  PHYSICAL EXAMINATION:    BP 102/80 mmHg  Pulse 76  Resp 16  Ht 5' 7.25" (1.708 m)  Wt 232 lb (105.235 kg)  BMI 36.07 kg/m2  LMP 08/15/2014    General appearance: alert, cooperative and appears stated age Neck: no adenopathy, supple, symmetrical, trachea midline and thyroid normal  to inspection and palpation CV:  Regular rate and rhythm Lungs:  clear to auscultation, no wheezes, rales or rhonchi, symmetric air entry No other physical exam was performed.   Assessment: Amenorrhea, probably due to menopause About 25# weight gain  Plan: TSH and FSH, prolactin, HCG qualitative Will consider starting phentermine after the results from above labs work is back.  Rx for 15mg  daily, taken in am, given.  Pt will be called with results and if all of this is as expected, she can go ahead and start.  Will plan follow-up one month.   ~15 minutes spent with patient >50% of time was in face to face  discussion of above.

## 2016-01-08 ENCOUNTER — Encounter: Payer: Self-pay | Admitting: Obstetrics & Gynecology

## 2016-01-09 LAB — HCG, SERUM, QUALITATIVE: Preg, Serum: NEGATIVE

## 2016-01-09 LAB — PROLACTIN: Prolactin: 5.1 ng/mL

## 2016-02-07 ENCOUNTER — Ambulatory Visit: Payer: BLUE CROSS/BLUE SHIELD | Admitting: Dietician

## 2016-03-20 ENCOUNTER — Encounter: Payer: Self-pay | Admitting: Certified Nurse Midwife

## 2016-03-20 ENCOUNTER — Ambulatory Visit (INDEPENDENT_AMBULATORY_CARE_PROVIDER_SITE_OTHER): Payer: BLUE CROSS/BLUE SHIELD | Admitting: Certified Nurse Midwife

## 2016-03-20 VITALS — BP 110/70 | HR 70 | Resp 16 | Ht 67.0 in | Wt 226.0 lb

## 2016-03-20 DIAGNOSIS — Z124 Encounter for screening for malignant neoplasm of cervix: Secondary | ICD-10-CM

## 2016-03-20 DIAGNOSIS — Z0000 Encounter for general adult medical examination without abnormal findings: Secondary | ICD-10-CM | POA: Diagnosis not present

## 2016-03-20 DIAGNOSIS — Z01419 Encounter for gynecological examination (general) (routine) without abnormal findings: Secondary | ICD-10-CM

## 2016-03-20 LAB — COMPREHENSIVE METABOLIC PANEL
ALT: 23 U/L (ref 6–29)
AST: 18 U/L (ref 10–35)
Albumin: 4 g/dL (ref 3.6–5.1)
Alkaline Phosphatase: 94 U/L (ref 33–115)
BUN: 8 mg/dL (ref 7–25)
CO2: 27 mmol/L (ref 20–31)
Calcium: 9.3 mg/dL (ref 8.6–10.2)
Chloride: 104 mmol/L (ref 98–110)
Creat: 0.64 mg/dL (ref 0.50–1.10)
Glucose, Bld: 87 mg/dL (ref 65–99)
Potassium: 4.1 mmol/L (ref 3.5–5.3)
Sodium: 142 mmol/L (ref 135–146)
Total Bilirubin: 0.4 mg/dL (ref 0.2–1.2)
Total Protein: 7.2 g/dL (ref 6.1–8.1)

## 2016-03-20 LAB — CBC
HCT: 37.7 % (ref 35.0–45.0)
Hemoglobin: 12.2 g/dL (ref 11.7–15.5)
MCH: 25.4 pg — ABNORMAL LOW (ref 27.0–33.0)
MCHC: 32.4 g/dL (ref 32.0–36.0)
MCV: 78.4 fL — ABNORMAL LOW (ref 80.0–100.0)
MPV: 9.8 fL (ref 7.5–12.5)
Platelets: 327 10*3/uL (ref 140–400)
RBC: 4.81 MIL/uL (ref 3.80–5.10)
RDW: 14.7 % (ref 11.0–15.0)
WBC: 5.6 10*3/uL (ref 3.8–10.8)

## 2016-03-20 LAB — POCT URINALYSIS DIPSTICK
Bilirubin, UA: NEGATIVE
Blood, UA: NEGATIVE
Glucose, UA: NEGATIVE
Ketones, UA: NEGATIVE
Leukocytes, UA: NEGATIVE
Nitrite, UA: NEGATIVE
Protein, UA: NEGATIVE
Urobilinogen, UA: NEGATIVE
pH, UA: 5

## 2016-03-20 LAB — LIPID PANEL
Cholesterol: 191 mg/dL (ref 125–200)
HDL: 51 mg/dL (ref >=46)
LDL Cholesterol: 123 mg/dL (ref <130)
Total CHOL/HDL Ratio: 3.7 Ratio (ref <=5.0)
Triglycerides: 86 mg/dL (ref <150)
VLDL: 17 mg/dL (ref <30)

## 2016-03-20 LAB — TSH: TSH: 2.25 mIU/L

## 2016-03-20 MED ORDER — PHENTERMINE HCL 15 MG PO CAPS: 15.0000 mg | ORAL_CAPSULE | ORAL | Status: DC

## 2016-03-20 NOTE — Patient Instructions (Signed)

## 2016-03-20 NOTE — Progress Notes (Addendum)
45 y.o. [id number] Single  African american Fe here for annual exam.   Periods none since 09/15/15. Having hot flashes and night sweats, using black cohosh with good results. No partner change, no STD screening needed. Continues to work on weight loss and is down to 226 from 232 in the past 2 months. Using Phentermine with good results. Desires continuance. Denies any SOB, rapid heartbeat with use or faintness. Has not had any problems with use. Sees [facility] PCP as needed. Desires screening labs today. Has noted some back pain off and on low. No tingling or numbness in feet. Has not been exercising or stretching to help with. Busy with family and work. No other health concerns today. Son graduating from high school and has his own [employer] and mobile app!  LMP 09/15/15        Sexually active: Yes.    The current method of family planning is post menopausal status.    Exercising: No.  exercise Smoker:  no  Health Maintenance: Pap: 03-17-15 neg MMG: 02-17-14 category b density,birads 1:neg Colonoscopy: none BMD:  none TDaP: 2015 Shingles: no Pneumonia: no Hep C and HIV: done Labs: poct urine-, hgb- Self breast exam: done occ   reports that she has never smoked. She has never used smokeless tobacco. She reports that she does not drink alcohol or use illicit drugs.  Past Medical History  Diagnosis Date  . Abnormal uterine bleeding     Heavy menstrual cycles  . Anemia   . Vaginal delivery 1991, 1999    Past Surgical History  Procedure Laterality Date  . No past surgeries    . Dilatation & currettage/hysteroscopy with resectocope N/A 04/25/2015    Procedure: DILATATION & CURETTAGE/HYSTEROSCOPY WITH RESECTOCOPE;  Surgeon: Megan Salon, MD;  Location: [facility];  Service: Gynecology;  Laterality: N/A;  . Uterine polyp removal      Current Outpatient Prescriptions  Medication Sig Dispense Refill  . BLACK COHOSH PO Take by mouth 2 (two) times daily.    . phentermine 15 MG capsule Take 1  capsule (15 mg total) by mouth every morning. 30 capsule 0   No current facility-administered medications for this visit.    Family History  Problem Relation Age of Onset  . Pancreatic cancer Mother   . Hypertension Mother   . Stroke Mother   . Thyroid disease Mother   . Hypertension Brother   . Hypertension Brother   . Heart disease Father   . Arthritis Father   . Alcohol abuse Maternal Grandmother   . Esophageal cancer Maternal Grandmother   . Drug abuse Maternal Grandfather     ROS:  Pertinent items are noted in HPI.  Otherwise, a comprehensive ROS was negative.  Exam:   BP 110/70 mmHg  Pulse 70  Resp 16  Ht 5\' 7"  (1.702 m)  Wt 226 lb (102.513 kg)  BMI 35.39 kg/m2  LMP 08/15/2014 Height: 5\' 7"  (170.2 cm) Ht Readings from Last 3 Encounters:  03/20/16 5\' 7"  (1.702 m)  01/06/16 5' 7.25" (1.708 m)  04/28/15 5' 7.25" (1.708 m)    General appearance: alert, cooperative and appears stated age Head: Normocephalic, without obvious abnormality, atraumatic Neck: no adenopathy, supple, symmetrical, trachea midline and thyroid normal to inspection and palpation Lungs: clear to auscultation bilaterally Breasts: normal appearance, no masses or tenderness, No nipple retraction or dimpling, No nipple discharge or bleeding, No axillary or supraclavicular adenopathy Heart: regular rate and rhythm Abdomen: soft, non-tender; no masses,  no organomegaly  Extremities: extremities normal, atraumatic, no cyanosis or edema Skin: Skin color, texture, turgor normal. No rashes or lesions Lymph nodes: Cervical, supraclavicular, and axillary nodes normal. No abnormal inguinal nodes palpated Neurologic: Grossly normal   Pelvic: External genitalia:  no lesions              Urethra:  normal appearing urethra with no masses, tenderness or lesions              Bartholin's and Skene's: normal                 Vagina: normal appearing vagina with normal color and discharge, no lesions               Cervix: multiparous appearance, no cervical motion tenderness and no lesions              Pap taken: Yes.   Bimanual Exam:  Uterus:  enlarged, 14 week size, does not feel change from previous exam, non tender, no masses weeks size              Adnexa: normal adnexa and no mass, fullness, tenderness               Rectovaginal: Confirms               Anus:  normal sphincter tone, no lesions  Chaperone present: yes  A:  Well Woman with normal exam  Perimenopausal with amenorrhea since 09/14/16. [place] in 3/17, 58.2.  Weight loss with phenrtermine use and diet, desires continuation  Low back pain, no injury, or strain.  Screening labs  Mammogram due  P:   Reviewed health and wellness pertinent to exam  Discussed expectations with menopause and bleeding profile. Questions addressed. Given pamphlet regarding.  Discussed risk and benefits of Phentermine use and warning signs. Patient voiced understanding and will advise if occurs.  Rx Phentermine 15 mg daily in am see order  Discussed exercise and stretching to help with weight los and low back pain. If pain persists needs to follow up with PCP or orthopedic..  Labs: CBC, CMP, Lipid panel, Vit. D, TSH  Stressed importance of scheduling mammogram, patient plans to soon.  Pap smear as above with HPV reflex   counseled on breast self exam, mammography screening, STD prevention, HIV risk factors and prevention, menopause, adequate intake of calcium and vitamin D, diet and exercise  return annually or prn  An After Visit Summary was printed and given to the patient.

## 2016-03-21 ENCOUNTER — Other Ambulatory Visit: Payer: Self-pay

## 2016-03-21 DIAGNOSIS — E559 Vitamin D deficiency, unspecified: Secondary | ICD-10-CM

## 2016-03-21 LAB — VITAMIN D 25 HYDROXY (VIT D DEFICIENCY, FRACTURES): Vit D, 25-Hydroxy: 15 ng/mL — ABNORMAL LOW (ref 30–100)

## 2016-03-21 LAB — IPS PAP TEST WITH REFLEX TO HPV

## 2016-03-21 MED ORDER — VITAMIN D (ERGOCALCIFEROL) 1.25 MG (50000 UNIT) PO CAPS: 50000.0000 [IU] | ORAL_CAPSULE | ORAL | Status: DC

## 2016-03-21 NOTE — Progress Notes (Signed)
Reviewed personally.  M. Suzanne Avelardo Reesman, MD.  

## 2016-03-29 ENCOUNTER — Telehealth: Payer: Self-pay | Admitting: Certified Nurse Midwife

## 2016-03-29 NOTE — Telephone Encounter (Signed)
Spoke with pharmacy and rx was filled. Called pt to advise her rx was ready. -sco

## 2016-03-29 NOTE — Telephone Encounter (Signed)
Patient is checking on her request for phentermine 15 MG capsule. She states it was supposed to be sent to her pharmacy last week.

## 2016-05-02 ENCOUNTER — Ambulatory Visit (INDEPENDENT_AMBULATORY_CARE_PROVIDER_SITE_OTHER): Payer: BLUE CROSS/BLUE SHIELD | Admitting: Family

## 2016-05-02 ENCOUNTER — Encounter: Payer: Self-pay | Admitting: Family

## 2016-05-02 VITALS — BP 122/78 | HR 78 | Temp 98.2°F | Resp 14 | Ht 67.5 in | Wt 226.8 lb

## 2016-05-02 DIAGNOSIS — M545 Low back pain: Secondary | ICD-10-CM

## 2016-05-02 NOTE — Patient Instructions (Signed)
Thank you for choosing [facility].  Summary/Instructions:  They will call to schedule your appointment with orthopedics.  Ice and heat as needed.   Over the counter anti-inflammatories as needed.   If your symptoms worsen or fail to improve, please contact our office for further instruction, or in case of emergency go directly to the emergency room at the closest medical facility.

## 2016-05-02 NOTE — Progress Notes (Signed)
Pre visit review using our clinic review tool, if applicable. No additional management support is needed unless otherwise documented below in the visit note. 

## 2016-05-02 NOTE — Progress Notes (Signed)
   Subjective:    Patient ID: Candace Graham, female    DOB: 1971/05/18, 45 y.o.   MRN: [id number]  Chief Complaint  Patient presents with  . Follow-up    pain still in lower left flank and back - wants referral    HPI:  Candace Graham is a 45 y.o. female who  has a past medical history of Abnormal uterine bleeding; Anemia; and Vaginal delivery (1991, 1999). and presents today for a follow up.   1.) Back pain - Continues to experience the associated symptom of pain located in her lower back that has been going on for a year and a few months. Pain is described as a burning pain.  Side pain is described as constant but not present at current time. Denies any trauma. There is no radiulopathies. Denies changes to bowel or bladder habits. Has not attempted any treatments since she was last seen about 1 year ago.  No Known Allergies   Current Outpatient Prescriptions on File Prior to Visit  Medication Sig Dispense Refill  . BLACK COHOSH PO Take by mouth 2 (two) times daily.    . phentermine 15 MG capsule Take 1 capsule (15 mg total) by mouth every morning. 30 capsule 0  . Vitamin D, Ergocalciferol, (DRISDOL) 50000 units CAPS capsule Take 1 capsule (50,000 Units total) by mouth every 7 (seven) days. 12 capsule 0   No current facility-administered medications on file prior to visit.    Review of Systems  Constitutional: Negative for fever and chills.  Genitourinary: Negative for dysuria, urgency, frequency, hematuria and flank pain.  Musculoskeletal: Positive for back pain.      Objective:    BP 122/78 mmHg  Pulse 78  Temp(Src) 98.2 F (36.8 C) (Oral)  Resp 14  Ht 5' 7.5" (1.715 m)  Wt 226 lb 12.8 oz (102.876 kg)  BMI 34.98 kg/m2  SpO2 97%  LMP 08/15/2014 Nursing note and vital signs reviewed.  Physical Exam  Constitutional: She is oriented to person, place, and time. She appears well-developed and well-nourished. No distress.  Cardiovascular: Normal rate, regular  rhythm, normal heart sounds and intact distal pulses.   Pulmonary/Chest: Effort normal and breath sounds normal.  Musculoskeletal:  Low back/left side - no obvious deformity, discoloration, or edema. Tenderness elicited over left sacroiliac joint. Range of motion is within normal limits with no discomfort. Distal pulses and sensation are intact and appropriate. Negative straight leg raise and Faber's test.  Neurological: She is alert and oriented to person, place, and time.  Skin: Skin is warm and dry.  Psychiatric: She has a normal mood and affect. Her behavior is normal. Judgment and thought content normal.       Assessment & Plan:   Problem List Items Addressed This Visit      Other   Back pain - Primary    Continues to experience back pain with good range of motion and as she describes deep inside. Does not appear to be renal in nature. No treatment has been performed since previous evaluation. Recommend ice/heat and home exercise therapy. Referral to orthopedics place per patient request.      Relevant Orders   AMB referral to orthopedics       I am having Ms. Sappenfield maintain her BLACK COHOSH PO, phentermine, and Vitamin D (Ergocalciferol).   Follow-up: No Follow-up on file.  Mauricio Po, FNP

## 2016-05-02 NOTE — Assessment & Plan Note (Signed)
Continues to experience back pain with good range of motion and as she describes deep inside. Does not appear to be renal in nature. No treatment has been performed since previous evaluation. Recommend ice/heat and home exercise therapy. Referral to orthopedics place per patient request.

## 2016-05-10 ENCOUNTER — Other Ambulatory Visit: Payer: Self-pay | Admitting: Gastroenterology

## 2016-05-10 DIAGNOSIS — R1032 Left lower quadrant pain: Secondary | ICD-10-CM

## 2016-05-10 DIAGNOSIS — R140 Abdominal distension (gaseous): Secondary | ICD-10-CM | POA: Diagnosis not present

## 2016-05-15 ENCOUNTER — Ambulatory Visit
Admission: RE | Admit: 2016-05-15 | Discharge: 2016-05-15 | Disposition: A | Discharge status: Home / Self Care | Payer: BLUE CROSS/BLUE SHIELD | Source: Ambulatory Visit | Attending: Gastroenterology | Admitting: Gastroenterology

## 2016-05-15 ENCOUNTER — Telehealth: Payer: Self-pay | Admitting: Family

## 2016-05-15 DIAGNOSIS — N281 Cyst of kidney, acquired: Secondary | ICD-10-CM | POA: Diagnosis not present

## 2016-05-15 DIAGNOSIS — N2889 Other specified disorders of kidney and ureter: Secondary | ICD-10-CM

## 2016-05-15 DIAGNOSIS — R918 Other nonspecific abnormal finding of lung field: Secondary | ICD-10-CM

## 2016-05-15 DIAGNOSIS — R1032 Left lower quadrant pain: Secondary | ICD-10-CM

## 2016-05-15 MED ORDER — IOPAMIDOL (ISOVUE-300) INJECTION 61%
100.0000 mL | Freq: Once | INTRAVENOUS | Status: AC | PRN
  Administered 2016-05-15: 100 mL via INTRAVENOUS

## 2016-05-15 NOTE — Telephone Encounter (Signed)
The referral has been placed. I have also placed a CT scan of the chest for follow up of pulmonary nodules.

## 2016-05-15 NOTE — Telephone Encounter (Signed)
Patient states she was referred to an orthopedic surgeon for her back.  Patient did not see orthopedic surgeon.  She had a CT scan done.  The GI doctor found out that she has masses in her kidneys and lungs. Patient seen Dr. Collene Mares.  Dr. Collene Mares is to send this over to [facility].   Dr. Collene Mares told patient to call the office to get Marya Amsler to refer her to Dr. Florene Glen (kidney specialist) with [facility].  Patient is requesting urgent referral to be entered.  Thanks!

## 2016-05-16 NOTE — Telephone Encounter (Signed)
Pt aware and states that referral to kidney doctor can be canceled she found another kidney specialist.

## 2016-05-18 ENCOUNTER — Ambulatory Visit
Admission: RE | Admit: 2016-05-18 | Discharge: 2016-05-18 | Disposition: A | Discharge status: Home / Self Care | Payer: BLUE CROSS/BLUE SHIELD | Source: Ambulatory Visit | Attending: Family | Admitting: Family

## 2016-05-18 DIAGNOSIS — R918 Other nonspecific abnormal finding of lung field: Secondary | ICD-10-CM | POA: Diagnosis not present

## 2016-05-21 ENCOUNTER — Encounter: Payer: Self-pay | Admitting: Family

## 2016-05-21 ENCOUNTER — Other Ambulatory Visit: Payer: Self-pay | Admitting: Family

## 2016-05-21 DIAGNOSIS — R918 Other nonspecific abnormal finding of lung field: Secondary | ICD-10-CM

## 2016-05-23 ENCOUNTER — Other Ambulatory Visit: Payer: Self-pay | Admitting: Urology

## 2016-05-23 ENCOUNTER — Telehealth: Payer: Self-pay

## 2016-05-23 DIAGNOSIS — Q6100 Congenital renal cyst, unspecified: Secondary | ICD-10-CM | POA: Diagnosis not present

## 2016-05-23 DIAGNOSIS — N281 Cyst of kidney, acquired: Secondary | ICD-10-CM

## 2016-05-23 NOTE — Telephone Encounter (Signed)
Spoke with patient. Patient is concerned regarding recent findings on CT scan. "I know the doctor called and left a message for Dr.Miller so I just had a few questions." Asking if pathology was sent when she had a "endometrial mass" removed in 2016. Advised pathology was sent for endometrial polyp that was removed in 04/2015 which was benign. She is agreeable. Patient is asking if she needs to do anything further at this time with Dr.Miller. Reports she is in the process of scheduling her MRI. Advised I will speak with Dr.Miller regarding her recommendations and return call. Patient is agreeable.

## 2016-05-23 NOTE — Telephone Encounter (Signed)
Spoke with Dr.Dahlstedt from [facility] at time of incoming call. Dr.Dahlstedt reports that he has seen this patient and CT scan was performed that revealed a renal mass and numerous nodules in her lungs that are suspicious for metastatic disease. He reports that he is going to have an MRI performed to further evaluate the patient's kidney. States he will add imaging of the uterus to the MRI and would like Dr.Miller to review results as well due to the patient's history of fibroid changes and previous need for EMB. States that [place] may contact him directly to discuss further at [phone].

## 2016-05-25 ENCOUNTER — Ambulatory Visit (HOSPITAL_COMMUNITY)
Admission: RE | Admit: 2016-05-25 | Discharge: 2016-05-25 | Disposition: A | Discharge status: Home / Self Care | Payer: BLUE CROSS/BLUE SHIELD | Source: Ambulatory Visit | Attending: Urology | Admitting: Urology

## 2016-05-25 DIAGNOSIS — R935 Abnormal findings on diagnostic imaging of other abdominal regions, including retroperitoneum: Secondary | ICD-10-CM | POA: Insufficient documentation

## 2016-05-25 DIAGNOSIS — R918 Other nonspecific abnormal finding of lung field: Secondary | ICD-10-CM | POA: Diagnosis not present

## 2016-05-25 DIAGNOSIS — R1032 Left lower quadrant pain: Secondary | ICD-10-CM | POA: Diagnosis not present

## 2016-05-25 DIAGNOSIS — M899 Disorder of bone, unspecified: Secondary | ICD-10-CM | POA: Diagnosis not present

## 2016-05-25 DIAGNOSIS — N281 Cyst of kidney, acquired: Secondary | ICD-10-CM | POA: Insufficient documentation

## 2016-05-25 MED ORDER — GADOBENATE DIMEGLUMINE 529 MG/ML IV SOLN
20.0000 mL | Freq: Once | INTRAVENOUS | Status: AC | PRN
  Administered 2016-05-25: 20 mL via INTRAVENOUS

## 2016-05-29 ENCOUNTER — Institutional Professional Consult (permissible substitution): Payer: BLUE CROSS/BLUE SHIELD | Admitting: Internal Medicine

## 2016-05-29 NOTE — Telephone Encounter (Signed)
Patient called requesting to speak with the nurse about her MRI results from yesterday.

## 2016-05-29 NOTE — Telephone Encounter (Signed)
Routing to [place] for review and advise of MRI performed on 05/25/2016. Any further GYN evaluation needed at this time for this patient?

## 2016-05-30 NOTE — Telephone Encounter (Signed)
I called pt last night and spoke to her for over 30 minutes.  She needs urologic oncology consult and biopsy of lung nodule.

## 2016-05-30 NOTE — Telephone Encounter (Signed)
Spoke with patient. Advised I have spoken with Dr.Miller who has consulted with Dr.Dahlstedt. Per Dr.Dahlstedt there is less than 1 percent chance that the nodules in her lungs are a result of metastasis from the kidney due to the size of the kidney mass. Dr.Dahlstedt is going to contact interventional radiology today regarding setting the patient up for a biopsy of a mass in the lung. Dr.Dahlstedt will contact the patient directly today to discuss this further. The patient is agreeable and verbalizes understanding.

## 2016-06-01 ENCOUNTER — Telehealth: Payer: Self-pay | Admitting: *Deleted

## 2016-06-01 ENCOUNTER — Other Ambulatory Visit: Payer: Self-pay | Admitting: Urology

## 2016-06-01 ENCOUNTER — Encounter: Payer: Self-pay | Admitting: Obstetrics & Gynecology

## 2016-06-01 ENCOUNTER — Encounter: Payer: Self-pay | Admitting: Internal Medicine

## 2016-06-01 DIAGNOSIS — R911 Solitary pulmonary nodule: Secondary | ICD-10-CM

## 2016-06-01 NOTE — Telephone Encounter (Signed)
See phone message

## 2016-06-01 NOTE — Telephone Encounter (Signed)
See My Chart message from patient:    Hi Dr, Sabra Heck. I spoke with Dr. Diona Fanti and he wants me to have a CT needle guided biopsy. I read up information regarding sarcoidosis. I have had symptoms such as the rashes. They have been on my face on and off for some time and in other places. I simply attributed it to eczema and red scaly skin.   Additionally, I have been taking Vitamin D and I really don't understand all the medical jargon but it may have some barring on this should it be identified as sarcoidosis.  Dr. Diona Fanti seemed annoyed with my litany of questions. He cut them short and I felt like a number to him. Do you have someone who you could refer me to? A pulmonary specialist with a knowledge base to include carcinoma and sarcoidosis?

## 2016-06-01 NOTE — Telephone Encounter (Signed)
Call to patient to follow-up on My Chart message. Patient is also corresponding with Dr Chase Caller via My Chart. Patient states she is following-up with Dr Sabra Heck regarding earlier appointment since Dr Chase Caller cannot see her until 06-27-16. She is being told by Dr Marge Duncans she needs biopsy ASAP. Patient request to follow-up with Dr Sabra Heck who has been working with her.

## 2016-06-01 NOTE — Telephone Encounter (Signed)
Spoke with pt today.  Biopsy is scheduled for 06/08/16 with interventional radiology.  She is concerned about pneumothorax.  D/W pt her concerns.  She is comfortable with proceeding and I am in agreement.  Ok to close encounter.

## 2016-06-01 NOTE — Telephone Encounter (Signed)
Patient sent message through [place].  MR, please advise.

## 2016-06-02 NOTE — Telephone Encounter (Signed)
Please find out from Kyra Leyland what part of the body is being biopsied by CT guided biopsy procedure? I am uanble to figure out based on her email or the order in epic. Regardless, she will need some biopsy either lung or kidney through some method to find out what is going on.   Thanks  Dr. Brand Males, M.D., [facility].C.P Pulmonary and Critical Care Medicine Staff Physician [place] [facility] Pager: [phone], If no answer or between  15:00h - 7:00h: call [phone]  06/02/2016 1:04 AM

## 2016-06-04 NOTE — Telephone Encounter (Signed)
Note sent to patient for clarification.

## 2016-06-07 ENCOUNTER — Other Ambulatory Visit: Payer: Self-pay | Admitting: Radiology

## 2016-06-08 ENCOUNTER — Ambulatory Visit (HOSPITAL_COMMUNITY): Admission: RE | Admit: 2016-06-08 | Payer: BLUE CROSS/BLUE SHIELD | Source: Ambulatory Visit

## 2016-06-11 ENCOUNTER — Other Ambulatory Visit: Payer: Self-pay | Admitting: Radiology

## 2016-06-12 ENCOUNTER — Encounter (HOSPITAL_COMMUNITY): Payer: Self-pay

## 2016-06-12 ENCOUNTER — Ambulatory Visit (HOSPITAL_COMMUNITY)
Admission: RE | Admit: 2016-06-12 | Discharge: 2016-06-12 | Disposition: A | Discharge status: Home / Self Care | Payer: BLUE CROSS/BLUE SHIELD | Source: Ambulatory Visit | Attending: Urology | Admitting: Urology

## 2016-06-12 ENCOUNTER — Ambulatory Visit (HOSPITAL_COMMUNITY)
Admission: RE | Admit: 2016-06-12 | Discharge: 2016-06-12 | Disposition: A | Discharge status: Home / Self Care | Payer: BLUE CROSS/BLUE SHIELD | Source: Ambulatory Visit | Attending: Interventional Radiology | Admitting: Interventional Radiology

## 2016-06-12 DIAGNOSIS — Z9889 Other specified postprocedural states: Secondary | ICD-10-CM | POA: Insufficient documentation

## 2016-06-12 DIAGNOSIS — J8410 Pulmonary fibrosis, unspecified: Secondary | ICD-10-CM | POA: Insufficient documentation

## 2016-06-12 DIAGNOSIS — R911 Solitary pulmonary nodule: Secondary | ICD-10-CM

## 2016-06-12 LAB — CBC
HCT: 39 % (ref 36.0–46.0)
Hemoglobin: 12.3 g/dL (ref 12.0–15.0)
MCH: 25.1 pg — ABNORMAL LOW (ref 26.0–34.0)
MCHC: 31.5 g/dL (ref 30.0–36.0)
MCV: 79.6 fL (ref 78.0–100.0)
Platelets: 316 10*3/uL (ref 150–400)
RBC: 4.9 MIL/uL (ref 3.87–5.11)
RDW: 14.3 % (ref 11.5–15.5)
WBC: 6.2 10*3/uL (ref 4.0–10.5)

## 2016-06-12 LAB — PROTIME-INR
INR: 1.01
Prothrombin Time: 13.3 seconds (ref 11.4–15.2)

## 2016-06-12 LAB — APTT: aPTT: 35 seconds (ref 24–36)

## 2016-06-12 IMAGING — DX DG CHEST 1V PORT
1 series · 1 of 1 positions shown · non-contrast
Comparison: Chest CT - [DATE]; CT-guided left lower lobe
pulmonary nodule biopsy - earlier same day

CLINICAL DATA: Post left lower lobe pulmonary nodule biopsy.

EXAM:
PORTABLE CHEST 1 VIEW

[chest ap]
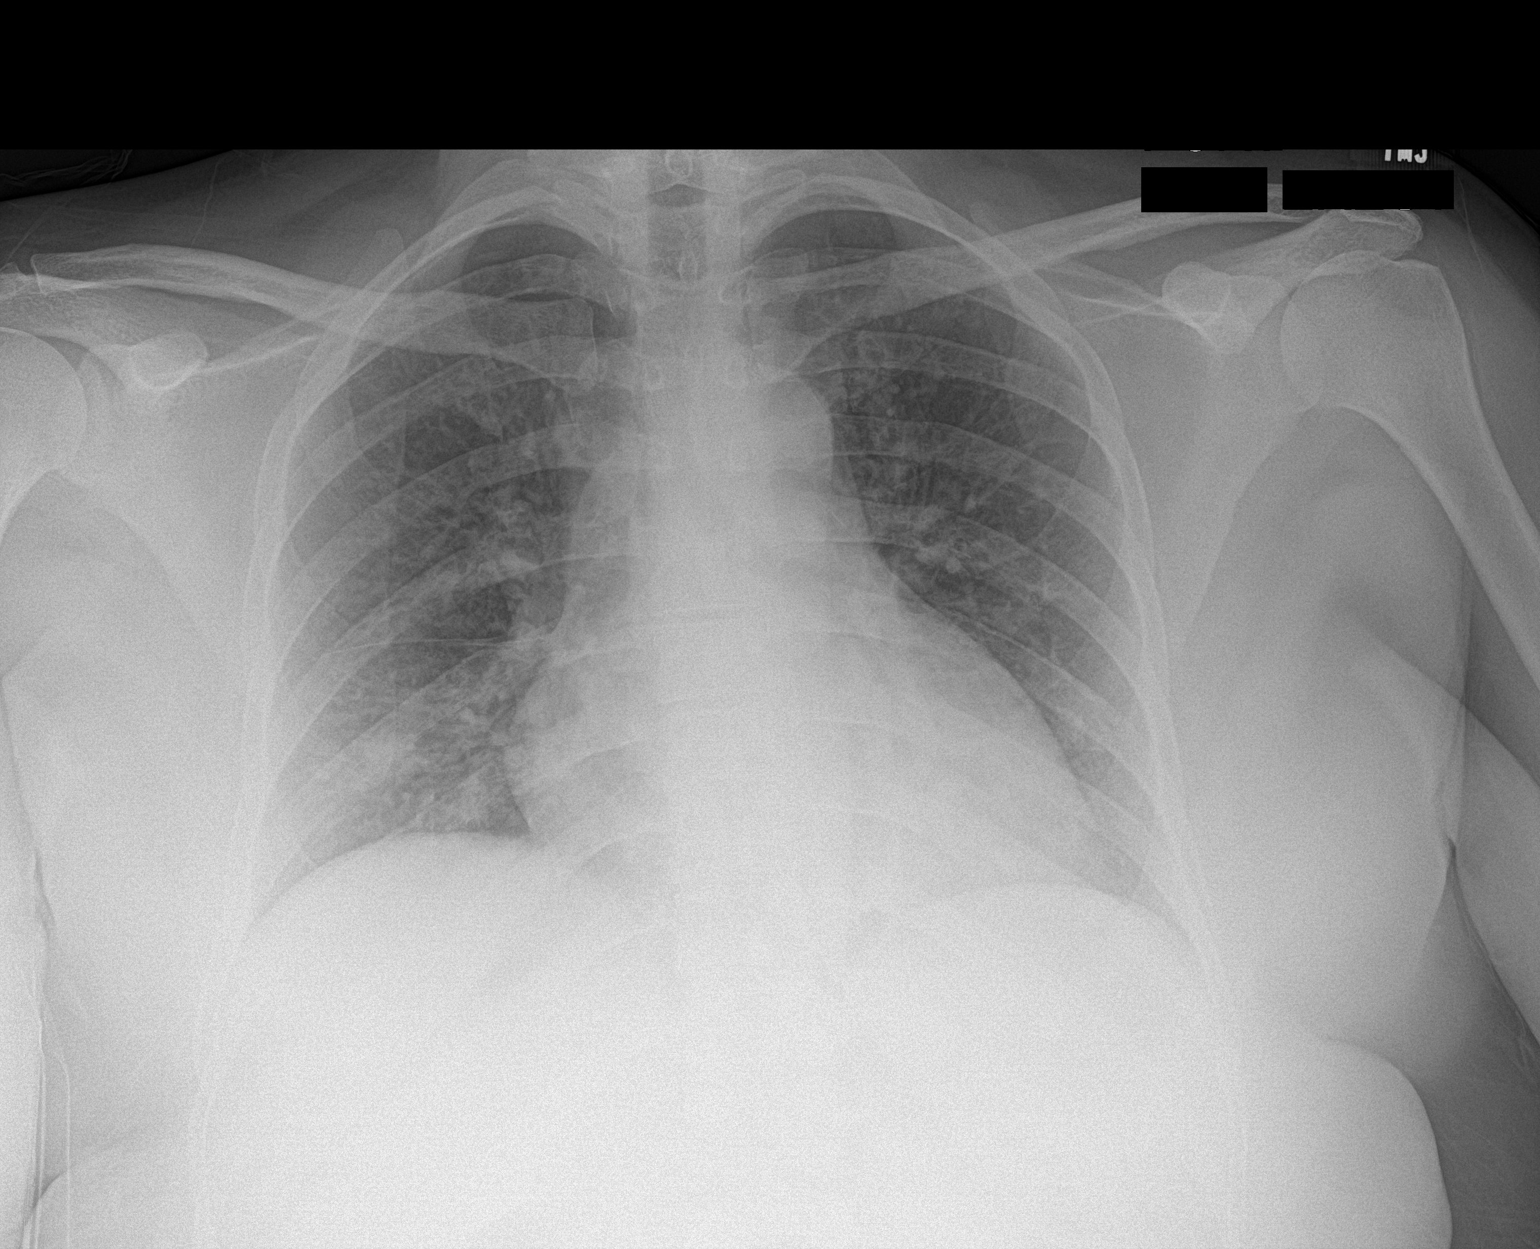

[1 of 1 positions shown; findings below may reference images not displayed]

FINDINGS: Borderline enlarged cardiac silhouette and mediastinal contours,
potentially accentuated due to hypoventilation and AP projection.
Known scattered bilateral pulmonary nodules are not well
demonstrated on this hypoventilated AP portable examination though
there is suggestion of the known approximately 1.5 cm right middle
lobe pulmonary nodule. Minimal bibasilar opacities favored to
represent atelectasis. No pleural effusion pneumothorax. No evidence
of edema. No acute osseus abnormalities.
IMPRESSION: No evidence of complication following CT-guided left lower lobe
pulmonary nodule biopsy. Specifically, no pneumothorax.

## 2016-06-12 MED ORDER — LIDOCAINE-EPINEPHRINE 1 %-1:100000 IJ SOLN
INTRAMUSCULAR | Status: AC
  Filled 2016-06-12: qty 1

## 2016-06-12 MED ORDER — FENTANYL CITRATE (PF) 100 MCG/2ML IJ SOLN
INTRAMUSCULAR | Status: AC
  Filled 2016-06-12: qty 4

## 2016-06-12 MED ORDER — SODIUM CHLORIDE 0.9 % IV SOLN
INTRAVENOUS | Status: AC | PRN
Start: 2016-06-12 — End: 2016-06-12
  Administered 2016-06-12: 10 mL/h via INTRAVENOUS

## 2016-06-12 MED ORDER — SODIUM CHLORIDE 0.9 % IV SOLN: INTRAVENOUS | Status: DC

## 2016-06-12 MED ORDER — MIDAZOLAM HCL 2 MG/2ML IJ SOLN
INTRAMUSCULAR | Status: AC | PRN
  Administered 2016-06-12 (×2): 0.5 mg via INTRAVENOUS

## 2016-06-12 MED ORDER — MIDAZOLAM HCL 2 MG/2ML IJ SOLN
INTRAMUSCULAR | Status: AC
  Filled 2016-06-12: qty 4

## 2016-06-12 MED ORDER — FENTANYL CITRATE (PF) 100 MCG/2ML IJ SOLN
INTRAMUSCULAR | Status: AC | PRN
  Administered 2016-06-12 (×2): 25 ug via INTRAVENOUS
  Administered 2016-06-12: 50 ug via INTRAVENOUS

## 2016-06-12 NOTE — Discharge Instructions (Signed)

## 2016-06-12 NOTE — H&P (Signed)
Chief Complaint: Patient was seen in consultation today for lung mass biopsy at the request of [place]  Referring Physician(s): [place]  Supervising Physician: Sandi Mariscal  Patient Status: Outpatient  History of Present Illness: Candace Graham is a 45 y.o. female   Presents to PMD regarding LLQ abdominal pain Work up includes CT scan revealing Pulmonary nodules and left renal lesion  CT 8/4: IMPRESSION: 1. Numerous pulmonary nodules scattered throughout both lungs, largest located in the right middle lobe measuring 1.8 x 1.5 cm. These are highly suspicious for metastatic disease. Recommend biopsy for definitive characterization. 2. Left renal mass, better demonstrated on the contrast-enhanced CT abdomen of 05/15/2016. As discussed on the earlier report, consider renal MRI for further characterization.  MRI 8/11: IMPRESSION: 1. 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 2. Bibasilar pulmonary nodules. It would be unusual for renal cell carcinoma to metastasize directly to the lung with a lesion of this size and without other findings such as retroperitoneal adenopathy or tumor thrombus. Accordingly I am uncertain if the pulmonary nodules represent metastatic disease from the kidney or not. 3. Lesion in the right L1 vertebral body has CT characteristics mildly favoring hemangioma, but nonspecific MRI characteristics. This lesion diffusely enhances, and the overall imaging appearance leads me without total confidence that this is a benign hemangioma. Consider lumbar spine MRI with and without contrast for further Characterization.  Nonsmoker Takes no prescribed medication Scheduled now for lung mass biopsy  Past Medical History:  Diagnosis Date  . Abnormal uterine bleeding    Heavy menstrual cycles  . Anemia   . Vaginal delivery 1991, 1999    Past  Surgical History:  Procedure Laterality Date  . DILATATION & CURRETTAGE/HYSTEROSCOPY WITH RESECTOCOPE N/A 04/25/2015   Procedure: DILATATION & CURETTAGE/HYSTEROSCOPY WITH RESECTOCOPE;  Surgeon: Megan Salon, MD;  Location: [facility];  Service: Gynecology;  Laterality: N/A;  . NO PAST SURGERIES    . uterine polyp removal      Allergies: Review of patient's allergies indicates no known allergies.  Medications: Prior to Admission medications   Not on File     Family History  Problem Relation Age of Onset  . Pancreatic cancer Mother   . Hypertension Mother   . Stroke Mother   . Thyroid disease Mother   . Hypertension Brother   . Hypertension Brother   . Heart disease Father   . Arthritis Father   . Alcohol abuse Maternal Grandmother   . Esophageal cancer Maternal Grandmother   . Drug abuse Maternal Grandfather     Social History   Social History  . Marital status: Single    Spouse name: N/A  . Number of children: 2  . Years of education: 8   Social History Main Topics  . Smoking status: Never Smoker  . Smokeless tobacco: Never Used  . Alcohol use No  . Drug use: No  . Sexual activity: Yes    Partners: Male    Birth control/ protection: None   Other Topics Concern  . None   Social History Narrative   Fun: Travel, working with sons.   Denies religious beliefs effecting health care.    Denies abuse and feels safe at home.      Review of Systems: A 12 point ROS discussed and pertinent positives are indicated in the HPI above.  All other systems are negative.  Review of Systems  Constitutional: Positive for appetite change. Negative for activity change, fatigue and fever.  Respiratory: Negative for cough, chest tightness and shortness of breath.   Cardiovascular: Negative for chest pain.  Gastrointestinal: Positive for abdominal pain. Negative for nausea.  Musculoskeletal: Negative for back pain.  Neurological: Negative for weakness.  Psychiatric/Behavioral:  Negative for behavioral problems and confusion.    Vital Signs: BP 125/84   Pulse 65   Temp 98 F (36.7 C) (Oral)   Resp 18   Ht 5' 7.5" (1.715 m)   Wt 227 lb (103 kg)   LMP 08/15/2014   SpO2 100%   BMI 35.03 kg/m   Physical Exam  Constitutional: She is oriented to person, place, and time.  Cardiovascular: Normal rate, regular rhythm and normal heart sounds.   Pulmonary/Chest: Effort normal and breath sounds normal. She has no wheezes.  Abdominal: Soft. Bowel sounds are normal. There is no tenderness.  Musculoskeletal: Normal range of motion.  Neurological: She is alert and oriented to person, place, and time.  Skin: Skin is warm and dry.  Psychiatric: She has a normal mood and affect. Her behavior is normal. Judgment and thought content normal.    Mallampati Score:  MD Evaluation Airway: WNL Heart: WNL Abdomen: WNL Chest/ Lungs: WNL ASA  Classification: 2 Mallampati/Airway Score: Two  Imaging: Ct Chest Wo Contrast  Result Date: 05/18/2016 CLINICAL DATA:  Follow-up lung nodules seen on CT abdomen/pelvis of 05/15/2016. EXAM: CT CHEST WITHOUT CONTRAST TECHNIQUE: Multidetector CT imaging of the chest was performed following the standard protocol without IV contrast. COMPARISON:  CT abdomen dated 05/15/2016 FINDINGS: Cardiovascular: Heart size is normal. No pericardial effusion. Thoracic aorta is normal in caliber. Mediastinum/Nodes: No mass or enlarged lymph nodes seen in the mediastinum or perihilar regions. Lungs/Pleura: Numerous pulmonary nodules scattered throughout both lungs, largest located in the right middle lobe measuring 1.8 x 1.5 cm (series 4, image 64). Largest on the left is in the upper lobe measuring 1 x 1 cm (series 4, image 55). No evidence of pneumonia.  No pleural effusion or pneumothorax. Upper Abdomen: Again noted is the mass exophytic to the lateral cortex of the left kidney, better demonstrated on the earlier contrast enhanced abdomen CT of 05/15/2016.  Limited images of the upper abdomen are otherwise unremarkable. Musculoskeletal: No acute or suspicious osseous finding. Superficial soft tissues are unremarkable. IMPRESSION: 1. Numerous pulmonary nodules scattered throughout both lungs, largest located in the right middle lobe measuring 1.8 x 1.5 cm. These are highly suspicious for metastatic disease. Recommend biopsy for definitive characterization. 2. Left renal mass, better demonstrated on the contrast-enhanced CT abdomen of 05/15/2016. As discussed on the earlier report, consider renal MRI for further characterization. Electronically Signed   By: Franki Cabot M.D.   On: 05/18/2016 17:25   Mr Abdomen W Wo Contrast  Result Date: 05/25/2016 CLINICAL DATA:  Left lower abdominal pain ; recent CT showed a the complex left renal lesion and bilateral pulmonary nodules. EXAM: MRI ABDOMEN WITHOUT AND WITH CONTRAST TECHNIQUE: Multiplanar multisequence MR imaging of the abdomen was performed both before and after the administration of intravenous contrast. CONTRAST:  38mL MULTIHANCE GADOBENATE DIMEGLUMINE 529 MG/ML IV SOLN COMPARISON:  05/15/2016 FINDINGS: Lower chest:  Bibasilar pulmonary nodules. Hepatobiliary: Unremarkable Pancreas: Unremarkable Spleen: Unremarkable Adrenals/Urinary Tract: Adrenal glands normal. 2.5 by 2.3 by 2.5 cm left renal mass observed with generally faint enhancement but with more prominently enhancing central nodular elements, for example on images 40 through 47 of series 11205. The appearance is compatible  with renal cell carcinoma. Most of the lesion has low T2 signal characteristics although the more enhancing components have high T2 signal characteristics. Although not entirely specific given the heterogeneity, this might support a papillary cell type. Right kidney unremarkable. Stomach/Bowel: Unremarkable Vascular/Lymphatic: No pathologic retroperitoneal adenopathy. No aneurysm identified. No tumor thrombus in the left renal vein or  IVC. Other: No supplemental non-categorized findings. Musculoskeletal: There is diffuse enhancement to the right side of the L1 vertebral body. On CT images there is some coarsening of trabecula which could go along with a hemangioma, but I do not consider the MRI findings highly specific for hemangioma. There is only equivocal increased T1 signal in this region on image 56/8. IMPRESSION: 1. 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 2. Bibasilar pulmonary nodules. It would be unusual for renal cell carcinoma to metastasize directly to the lung with a lesion of this size and without other findings such as retroperitoneal adenopathy or tumor thrombus. Accordingly I am uncertain if the pulmonary nodules represent metastatic disease from the kidney or not. 3. Lesion in the right L1 vertebral body has CT characteristics mildly favoring hemangioma, but nonspecific MRI characteristics. This lesion diffusely enhances, and the overall imaging appearance leads me without total confidence that this is a benign hemangioma. Consider lumbar spine MRI with and without contrast for further characterization. Electronically Signed   By: Van Clines M.D.   On: 05/25/2016 17:50   Ct Abdomen Pelvis W Contrast  Result Date: 05/15/2016 CLINICAL DATA:  Left lower quadrant abdominal pain EXAM: CT ABDOMEN AND PELVIS WITH CONTRAST TECHNIQUE: Multidetector CT imaging of the abdomen and pelvis was performed using the standard protocol following bolus administration of intravenous contrast. CONTRAST:  125mL ISOVUE-300 IOPAMIDOL (ISOVUE-300) INJECTION 61% COMPARISON:  None. FINDINGS: Lower chest and abdominal wall: Innumerable pulmonary nodules in the visualized lower lungs with random distribution where seen, measuring up to 17 mm in the right middle lobe. These are primarily concerning for metastatic disease. Atypical infection is a  differential consideration Hepatobiliary: No focal liver abnormality.No evidence of biliary obstruction or stone. Pancreas: Unremarkable. Spleen: Unremarkable. Adrenals/Urinary Tract: Negative adrenals. No hydronephrosis or stone. 25 mm soft tissue density mass in the left kidney without evidence of de enhancement on delayed phase. Unremarkable bladder. Stomach/Bowel: No obstruction or inflammation. No evidence of mass. No appendicitis. Reproductive:Numerous hypoenhancing intramural masses consistent fibroids. These are numerous and throughout the uterus, causing endometrial distortion. The largest measures 27 mm in the posterior body. Vascular/Lymphatic: No acute vascular abnormality. No mass or adenopathy. Other: No ascites or pneumoperitoneum. Musculoskeletal: L1 hemangioma.  No acute or aggressive finding. These results will be called to the ordering clinician or representative by the Radiologist Assistant, and communication documented in the PACS or zVision Dashboard. IMPRESSION: 1. Innumerable pulmonary nodules in the lower lungs primarily concerning for metastatic disease. Recommend chest CT. 2. 25 mm solid mass or complex cyst in left kidney. Even if a renal cell carcinoma, would not expect widespread pulmonary metastases at this size. Renal MRI may be required after chest workup. 3. No explanation for left lower quadrant pain. 4. Fibroid uterus. Electronically Signed   By: Monte Fantasia M.D.   On: 05/15/2016 14:56    Labs:  CBC:  Recent Labs  06/24/15 1925 03/20/16 1556 06/12/16 0630  WBC 6.1 5.6 6.2  HGB 11.4* 12.2 12.3  HCT 36.3 37.7 39.0  PLT 321 327 316  COAGS:  Recent Labs  06/12/16 0630  INR 1.01  APTT 35    BMP:  Recent Labs  06/24/15 1925 03/20/16 1556  NA 139 142  K 4.0 4.1  CL 109 104  CO2 24 27  GLUCOSE 96 87  BUN 15 8  CALCIUM 9.1 9.3  CREATININE 0.74 0.64  GFRNONAA >60  --   GFRAA >60  --     LIVER FUNCTION TESTS:  Recent Labs  03/20/16 1556    BILITOT 0.4  AST 18  ALT 23  ALKPHOS 94  PROT 7.2  ALBUMIN 4.0    TUMOR MARKERS: No results for input(s): AFPTM, CEA, CA199, CHROMGRNA in the last 8760 hours.  Assessment and Plan:  Bilateral pulmonary nodules Left renal lesion Scheduled for lung mass biopsy Risks and Benefits discussed with the patient including, but not limited to bleeding, hemoptysis, respiratory failure requiring intubation, infection, pneumothorax requiring chest tube placement, stroke from air embolism or even death. All of the patient's questions were answered, patient is agreeable to proceed. Consent signed and in chart.   Thank you for this interesting consult.  I greatly enjoyed meeting Candace Graham and look forward to participating in their care.  A copy of this report was sent to the requesting provider on this date.  Electronically Signed: Monia Sabal A 06/12/2016, 7:25 AM   I spent a total of  30 Minutes   in face to face in clinical consultation, greater than 50% of which was counseling/coordinating care for lung mass biopy

## 2016-06-12 NOTE — Procedures (Signed)
Technically successful CT guided biopsy of indeterminate LLL pulmonary nodule. EBL: None.  No immediate post procedural complications.   Ronny Bacon, MD Pager #: [phone]

## 2016-06-13 ENCOUNTER — Emergency Department (HOSPITAL_COMMUNITY): Payer: BLUE CROSS/BLUE SHIELD

## 2016-06-13 ENCOUNTER — Emergency Department (HOSPITAL_COMMUNITY)
Admission: EM | Admit: 2016-06-13 | Discharge: 2016-06-13 | Disposition: A | Discharge status: Home / Self Care | Payer: BLUE CROSS/BLUE SHIELD | Attending: Emergency Medicine | Admitting: Emergency Medicine

## 2016-06-13 ENCOUNTER — Encounter (HOSPITAL_COMMUNITY): Payer: Self-pay | Admitting: *Deleted

## 2016-06-13 DIAGNOSIS — R222 Localized swelling, mass and lump, trunk: Secondary | ICD-10-CM | POA: Diagnosis not present

## 2016-06-13 DIAGNOSIS — R918 Other nonspecific abnormal finding of lung field: Secondary | ICD-10-CM | POA: Diagnosis not present

## 2016-06-13 DIAGNOSIS — Z9889 Other specified postprocedural states: Secondary | ICD-10-CM | POA: Diagnosis not present

## 2016-06-13 MED ORDER — NAPROXEN 500 MG PO TABS: 500.0000 mg | ORAL_TABLET | Freq: Two times a day (BID) | ORAL | 0 refills | Status: DC

## 2016-06-13 MED ORDER — NAPROXEN 500 MG PO TABS
500.0000 mg | ORAL_TABLET | Freq: Once | ORAL | Status: AC
  Administered 2016-06-13: 500 mg via ORAL
  Filled 2016-06-13: qty 1

## 2016-06-13 NOTE — Discharge Instructions (Signed)
Take Naproxen or ibuprofen for pain and swelling. You may apply ice to areas of swelling to try and limit pain. Follow up with your primary care doctor and specialists as instructed. Return for any new or concerning symptoms.

## 2016-06-13 NOTE — ED Triage Notes (Signed)
Pt reports having a lung biopsy this morning at 9am and was discharged at 11:30am.  Pt has been at home in pain.  Pt reports that she has a lump on the front of her chest.  Area is not tender to palpation and pt does not report SOB.  Pt a/o x 4 and ambulatory. Pt rates pain at her biopsy site a 5 out of 10.  No signs of infection noted at site.

## 2016-06-13 NOTE — ED Provider Notes (Signed)
[facility] DEPT Provider Note   CSN: [id number] Arrival date & time: 06/13/16  0238     History   Chief Complaint Chief Complaint  Patient presents with  . Chest Lump    HPI Candace Graham is a 45 y.o. female.  45 year old female with a history of uterine fibroids and anemia presents to the emergency department 1 day s/p lung biopsy by IR for c/o chest swelling. Patient states that she began to notice some swelling to her anterior chest this evening. This is more left sided. Site of biopsy is in the left posterior lower lung field. Patient c/o some pain with deep breathing, but this is unchanged since the procedure. No medications taken PTA for pain. No fevers or syncope PTA. Patient states that the area of swelling is nontender. Biopsy was performed to r/o metastasis as recent CT abdomen/pelvis showed evidence of a concerning renal mass.   The history is provided by the patient. No language interpreter was used.    Past Medical History:  Diagnosis Date  . Abnormal uterine bleeding    Heavy menstrual cycles  . Anemia   . Fibroid tumor   . Vaginal delivery 1991, 1999    Patient Active Problem List   Diagnosis Date Noted  . Back pain 04/28/2015  . Obesity 04/28/2015  . Prediabetes 04/28/2015  . Abnormal perimenopausal bleeding 04/19/2015  . Anemia, iron deficiency 02/16/2013  . Fibroids 02/16/2013    Past Surgical History:  Procedure Laterality Date  . DILATATION & CURRETTAGE/HYSTEROSCOPY WITH RESECTOCOPE N/A 04/25/2015   Procedure: DILATATION & CURETTAGE/HYSTEROSCOPY WITH RESECTOCOPE;  Surgeon: Megan Salon, MD;  Location: [facility];  Service: Gynecology;  Laterality: N/A;  . NO PAST SURGERIES    . uterine polyp removal      OB History    Gravida Para Term Preterm AB Living   4 2 2   2 2    SAB TAB Ectopic Multiple Live Births     2     2       Home Medications    Prior to Admission medications   Medication Sig Start Date End Date Taking? Authorizing  Provider  naproxen (NAPROSYN) 500 MG tablet Take 1 tablet (500 mg total) by mouth 2 (two) times daily. Take for pain and/or inflammation 06/13/16   Antonietta Breach, PA-C    Family History Family History  Problem Relation Age of Onset  . Pancreatic cancer Mother   . Hypertension Mother   . Stroke Mother   . Thyroid disease Mother   . Hypertension Brother   . Hypertension Brother   . Heart disease Father   . Arthritis Father   . Alcohol abuse Maternal Grandmother   . Esophageal cancer Maternal Grandmother   . Drug abuse Maternal Grandfather     Social History Social History  Substance Use Topics  . Smoking status: Never Smoker  . Smokeless tobacco: Never Used  . Alcohol use No     Allergies   Review of patient's allergies indicates no known allergies.   Review of Systems Review of Systems  Constitutional: Negative for fever.  Respiratory: Negative for shortness of breath.   Cardiovascular:       Swelling of chest wall  Neurological: Negative for syncope.  Ten systems reviewed and are negative for acute change, except as noted in the HPI.     Physical Exam Updated Vital Signs BP 133/95 (BP Location: Right Arm)   Pulse 75   Temp 98.9 F (37.2 C) (  Oral)   Resp 22   Ht 5' 7.5" (1.715 m)   Wt 103 kg   LMP 08/15/2014   SpO2 99%   BMI 35.03 kg/m   Physical Exam  Constitutional: She is oriented to person, place, and time. She appears well-developed and well-nourished. No distress.  Nontoxic appearing and in NAD  HENT:  Head: Normocephalic and atraumatic.  Eyes: Conjunctivae and EOM are normal. No scleral icterus.  Neck: Normal range of motion.  No JVD  Cardiovascular: Normal rate, regular rhythm and intact distal pulses.   Pulmonary/Chest: Effort normal. No respiratory distress. She has no wheezes. She has no rales.     She exhibits swelling. She exhibits no tenderness, no crepitus and no retraction.    Clear sounds in all lung fields. Chest expansion  symmetric. Mild tenderness to palpation surrounding biopsy site. There is evidence of mild swelling to the anterior chest wall; superior sternal region. No crepitus.  Musculoskeletal: Normal range of motion.  Neurological: She is alert and oriented to person, place, and time.  GCS 15. Patient moving all extremities  Skin: Skin is warm and dry. No rash noted. She is not diaphoretic. No erythema. No pallor.  Psychiatric: She has a normal mood and affect. Her behavior is normal.  Nursing note and vitals reviewed.    ED Treatments / Results  Labs (all labs ordered are listed, but only abnormal results are displayed) Labs Reviewed - No data to display  EKG  EKG Interpretation None       Radiology Dg Chest 2 View  Result Date: 06/13/2016 CLINICAL DATA:  Worsening pain since lung biopsy yesterday. EXAM: CHEST  2 VIEW COMPARISON:  06/12/2016, 05/18/2016 FINDINGS: Multiple pulmonary nodules are again evident. No pneumothorax. No evidence of significant hemorrhage. No effusion. Hilar, mediastinal and cardiac contours are normal unchanged. Pulmonary vasculature is normal. IMPRESSION: Multiple pulmonary nodules without significant interval change. No evidence of significant complication from the recent biopsy. No acute findings. Electronically Signed   By: Andreas Newport M.D.   On: 06/13/2016 04:21   Ct Biopsy  Result Date: 06/12/2016 INDICATION: Left-sided renal mass worrisome for renal cell carcinoma, now with multiple pulmonary nodules worrisome for metastatic disease. Please perform CT-guided biopsy for tissue diagnostic purposes. EXAM: CT-GUIDED LEFT LOWER LOBE PULMONARY NODULE BIOPSY COMPARISON:  Chest CT - 05/18/2016; CT the abdomen pelvis - 05/15/2016; abdominal MRI -05/25/2026 MEDICATIONS: None. ANESTHESIA/SEDATION: Fentanyl 100 mcg IV; Versed 1 mg IV Sedation time: 20 minutes; The patient was continuously monitored during the procedure by the interventional radiology nurse under my  direct supervision. CONTRAST:  None COMPLICATIONS: None immediate. PROCEDURE: Informed consent was obtained from the patient following an explanation of the procedure, risks, benefits and alternatives. The patient understands,agrees and consents for the procedure. All questions were addressed. A time out was performed prior to the initiation of the procedure. The patient was positioned left lateral decubitus on the CT table and a limited chest CT was performed for procedural planning demonstrating multiple indeterminate pulmonary nodules. An approximately 0.9 x 0.7 cm nodule within the subpleural basilar aspect of the left lower lobe (is 26, series 4) was targeted for biopsy given lesion location. (Note, known dominant approximately 1.8 cm right middle lobe pulmonary nodule was not targeted secondary to having traverse approximately 12 cm of a combination of breast tissue and lung parenchyma prior to sampling the nodule.) The operative site was prepped and draped in the usual sterile fashion. Under sterile conditions and local anesthesia, a 17 gauge coaxial  needle was advanced into the peripheral aspect of the nodule. Positioning was confirmed with intermittent CT fluoroscopy and followed by the acquisition of 3 core needle biopsies with an 18 gauge core needle biopsy device. The coaxial needle was removed following deployment of a [employer] plug and superficial hemostasis was achieved with manual compression. Limited post procedural chest CT was negative for pneumothorax or additional complication. A dressing was placed. The patient tolerated the procedure well without immediate postprocedural complication. The patient was escorted to have an upright chest radiograph. IMPRESSION: Technically successful CT guided core needle core biopsy of approximately 0.9 cm left lower lobe pulmonary nodule. Electronically Signed   By: Sandi Mariscal M.D.   On: 06/12/2016 10:47   Dg Chest Port 1 View  Result Date:  06/12/2016 CLINICAL DATA:  Post left lower lobe pulmonary nodule biopsy. EXAM: PORTABLE CHEST 1 VIEW COMPARISON:  Chest CT - 05/18/2016; CT-guided left lower lobe pulmonary nodule biopsy - earlier same day FINDINGS: Borderline enlarged cardiac silhouette and mediastinal contours, potentially accentuated due to hypoventilation and AP projection. Known scattered bilateral pulmonary nodules are not well demonstrated on this hypoventilated AP portable examination though there is suggestion of the known approximately 1.5 cm right middle lobe pulmonary nodule. Minimal bibasilar opacities favored to represent atelectasis. No pleural effusion pneumothorax. No evidence of edema. No acute osseus abnormalities. IMPRESSION: No evidence of complication following CT-guided left lower lobe pulmonary nodule biopsy. Specifically, no pneumothorax. Electronically Signed   By: Sandi Mariscal M.D.   On: 06/12/2016 10:27    Procedures Procedures (including critical care time)  Medications Ordered in ED Medications  naproxen (NAPROSYN) tablet 500 mg (500 mg Oral Given 06/13/16 0448)     Initial Impression / Assessment and Plan / ED Course  I have reviewed the triage vital signs and the nursing notes.  Pertinent labs & imaging results that were available during my care of the patient were reviewed by me and considered in my medical decision making (see chart for details).  Clinical Course    45 year old female presents to the emergency department over concern for anterior chest wall swelling. She is one day post CT-guided lung biopsy. She reports some chest pain which is worse with inspiration, but denies any change in this discomfort since her procedure. She has had no shortness of breath and is without hypoxia today. No fever.  Chest x-ray obtained which shows no complications following biopsy. No free air or pneumothorax. Patient given naproxen for pain control. I do not believe further emergent workup is indicated.  The patient has been instructed to follow-up with her primary care doctor regarding her ED visit today. Return precautions discussed and provided. Patient discharged in satisfactory condition with no unaddressed concerns.   Final Clinical Impressions(s) / ED Diagnoses   Final diagnoses:  History of lung biopsy  Soft tissue swelling of chest wall    New Prescriptions New Prescriptions   NAPROXEN (NAPROSYN) 500 MG TABLET    Take 1 tablet (500 mg total) by mouth 2 (two) times daily. Take for pain and/or inflammation     Antonietta Breach, PA-C 06/13/16 [id number]    April Palumbo, MD 06/13/16 [phone]

## 2016-06-15 ENCOUNTER — Other Ambulatory Visit: Payer: Self-pay | Admitting: Urology

## 2016-06-15 DIAGNOSIS — C642 Malignant neoplasm of left kidney, except renal pelvis: Secondary | ICD-10-CM

## 2016-06-20 ENCOUNTER — Encounter (HOSPITAL_COMMUNITY)
Admission: RE | Admit: 2016-06-20 | Discharge: 2016-06-20 | Disposition: A | Discharge status: Home / Self Care | Payer: BLUE CROSS/BLUE SHIELD | Source: Ambulatory Visit | Attending: Urology | Admitting: Urology

## 2016-06-20 DIAGNOSIS — C642 Malignant neoplasm of left kidney, except renal pelvis: Secondary | ICD-10-CM | POA: Diagnosis not present

## 2016-06-20 LAB — GLUCOSE, CAPILLARY: Glucose-Capillary: 100 mg/dL — ABNORMAL HIGH (ref 65–99)

## 2016-06-20 MED ORDER — FLUDEOXYGLUCOSE F - 18 (FDG) INJECTION
11.2800 | Freq: Once | INTRAVENOUS | Status: AC | PRN
  Administered 2016-06-20: 11.28 via INTRAVENOUS

## 2016-06-25 ENCOUNTER — Institutional Professional Consult (permissible substitution): Payer: BLUE CROSS/BLUE SHIELD | Admitting: Internal Medicine

## 2016-06-27 ENCOUNTER — Telehealth: Payer: Self-pay | Admitting: Internal Medicine

## 2016-06-27 ENCOUNTER — Encounter: Payer: Self-pay | Admitting: Internal Medicine

## 2016-06-27 ENCOUNTER — Ambulatory Visit (INDEPENDENT_AMBULATORY_CARE_PROVIDER_SITE_OTHER): Payer: BLUE CROSS/BLUE SHIELD | Admitting: Internal Medicine

## 2016-06-27 VITALS — BP 138/96 | HR 83 | Ht 67.5 in | Wt 226.0 lb

## 2016-06-27 DIAGNOSIS — R918 Other nonspecific abnormal finding of lung field: Secondary | ICD-10-CM | POA: Diagnosis not present

## 2016-06-27 DIAGNOSIS — N2889 Other specified disorders of kidney and ureter: Secondary | ICD-10-CM

## 2016-06-27 DIAGNOSIS — C649 Malignant neoplasm of unspecified kidney, except renal pelvis: Secondary | ICD-10-CM | POA: Insufficient documentation

## 2016-06-27 NOTE — Patient Instructions (Signed)
ICD-9-CM ICD-10-CM   1. Multiple lung nodules 793.19 R91.8   2. Left renal mass 593.9 N28.89     Left lower pulmonary nodule biopsy 06/12/16 is low prob for cancer but technically non-diagnostic  Will reach out to Dr Tresa Moore who you see tomorrow  -  to decide his threshold for needing to know what the larger Rt sided lung nodule is  - if he feels that needs biopsy will refer to Dr Roxan Hockey our thoracic surgeon for consideration of navigational bronchoscopy biopsy  Please await my call by end of week

## 2016-06-27 NOTE — Progress Notes (Signed)
Subjective:    Patient ID: Candace Graham, female    DOB: Jul 02, 1971, 45 y.o.   MRN: [id number]  PCP Mauricio Po, FNP   HPI  IOV 06/27/2016  Chief Complaint  Patient presents with  . Pulmonary Consult    Pt is self referral for lung nodules. Pt is wanting follow up on lung nodules. Pt had  Pt denies SOB, cough.  Pt had lung biopsy in late August     45 year old [place] American female reports that a year ago she had endometrial polyp removed. She continued to have some back pain and saw Dr. Verdia Kuba gastroenterlogist. This resulted in a CT abdomen pelvis 05/15/2016 that showed a 2.5 cm well encapsulated left renal mass. This prompted a urology referral. The CT abdomen lung cut that showed pulmonary nodules which was confirmed on a CT chest 05/18/2016. The largest was a left lower lobe nodule and a even larger right middle lobe nodule 1.81.5 cm. She then saw urology Dr. Diona Fanti was concerned about the possibility of renal cell cancer with pulmonary metastasis although in talking to the patient and review of the radiology reports it was felt that the nodules was low-intermediate probability for pulmonary metastasis because the size of the renal mass is really smaller 2.5 cm. 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 Was again felt that the nodules did not fit in with a pattern of pulmonary metastasis. Then on 06/20/2016 she had a PET scan the pulmonary nodules were below the limit of detection except the right middle lobe lung nodule which was of low uptake. The left renal mass is of high uptake and suggestive of renal cell cancer. Through all this on 06/12/2016 she underwent CT-guided transthoracic needle biopsy that was nondiagnostic showing sclerosis and dense fibrosis and scant-benign lung parenchyma.  Patient is here for further consideration. She is extremely worried  about undergoing repeat biopsy of the lung nodule. She is worried about the complications. She says she found the CT-guided transthoracic needle biopsy painful and does not want to undergo the same procedure again. She believes that these nodules represent an alternative diagnosis such as sarcoidosis especially given her age and African-American ethnicity.  After she left as per Dr. Alexis Frock urology she is going to see tomorrow. He will is the best approach is to do a partial nephrectomy and decide if this mass is renal cell cancer. And even in the setting of renal metastasis and nephrectomy would help improve prognosis. He also believes that the pretest probably ready for the lung nodules being pulmonary metastasis is low.     has a past medical history of Abnormal uterine bleeding; Anemia; Fibroid tumor; and Vaginal delivery (1991, 1999).   reports that she has never smoked. She has never used smokeless tobacco.  Past Surgical History:  Procedure Laterality Date  . DILATATION & CURRETTAGE/HYSTEROSCOPY WITH RESECTOCOPE N/A 04/25/2015   Procedure: DILATATION & CURETTAGE/HYSTEROSCOPY WITH RESECTOCOPE;  Surgeon: Megan Salon, MD;  Location: [facility];  Service: Gynecology;  Laterality: N/A;  . LUNG BIOPSY    . uterine polyp removal      No Known Allergies  Immunization History  Administered Date(s) Administered  . Tdap 02/10/2014    Family History  Problem Relation Age of Onset  . Pancreatic cancer Mother   . Hypertension Mother   . Stroke Mother   . Thyroid disease Mother   .  Hypertension Brother   . Hypertension Brother   . Heart disease Father   . Arthritis Father   . Alcohol abuse Maternal Grandmother   . Esophageal cancer Maternal Grandmother   . Drug abuse Maternal Grandfather     No current outpatient prescriptions on file.    Review of Systems  Constitutional: Negative for fever and unexpected weight change.  HENT: Negative for congestion, dental problem, ear  pain, nosebleeds, postnasal drip, rhinorrhea, sinus pressure, sneezing, sore throat and trouble swallowing.   Eyes: Negative for redness and itching.  Respiratory: Negative for cough, chest tightness, shortness of breath and wheezing.   Cardiovascular: Negative for palpitations and leg swelling.  Gastrointestinal: Negative for nausea and vomiting.  Genitourinary: Negative for dysuria.  Musculoskeletal: Negative for joint swelling.  Skin: Negative for rash.  Neurological: Negative for headaches.  Hematological: Does not bruise/bleed easily.  Psychiatric/Behavioral: Negative for dysphoric mood. The patient is not nervous/anxious.        Objective:   Physical Exam  Constitutional: She is oriented to person, place, and time. She appears well-developed and well-nourished. No distress.  HENT:  Head: Normocephalic and atraumatic.  Right Ear: External ear normal.  Left Ear: External ear normal.  Mouth/Throat: Oropharynx is clear and moist. No oropharyngeal exudate.  Eyes: Conjunctivae and EOM are normal. Pupils are equal, round, and reactive to light. Right eye exhibits no discharge. Left eye exhibits no discharge. No scleral icterus.  Neck: Normal range of motion. Neck supple. No JVD present. No tracheal deviation present. No thyromegaly present.  Cardiovascular: Normal rate, regular rhythm, normal heart sounds and intact distal pulses.  Exam reveals no gallop and no friction rub.   No murmur heard. Pulmonary/Chest: Effort normal and breath sounds normal. No respiratory distress. She has no wheezes. She has no rales. She exhibits no tenderness.  Abdominal: Soft. Bowel sounds are normal. She exhibits no distension and no mass. There is no tenderness. There is no rebound and no guarding.  Musculoskeletal: Normal range of motion. She exhibits no edema or tenderness.  Lymphadenopathy:    She has no cervical adenopathy.  Neurological: She is alert and oriented to person, place, and time. She has  normal reflexes. No cranial nerve deficit. She exhibits normal muscle tone. Coordination normal.  Skin: Skin is warm and dry. No rash noted. She is not diaphoretic. No erythema. No pallor.  Psychiatric: She has a normal mood and affect. Her behavior is normal. Judgment and thought content normal.  Vitals reviewed.   Vitals:   06/27/16 1533  BP: (!) 138/96  Pulse: 83  SpO2: 97%  Weight: 226 lb (102.5 kg)  Height: 5' 7.5" (1.715 m)    Estimated body mass index is 34.87 kg/m as calculated from the following:   Height as of this encounter: 5' 7.5" (1.715 m).   Weight as of this encounter: 226 lb (102.5 kg).        Assessment & Plan:     ICD-9-CM ICD-10-CM   1. Multiple lung nodules 793.19 R91.8   2. Left renal mass 593.9 N28.89    Based on my conversation with Dr. Phebe Colla urologist best approach is to go ahead with left-sided nephrectomy partial and then decide if she needs an navigational bronchoscopy guided biopsy of the right lung nodule. The left lower lobe was nondiagnostic/low probability for cancer. I will communicate this information to the patient. I will have her come back to see me in 4-8 weeks   > 50% of this > 40  min visit spent in face to face counseling or/and coordination of care    Dr. Brand Males, M.D., [facility].C.P Pulmonary and Critical Care Medicine Staff Physician [facility] Pager: [phone], If no answer or between  15:00h - 7:00h: call [phone]  06/27/2016 5:49 PM

## 2016-06-27 NOTE — Telephone Encounter (Signed)
Let Candace Graham know that I d/w Dr Tresa Moore and we think doing partial nephrectomy now is best approach and then we can decide about the right lung nodule. So, give her fu to see me in 4-8 weeks  Thanks  Dr. Brand Males, M.D., [facility].C.P Pulmonary and Critical Care Medicine Staff Physician [facility] Pager: [phone], If no answer or between  15:00h - 7:00h: call [phone]  06/27/2016 5:52 PM

## 2016-06-28 DIAGNOSIS — D4102 Neoplasm of uncertain behavior of left kidney: Secondary | ICD-10-CM | POA: Diagnosis not present

## 2016-06-29 ENCOUNTER — Other Ambulatory Visit: Payer: Self-pay | Admitting: Urology

## 2016-06-29 NOTE — Telephone Encounter (Signed)
Called and spoke to pt. Informed her of the recs per MR. Appt scheduled for 08/21/16. Pt verbalized understanding and denied any further questions or concerns at this time.

## 2016-06-29 NOTE — Progress Notes (Signed)
Pt is being scheduled for preop appt. Please place surgical orders in epic. Thanks.

## 2016-07-17 NOTE — Patient Instructions (Signed)
Candace Graham  07/17/2016   Your procedure is scheduled on: 07/27/2016    Report to [facility] Main  Entrance take [facility]  elevators to 3rd floor to  [place] at     1045 AM.  Call this number if you have problems the morning of surgery [phone]   Remember: ONLY 1 PERSON MAY GO WITH YOU TO SHORT STAY TO GET  READY MORNING OF [place].  Clear liquid diet the day before surgery.                Magnesium Citrate- Drink by 12 noon day before surgery.      Take these medicines the morning of surgery with A SIP OF WATER:  DO NOT TAKE ANY DIABETIC MEDICATIONS DAY OF YOUR SURGERY                               You may not have any metal on your body including hair pins and              piercings  Do not wear jewelry, make-up, lotions, powders or perfumes, deodorant             Do not wear nail polish.  Do not shave  48 hours prior to surgery.             Do not bring valuables to the hospital. [place].  Contacts, dentures or bridgework may not be worn into surgery.  Leave suitcase in the car. After surgery it may be brought to your room.         Special Instructions:coughing and deep breathing exercises, leg exercises               Please read over the following fact sheets you were given: _____________________________________________________________________             [facility] - Preparing for Surgery Before surgery, you can play an important role.  Because skin is not sterile, your skin needs to be as free of germs as possible.  You can reduce the number of germs on your skin by washing with CHG (chlorahexidine gluconate) soap before surgery.  CHG is an antiseptic cleaner which kills germs and bonds with the skin to continue killing germs even after washing. Please DO NOT use if you have an allergy to CHG or antibacterial soaps.  If your skin becomes reddened/irritated stop using the CHG  and inform your nurse when you arrive at Short Stay. Do not shave (including legs and underarms) for at least 48 hours prior to the first CHG shower.  You may shave your face/neck. Please follow these instructions carefully:  1.  Shower with CHG Soap the night before surgery and the  morning of Surgery.  2.  If you choose to wash your hair, wash your hair first as usual with your  normal  shampoo.  3.  After you shampoo, rinse your hair and body thoroughly to remove the  shampoo.                           4.  Use CHG as you would any other liquid soap.  You can apply chg directly  to the skin and wash                       Gently with a scrungie or clean washcloth.  5.  Apply the CHG Soap to your body ONLY FROM THE NECK DOWN.   Do not use on face/ open                           Wound or open sores. Avoid contact with eyes, ears mouth and genitals (private parts).                       Wash face,  Genitals (private parts) with your normal soap.             6.  Wash thoroughly, paying special attention to the area where your surgery  will be performed.  7.  Thoroughly rinse your body with warm water from the neck down.  8.  DO NOT shower/wash with your normal soap after using and rinsing off  the CHG Soap.                9.  Pat yourself dry with a clean towel.            10.  Wear clean pajamas.            11.  Place clean sheets on your bed the night of your first shower and do not  sleep with pets. Day of Surgery : Do not apply any lotions/deodorants the morning of surgery.  Please wear clean clothes to the hospital/surgery center.  FAILURE TO FOLLOW THESE INSTRUCTIONS MAY RESULT IN THE CANCELLATION OF YOUR SURGERY PATIENT SIGNATURE_________________________________  NURSE SIGNATURE__________________________________  ________________________________________________________________________  WHAT IS A BLOOD TRANSFUSION? Blood Transfusion Information  A transfusion is the replacement of  blood or some of its parts. Blood is made up of multiple cells which provide different functions.  Red blood cells carry oxygen and are used for blood loss replacement.  White blood cells fight against infection.  Platelets control bleeding.  Plasma helps clot blood.  Other blood products are available for specialized needs, such as hemophilia or other clotting disorders. BEFORE THE TRANSFUSION  Who gives blood for transfusions?   Healthy volunteers who are fully evaluated to make sure their blood is safe. This is blood bank blood. Transfusion therapy is the safest it has ever been in the practice of medicine. Before blood is taken from a donor, a complete history is taken to make sure that person has no history of diseases nor engages in risky social behavior (examples are intravenous drug use or sexual activity with multiple partners). The donor's travel history is screened to minimize risk of transmitting infections, such as malaria. The donated blood is tested for signs of infectious diseases, such as HIV and hepatitis. The blood is then tested to be sure it is compatible with you in order to minimize the chance of a transfusion reaction. If you or a relative donates blood, this is often done in anticipation of surgery and is not appropriate for emergency situations. It takes many days to process the donated blood. RISKS AND COMPLICATIONS Although transfusion therapy is very safe and saves many lives, the main dangers of transfusion include:   Getting an infectious disease.  Developing a transfusion reaction. This is an allergic reaction to something in the blood you were given. Every precaution is taken to prevent  this. The decision to have a blood transfusion has been considered carefully by your caregiver before blood is given. Blood is not given unless the benefits outweigh the risks. AFTER THE TRANSFUSION  Right after receiving a blood transfusion, you will usually feel much better  and more energetic. This is especially true if your red blood cells have gotten low (anemic). The transfusion raises the level of the red blood cells which carry oxygen, and this usually causes an energy increase.  The nurse administering the transfusion will monitor you carefully for complications. HOME CARE INSTRUCTIONS  No special instructions are needed after a transfusion. You may find your energy is better. Speak with your caregiver about any limitations on activity for underlying diseases you may have. SEEK MEDICAL CARE IF:   Your condition is not improving after your transfusion.  You develop redness or irritation at the intravenous (IV) site. SEEK IMMEDIATE MEDICAL CARE IF:  Any of the following symptoms occur over the next 12 hours:  Shaking chills.  You have a temperature by mouth above 102 F (38.9 C), not controlled by medicine.  Chest, back, or muscle pain.  People around you feel you are not acting correctly or are confused.  Shortness of breath or difficulty breathing.  Dizziness and fainting.  You get a rash or develop hives.  You have a decrease in urine output.  Your urine turns a dark color or changes to pink, red, or brown. Any of the following symptoms occur over the next 10 days:  You have a temperature by mouth above 102 F (38.9 C), not controlled by medicine.  Shortness of breath.  Weakness after normal activity.  The white part of the eye turns yellow (jaundice).  You have a decrease in the amount of urine or are urinating less often.  Your urine turns a dark color or changes to pink, red, or brown. Document Released: 09/28/2000 Document Revised: 12/24/2011 Document Reviewed: 05/17/2008 ExitCare Patient Information 2014 ExitCare, [place].  _______________________________________________________________________   CLEAR LIQUID DIET   Foods Allowed                                                                     Foods Excluded  Coffee  and tea, regular and decaf                             liquids that you cannot  Plain Jell-O in any flavor                                             see through such as: Fruit ices (not with fruit pulp)                                     milk, soups, orange juice  Iced Popsicles                                    All solid  food Carbonated beverages, regular and diet                                    Cranberry, grape and apple juices Sports drinks like Gatorade Lightly seasoned clear broth or consume(fat free) Sugar, honey syrup  Sample Menu Breakfast                                Lunch                                     Supper Cranberry juice                    Beef broth                            Chicken broth Jell-O                                     Grape juice                           Apple juice Coffee or tea                        Jell-O                                      Popsicle                                                Coffee or tea                        Coffee or tea  _____________________________________________________________________

## 2016-07-19 ENCOUNTER — Encounter (HOSPITAL_COMMUNITY)
Admission: RE | Admit: 2016-07-19 | Discharge: 2016-07-19 | Disposition: A | Discharge status: Home / Self Care | Payer: BLUE CROSS/BLUE SHIELD | Source: Ambulatory Visit | Attending: Urology | Admitting: Urology

## 2016-07-19 ENCOUNTER — Encounter (HOSPITAL_COMMUNITY): Payer: Self-pay

## 2016-07-19 DIAGNOSIS — N2889 Other specified disorders of kidney and ureter: Secondary | ICD-10-CM | POA: Diagnosis not present

## 2016-07-19 DIAGNOSIS — Z01812 Encounter for preprocedural laboratory examination: Secondary | ICD-10-CM | POA: Diagnosis not present

## 2016-07-19 DIAGNOSIS — Z0183 Encounter for blood typing: Secondary | ICD-10-CM | POA: Insufficient documentation

## 2016-07-19 HISTORY — DX: Asymptomatic varicose veins of left lower extremity: I83.92

## 2016-07-19 LAB — CBC
HCT: 36.9 % (ref 36.0–46.0)
Hemoglobin: 11.7 g/dL — ABNORMAL LOW (ref 12.0–15.0)
MCH: 24.9 pg — ABNORMAL LOW (ref 26.0–34.0)
MCHC: 31.7 g/dL (ref 30.0–36.0)
MCV: 78.5 fL (ref 78.0–100.0)
Platelets: 326 10*3/uL (ref 150–400)
RBC: 4.7 MIL/uL (ref 3.87–5.11)
RDW: 14.3 % (ref 11.5–15.5)
WBC: 5.4 10*3/uL (ref 4.0–10.5)

## 2016-07-19 LAB — BASIC METABOLIC PANEL
Anion gap: 6 (ref 5–15)
BUN: 13 mg/dL (ref 6–20)
CO2: 27 mmol/L (ref 22–32)
Calcium: 9.4 mg/dL (ref 8.9–10.3)
Chloride: 107 mmol/L (ref 101–111)
Creatinine, Ser: 0.66 mg/dL (ref 0.44–1.00)
GFR calc Af Amer: >60 mL/min (ref >60)
GFR calc non Af Amer: >60 mL/min (ref >60)
Glucose, Bld: 93 mg/dL (ref 65–99)
Potassium: 4 mmol/L (ref 3.5–5.1)
Sodium: 140 mmol/L (ref 135–145)

## 2016-07-19 LAB — ABO/RH: ABO/RH(D): A POS

## 2016-07-27 ENCOUNTER — Encounter (HOSPITAL_COMMUNITY)
Admission: RE | Disposition: A | Discharge status: Home / Self Care | Payer: Self-pay | Source: Ambulatory Visit | Attending: Urology

## 2016-07-27 ENCOUNTER — Inpatient Hospital Stay (HOSPITAL_COMMUNITY): Payer: BLUE CROSS/BLUE SHIELD | Admitting: Anesthesiology

## 2016-07-27 ENCOUNTER — Encounter (HOSPITAL_COMMUNITY): Payer: Self-pay | Admitting: Anesthesiology

## 2016-07-27 ENCOUNTER — Inpatient Hospital Stay (HOSPITAL_COMMUNITY)
Admission: RE | Admit: 2016-07-27 | Discharge: 2016-07-30 | DRG: 658 | Disposition: A | Discharge status: Home / Self Care | Payer: BLUE CROSS/BLUE SHIELD | Source: Ambulatory Visit | Attending: Urology | Admitting: Urology

## 2016-07-27 DIAGNOSIS — N200 Calculus of kidney: Secondary | ICD-10-CM | POA: Diagnosis not present

## 2016-07-27 DIAGNOSIS — C642 Malignant neoplasm of left kidney, except renal pelvis: Secondary | ICD-10-CM | POA: Diagnosis not present

## 2016-07-27 DIAGNOSIS — E669 Obesity, unspecified: Secondary | ICD-10-CM | POA: Diagnosis not present

## 2016-07-27 DIAGNOSIS — N2889 Other specified disorders of kidney and ureter: Secondary | ICD-10-CM | POA: Diagnosis not present

## 2016-07-27 DIAGNOSIS — R918 Other nonspecific abnormal finding of lung field: Secondary | ICD-10-CM | POA: Diagnosis present

## 2016-07-27 DIAGNOSIS — Z6835 Body mass index (BMI) 35.0-35.9, adult: Secondary | ICD-10-CM | POA: Diagnosis not present

## 2016-07-27 HISTORY — PX: ROBOTIC ASSITED PARTIAL NEPHRECTOMY: SHX6087

## 2016-07-27 LAB — TYPE AND SCREEN
ABO/RH(D): A POS
Antibody Screen: NEGATIVE

## 2016-07-27 LAB — HEMOGLOBIN AND HEMATOCRIT, BLOOD
HCT: 35.4 % — ABNORMAL LOW (ref 36.0–46.0)
Hemoglobin: 11.2 g/dL — ABNORMAL LOW (ref 12.0–15.0)

## 2016-07-27 SURGERY — NEPHRECTOMY, PARTIAL, ROBOT-ASSISTED
Anesthesia: General | Site: Abdomen | Laterality: Left

## 2016-07-27 MED ORDER — ONDANSETRON HCL 4 MG/2ML IJ SOLN
INTRAMUSCULAR | Status: DC | PRN
  Administered 2016-07-27: 4 mg via INTRAVENOUS

## 2016-07-27 MED ORDER — MANNITOL 25 % IV SOLN
INTRAVENOUS | Status: AC
  Filled 2016-07-27: qty 50

## 2016-07-27 MED ORDER — PROPOFOL 500 MG/50ML IV EMUL
INTRAVENOUS | Status: DC | PRN
  Administered 2016-07-27: 20 ug/kg/min via INTRAVENOUS

## 2016-07-27 MED ORDER — SODIUM CHLORIDE 0.9 % IJ SOLN
INTRAMUSCULAR | Status: DC | PRN
  Administered 2016-07-27: 20 mL via INTRAVENOUS

## 2016-07-27 MED ORDER — MAGNESIUM CITRATE PO SOLN
1.0000 | Freq: Once | ORAL | Status: DC
  Filled 2016-07-27: qty 296

## 2016-07-27 MED ORDER — MIDAZOLAM HCL 2 MG/2ML IJ SOLN
INTRAMUSCULAR | Status: AC
  Filled 2016-07-27: qty 2

## 2016-07-27 MED ORDER — LACTATED RINGERS IV SOLN: INTRAVENOUS | Status: DC

## 2016-07-27 MED ORDER — PROPOFOL 10 MG/ML IV BOLUS
INTRAVENOUS | Status: AC
  Filled 2016-07-27: qty 20

## 2016-07-27 MED ORDER — SUGAMMADEX SODIUM 500 MG/5ML IV SOLN
INTRAVENOUS | Status: DC | PRN
  Administered 2016-07-27: 350 mg via INTRAVENOUS

## 2016-07-27 MED ORDER — ONDANSETRON HCL 4 MG/2ML IJ SOLN
INTRAMUSCULAR | Status: AC
  Filled 2016-07-27: qty 2

## 2016-07-27 MED ORDER — DIPHENHYDRAMINE HCL 50 MG/ML IJ SOLN: 12.5000 mg | Freq: Four times a day (QID) | INTRAMUSCULAR | Status: DC | PRN

## 2016-07-27 MED ORDER — MEPERIDINE HCL 50 MG/ML IJ SOLN: 6.2500 mg | INTRAMUSCULAR | Status: DC | PRN

## 2016-07-27 MED ORDER — HYDROMORPHONE HCL 1 MG/ML IJ SOLN
INTRAMUSCULAR | Status: DC | PRN
  Administered 2016-07-27 (×4): 0.5 mg via INTRAVENOUS

## 2016-07-27 MED ORDER — FENTANYL CITRATE (PF) 100 MCG/2ML IJ SOLN
INTRAMUSCULAR | Status: DC | PRN
  Administered 2016-07-27: 150 ug via INTRAVENOUS
  Administered 2016-07-27 (×2): 50 ug via INTRAVENOUS

## 2016-07-27 MED ORDER — HYDROCODONE-ACETAMINOPHEN 5-325 MG PO TABS: 1.0000 | ORAL_TABLET | Freq: Four times a day (QID) | ORAL | 0 refills | Status: DC | PRN

## 2016-07-27 MED ORDER — DEXAMETHASONE SODIUM PHOSPHATE 10 MG/ML IJ SOLN
INTRAMUSCULAR | Status: AC
  Filled 2016-07-27: qty 1

## 2016-07-27 MED ORDER — DIPHENHYDRAMINE HCL 12.5 MG/5ML PO ELIX: 12.5000 mg | ORAL_SOLUTION | Freq: Four times a day (QID) | ORAL | Status: DC | PRN

## 2016-07-27 MED ORDER — FENTANYL CITRATE (PF) 100 MCG/2ML IJ SOLN
INTRAMUSCULAR | Status: AC
  Filled 2016-07-27: qty 2

## 2016-07-27 MED ORDER — PROPOFOL 10 MG/ML IV BOLUS
INTRAVENOUS | Status: DC | PRN
  Administered 2016-07-27: 200 mg via INTRAVENOUS

## 2016-07-27 MED ORDER — CEFAZOLIN SODIUM-DEXTROSE 2-4 GM/100ML-% IV SOLN
2.0000 g | INTRAVENOUS | Status: DC
  Filled 2016-07-27: qty 100

## 2016-07-27 MED ORDER — CEFAZOLIN SODIUM-DEXTROSE 2-3 GM-% IV SOLR
INTRAVENOUS | Status: DC | PRN
  Administered 2016-07-27: 2 g via INTRAVENOUS

## 2016-07-27 MED ORDER — LACTATED RINGERS IR SOLN
Status: DC | PRN
  Administered 2016-07-27: 1

## 2016-07-27 MED ORDER — OXYCODONE HCL 5 MG PO TABS
5.0000 mg | ORAL_TABLET | ORAL | Status: DC | PRN
  Administered 2016-07-27 – 2016-07-28 (×2): 5 mg via ORAL
  Filled 2016-07-27 (×4): qty 1

## 2016-07-27 MED ORDER — BUPIVACAINE LIPOSOME 1.3 % IJ SUSP
INTRAMUSCULAR | Status: DC | PRN
  Administered 2016-07-27: 20 mL

## 2016-07-27 MED ORDER — STERILE WATER FOR IRRIGATION IR SOLN
Status: DC | PRN
  Administered 2016-07-27: 1000 mL

## 2016-07-27 MED ORDER — METOCLOPRAMIDE HCL 5 MG/ML IJ SOLN: 10.0000 mg | Freq: Once | INTRAMUSCULAR | Status: DC | PRN

## 2016-07-27 MED ORDER — HYDROMORPHONE HCL 2 MG/ML IJ SOLN
INTRAMUSCULAR | Status: AC
  Filled 2016-07-27: qty 1

## 2016-07-27 MED ORDER — SUGAMMADEX SODIUM 200 MG/2ML IV SOLN
INTRAVENOUS | Status: AC
  Filled 2016-07-27: qty 2

## 2016-07-27 MED ORDER — MIDAZOLAM HCL 5 MG/5ML IJ SOLN
INTRAMUSCULAR | Status: DC | PRN
  Administered 2016-07-27: 2 mg via INTRAVENOUS

## 2016-07-27 MED ORDER — BUPIVACAINE LIPOSOME 1.3 % IJ SUSP
INTRAMUSCULAR | Status: AC
  Filled 2016-07-27: qty 20

## 2016-07-27 MED ORDER — ACETAMINOPHEN 500 MG PO TABS
1000.0000 mg | ORAL_TABLET | Freq: Four times a day (QID) | ORAL | Status: AC
  Administered 2016-07-27 – 2016-07-28 (×3): 1000 mg via ORAL
  Filled 2016-07-27 (×3): qty 2

## 2016-07-27 MED ORDER — LACTATED RINGERS IV SOLN
INTRAVENOUS | Status: DC | PRN
  Administered 2016-07-27: 13:00:00 via INTRAVENOUS

## 2016-07-27 MED ORDER — HEMOSTATIC AGENTS (NO CHARGE) OPTIME
TOPICAL | Status: DC | PRN
  Administered 2016-07-27: 1 via TOPICAL

## 2016-07-27 MED ORDER — ROCURONIUM BROMIDE 100 MG/10ML IV SOLN
INTRAVENOUS | Status: DC | PRN
  Administered 2016-07-27 (×3): 10 mg via INTRAVENOUS
  Administered 2016-07-27: 60 mg via INTRAVENOUS

## 2016-07-27 MED ORDER — FENTANYL CITRATE (PF) 100 MCG/2ML IJ SOLN
25.0000 ug | INTRAMUSCULAR | Status: DC | PRN
  Administered 2016-07-27 (×2): 50 ug via INTRAVENOUS

## 2016-07-27 MED ORDER — SODIUM CHLORIDE 0.9 % IJ SOLN
INTRAMUSCULAR | Status: AC
  Filled 2016-07-27: qty 50

## 2016-07-27 MED ORDER — SUGAMMADEX SODIUM 500 MG/5ML IV SOLN
INTRAVENOUS | Status: AC
  Filled 2016-07-27: qty 5

## 2016-07-27 MED ORDER — HYDROMORPHONE HCL 1 MG/ML IJ SOLN
0.5000 mg | INTRAMUSCULAR | Status: DC | PRN
  Administered 2016-07-28: 1 mg via INTRAVENOUS
  Filled 2016-07-27 (×2): qty 1

## 2016-07-27 MED ORDER — LIDOCAINE HCL (CARDIAC) 20 MG/ML IV SOLN
INTRAVENOUS | Status: DC | PRN
  Administered 2016-07-27: 50 mg via INTRAVENOUS

## 2016-07-27 MED ORDER — PHENYLEPHRINE HCL 10 MG/ML IJ SOLN
INTRAMUSCULAR | Status: AC
  Filled 2016-07-27: qty 1

## 2016-07-27 MED ORDER — DEXTROSE-NACL 5-0.45 % IV SOLN
INTRAVENOUS | Status: DC
  Administered 2016-07-27: 18:00:00 via INTRAVENOUS

## 2016-07-27 MED ORDER — ROCURONIUM BROMIDE 50 MG/5ML IV SOSY
PREFILLED_SYRINGE | INTRAVENOUS | Status: AC
  Filled 2016-07-27: qty 5

## 2016-07-27 MED ORDER — ONDANSETRON HCL 4 MG/2ML IJ SOLN
4.0000 mg | INTRAMUSCULAR | Status: DC | PRN
  Administered 2016-07-27: 4 mg via INTRAVENOUS
  Filled 2016-07-27 (×2): qty 2

## 2016-07-27 MED ORDER — DEXAMETHASONE SODIUM PHOSPHATE 10 MG/ML IJ SOLN
INTRAMUSCULAR | Status: DC | PRN
  Administered 2016-07-27: 10 mg via INTRAVENOUS

## 2016-07-27 MED ORDER — CEFAZOLIN SODIUM-DEXTROSE 2-4 GM/100ML-% IV SOLN
INTRAVENOUS | Status: AC
  Filled 2016-07-27: qty 100

## 2016-07-27 MED ORDER — FENTANYL CITRATE (PF) 250 MCG/5ML IJ SOLN
INTRAMUSCULAR | Status: AC
  Filled 2016-07-27: qty 5

## 2016-07-27 MED ORDER — LACTATED RINGERS IV SOLN
INTRAVENOUS | Status: DC | PRN
  Administered 2016-07-27 (×2): via INTRAVENOUS

## 2016-07-27 SURGICAL SUPPLY — 71 items
APPLICATOR SURGIFLO ENDO (HEMOSTASIS) ×2 IMPLANT
CHLORAPREP W/TINT 26ML (MISCELLANEOUS) ×2 IMPLANT
CLIP LIGATING HEM O LOK PURPLE (MISCELLANEOUS) ×2 IMPLANT
CLIP LIGATING HEMO LOK XL GOLD (MISCELLANEOUS) ×2 IMPLANT
CLIP LIGATING HEMO O LOK GREEN (MISCELLANEOUS) ×8 IMPLANT
CLIP SUT LAPRA TY ABSORB (SUTURE) ×8 IMPLANT
COVER SURGICAL LIGHT HANDLE (MISCELLANEOUS) ×2 IMPLANT
COVER TIP SHEARS 8 DVNC (MISCELLANEOUS) ×1 IMPLANT
COVER TIP SHEARS 8MM DA VINCI (MISCELLANEOUS) ×1
DECANTER SPIKE VIAL GLASS SM (MISCELLANEOUS) ×2 IMPLANT
DERMABOND ADVANCED (GAUZE/BANDAGES/DRESSINGS) ×1
DERMABOND ADVANCED .7 DNX12 (GAUZE/BANDAGES/DRESSINGS) ×1 IMPLANT
DRAIN CHANNEL 15F RND FF 3/16 (WOUND CARE) ×2 IMPLANT
DRAPE ARM DVNC X/XI (DISPOSABLE) ×4 IMPLANT
DRAPE COLUMN DVNC XI (DISPOSABLE) ×1 IMPLANT
DRAPE DA VINCI XI ARM (DISPOSABLE) ×4
DRAPE DA VINCI XI COLUMN (DISPOSABLE) ×1
DRAPE INCISE IOBAN 66X45 STRL (DRAPES) ×2 IMPLANT
DRAPE LAPAROSCOPIC ABDOMINAL (DRAPES) ×2 IMPLANT
DRAPE SHEET LG 3/4 BI-LAMINATE (DRAPES) ×2 IMPLANT
DRSG TEGADERM 4X4.75 (GAUZE/BANDAGES/DRESSINGS) ×2 IMPLANT
ELECT PENCIL ROCKER SW 15FT (MISCELLANEOUS) ×2 IMPLANT
ELECT REM PT RETURN 9FT ADLT (ELECTROSURGICAL) ×2
ELECTRODE REM PT RTRN 9FT ADLT (ELECTROSURGICAL) ×1 IMPLANT
EVACUATOR SILICONE 100CC (DRAIN) ×2 IMPLANT
GAUZE SPONGE 2X2 8PLY STRL LF (GAUZE/BANDAGES/DRESSINGS) ×1 IMPLANT
GLOVE BIO SURGEON STRL SZ 6.5 (GLOVE) ×2 IMPLANT
GLOVE BIOGEL M STRL SZ7.5 (GLOVE) ×4 IMPLANT
GOWN STRL REUS W/TWL LRG LVL3 (GOWN DISPOSABLE) ×4 IMPLANT
HEMOSTAT SURGICEL 4X8 (HEMOSTASIS) ×2 IMPLANT
IRRIG SUCT STRYKERFLOW 2 WTIP (MISCELLANEOUS)
IRRIGATION SUCT STRKRFLW 2 WTP (MISCELLANEOUS) IMPLANT
KIT BASIN OR (CUSTOM PROCEDURE TRAY) ×2 IMPLANT
LIQUID BAND (GAUZE/BANDAGES/DRESSINGS) ×2 IMPLANT
LOOP VESSEL MAXI BLUE (MISCELLANEOUS) ×2 IMPLANT
MARKER SKIN DUAL TIP RULER LAB (MISCELLANEOUS) ×2 IMPLANT
NEEDLE INSUFFLATION 14GA 120MM (NEEDLE) ×2 IMPLANT
NS IRRIG 1000ML POUR BTL (IV SOLUTION) ×2 IMPLANT
PORT ACCESS TROCAR AIRSEAL 12 (TROCAR) ×2 IMPLANT
PORT ACCESS TROCAR AIRSEAL 5M (TROCAR) ×2
POSITIONER SURGICAL ARM (MISCELLANEOUS) ×4 IMPLANT
POUCH SPECIMEN RETRIEVAL 10MM (ENDOMECHANICALS) ×4 IMPLANT
RELOAD STAPLER WHITE 60MM (STAPLE) IMPLANT
SET TRI-LUMEN FLTR TB AIRSEAL (TUBING) ×2 IMPLANT
SOLUTION ELECTROLUBE (MISCELLANEOUS) ×2 IMPLANT
SPOGE SURGIFLO 8M (HEMOSTASIS) ×1
SPONGE GAUZE 2X2 STER 10/PKG (GAUZE/BANDAGES/DRESSINGS) ×1
SPONGE LAP 4X18 X RAY DECT (DISPOSABLE) ×2 IMPLANT
SPONGE SURGIFLO 8M (HEMOSTASIS) ×1 IMPLANT
STAPLE ECHEON FLEX 60 POW ENDO (STAPLE) IMPLANT
STAPLER RELOAD WHITE 60MM (STAPLE)
SURGIFLO W/THROMBIN 8M KIT (HEMOSTASIS) ×2 IMPLANT
SUT ETHILON 3 0 PS 1 (SUTURE) ×2 IMPLANT
SUT MNCRL AB 4-0 PS2 18 (SUTURE) ×4 IMPLANT
SUT PDS AB 1 CT1 27 (SUTURE) ×4 IMPLANT
SUT V-LOC BARB 180 2/0GR6 GS22 (SUTURE) ×2
SUT VIC AB 0 CT1 27 (SUTURE) ×6
SUT VIC AB 0 CT1 27XBRD ANTBC (SUTURE) ×6 IMPLANT
SUT VIC AB 2-0 SH 27 (SUTURE) ×2
SUT VIC AB 2-0 SH 27X BRD (SUTURE) ×2 IMPLANT
SUT VLOC BARB 180 ABS3/0GR12 (SUTURE) ×2
SUTURE V-LC BRB 180 2/0GR6GS22 (SUTURE) ×1 IMPLANT
SUTURE VLOC BRB 180 ABS3/0GR12 (SUTURE) ×1 IMPLANT
TAPE STRIPS DRAPE STRL (GAUZE/BANDAGES/DRESSINGS) ×2 IMPLANT
TOWEL OR 17X26 10 PK STRL BLUE (TOWEL DISPOSABLE) ×4 IMPLANT
TOWEL OR NON WOVEN STRL DISP B (DISPOSABLE) ×2 IMPLANT
TRAY FOLEY W/METER SILVER 16FR (SET/KITS/TRAYS/PACK) IMPLANT
TRAY LAPAROSCOPIC (CUSTOM PROCEDURE TRAY) ×2 IMPLANT
TROCAR BLADELESS OPT 5 100 (ENDOMECHANICALS) IMPLANT
TROCAR XCEL 12X100 BLDLESS (ENDOMECHANICALS) ×2 IMPLANT
WATER STERILE IRR 1500ML POUR (IV SOLUTION) ×4 IMPLANT

## 2016-07-27 NOTE — Discharge Instructions (Signed)

## 2016-07-27 NOTE — Anesthesia Postprocedure Evaluation (Signed)
Anesthesia Post Note  Patient: Candace Graham  Procedure(s) Performed: Procedure(s) (LRB): XI ROBOTIC ASSITED LEFT PARTIAL NEPHRECTOMY (Left)  Patient location during evaluation: PACU Anesthesia Type: General Level of consciousness: awake Pain management: pain level controlled Vital Signs Assessment: post-procedure vital signs reviewed and stable Cardiovascular status: stable    Last Vitals:  Vitals:   07/27/16 1105 07/27/16 1630  BP: 127/75   Pulse: 70 86  Resp: 16 15  Temp: 36.7 C 36.3 C    Last Pain:  Vitals:   07/27/16 1105  TempSrc: Oral                 EDWARDS,Fantasia Jinkins

## 2016-07-27 NOTE — Brief Op Note (Signed)
07/27/2016  4:03 PM  PATIENT:  Candace Graham  45 y.o. female  PRE-OPERATIVE DIAGNOSIS:  LEFT RENAL MASS  POST-OPERATIVE DIAGNOSIS:  LEFT RENAL MASS  PROCEDURE:  Procedure(s): XI ROBOTIC ASSITED LEFT PARTIAL NEPHRECTOMY (Left)  SURGEON:  Surgeon(s) and Role:    * Alexis Frock, MD - Primary  PHYSICIAN ASSISTANT:   ASSISTANTS: Debbrah Alar, PA   ANESTHESIA:   general  EBL:  Total I/O In: 1600 [I.V.:1600] Out: 350 [Urine:300; Blood:50]  BLOOD ADMINISTERED:none  DRAINS: 1 - JP to bulb, 2 - Foley to gravity   LOCAL MEDICATIONS USED:  MARCAINE     SPECIMEN:  Source of Specimen:  left partial nephrectomy, deep margin left partial nephrectomy  DISPOSITION OF SPECIMEN:  PATHOLOGY  COUNTS:  YES  TOURNIQUET:  * No tourniquets in log *  DICTATION: .Other Dictation: Dictation Number [id number]  PLAN OF CARE: Admit to inpatient   PATIENT DISPOSITION:  PACU - hemodynamically stable.   Delay start of Pharmacological VTE agent (>24hrs) due to surgical blood loss or risk of bleeding: yes

## 2016-07-27 NOTE — H&P (Signed)
Candace Graham is an 45 y.o. female.    Chief Complaint: Pre-op LEFT robotic partial nephrectomy  HPI:   1 - Left Renal Mass - 2.5-3cm 60% endophytic left mid lateral mass by CT, PET, MRI 2017 on eval non-specific left abdominal pain and diffuse pulmonary nodules. Mass is solitary and very PET avid. 1 artery / 1 vein left renovascular anatomy. No FHX renal cancer. 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 Weakly PET avid, but most too small to characterize. Had guided BX of dominant nodule 05/2016 with fibrosis / non-diagnostic. She is following with Candace Graham for this.  PMH sig for obesity,endometrial polyp removal (vagial approach). NO CV disease / blood thinners. No chest / abd surgery. Her PCP is Terri Piedra NP.   Today "Candace Graham" is seen to proceed with LEFT robotic partial nephrectomy.    Past Medical History:  Diagnosis Date  . Abnormal uterine bleeding    Heavy menstrual cycles  . Anemia   . Fibroid tumor   . Vaginal delivery 1991, 1999  . Varicose veins of left lower extremity     Past Surgical History:  Procedure Laterality Date  . DILATATION & CURRETTAGE/HYSTEROSCOPY WITH RESECTOCOPE N/A 04/25/2015   Procedure: DILATATION & CURETTAGE/HYSTEROSCOPY WITH RESECTOCOPE;  Surgeon: Megan Salon, Graham;  Location: [facility];  Service: Gynecology;  Laterality: N/A;  . LUNG BIOPSY    . uterine polyp removal      Family History  Problem Relation Age of Onset  . Pancreatic cancer Mother   . Hypertension Mother   . Stroke Mother   . Thyroid disease Mother   . Hypertension Brother   . Hypertension Brother   . Heart disease Father   . Arthritis Father   . Alcohol abuse Maternal Grandmother   . Esophageal cancer Maternal Grandmother   . Drug abuse Maternal Grandfather    Social History:  reports that she has never smoked. She has never used smokeless tobacco. She reports that she does not drink  alcohol or use drugs.  Allergies: No Known Allergies  No prescriptions prior to admission.    No results found for this or any previous visit (from the past 48 hour(s)). No results found.  Review of Systems  Constitutional: Negative.  Negative for chills and fever.  HENT: Negative.   Eyes: Negative.   Respiratory: Negative.   Cardiovascular: Negative.   Gastrointestinal: Negative.   Genitourinary: Negative.   Musculoskeletal: Negative.   Skin: Negative.   Neurological: Negative.   Endo/Heme/Allergies: Negative.   Psychiatric/Behavioral: Negative.     Last menstrual period 08/15/2014. Physical Exam  Constitutional: She is oriented to person, place, and time. She appears well-developed.  HENT:  Head: Normocephalic.  Eyes: Pupils are equal, round, and reactive to light.  Neck: Normal range of motion.  Cardiovascular: Normal rate.   Respiratory: Effort normal.  GI: Soft.  Moderate truncal obesity.   Genitourinary:  Genitourinary Comments: No CVAT  Musculoskeletal: Normal range of motion.  Neurological: She is alert and oriented to person, place, and time.  Skin: Skin is warm.  Psychiatric: She has a normal mood and affect. Her behavior is normal. Thought content normal.     Assessment/Plan  Left Mass certainly worrisome for primary renal cancer.   I feel optimal management would be left partial nephrectomy. This will allow for local treatment and best histologic characterization.   Risks, benefits, alternatives, expected peri-op course discussed previously  and she would like to proceed today as planned.    Alexis Frock, Graham 07/27/2016, 6:41 AM

## 2016-07-27 NOTE — Anesthesia Preprocedure Evaluation (Signed)
Anesthesia Evaluation  Patient identified by MRN, date of birth, ID band Patient awake    Reviewed: Allergy & Precautions, NPO status , Patient's Chart, lab work & pertinent test results  Airway Mallampati: II  TM Distance: >3 FB Neck ROM: Full    Dental no notable dental hx.    Pulmonary neg pulmonary ROS,    Pulmonary exam normal breath sounds clear to auscultation       Cardiovascular negative cardio ROS Normal cardiovascular exam Rhythm:Regular Rate:Normal     Neuro/Psych negative neurological ROS  negative psych ROS   GI/Hepatic negative GI ROS, Neg liver ROS,   Endo/Other  negative endocrine ROS  Renal/GU negative Renal ROS  negative genitourinary   Musculoskeletal negative musculoskeletal ROS (+)   Abdominal   Peds negative pediatric ROS (+)  Hematology negative hematology ROS (+)   Anesthesia Other Findings   Reproductive/Obstetrics negative OB ROS                             Anesthesia Physical Anesthesia Plan  ASA: II  Anesthesia Plan: General   Post-op Pain Management:    Induction: Intravenous  Airway Management Planned: Oral ETT  Additional Equipment:   Intra-op Plan:   Post-operative Plan: Extubation in OR  Informed Consent: I have reviewed the patients History and Physical, chart, labs and discussed the procedure including the risks, benefits and alternatives for the proposed anesthesia with the patient or authorized representative who has indicated his/her understanding and acceptance.   Dental advisory given  Plan Discussed with: CRNA  Anesthesia Plan Comments:         Anesthesia Quick Evaluation  

## 2016-07-27 NOTE — Transfer of Care (Signed)
Immediate Anesthesia Transfer of Care Note  Patient: Candace Graham  Procedure(s) Performed: Procedure(s): XI ROBOTIC ASSITED LEFT PARTIAL NEPHRECTOMY (Left)  Patient Location: PACU  Anesthesia Type:General  Level of Consciousness:  sedated, patient cooperative and responds to stimulation  Airway & Oxygen Therapy:Patient Spontanous Breathing and Patient connected to face mask oxgen  Post-op Assessment:  Report given to PACU RN and Post -op Vital signs reviewed and stable  Post vital signs:  Reviewed and stable  Last Vitals:  Vitals:   07/27/16 1105 07/27/16 1630  BP: 127/75   Pulse: 70 86  Resp: 16 15  Temp: 36.7 C [id number] C    Complications: No apparent anesthesia complications

## 2016-07-27 NOTE — Anesthesia Procedure Notes (Signed)
Procedure Name: Intubation Date/Time: 07/27/2016 1:22 PM Performed by: Janee Ureste, Virgel Gess Pre-anesthesia Checklist: Patient identified, Emergency Drugs available, Suction available, Patient being monitored and Timeout performed Patient Re-evaluated:Patient Re-evaluated prior to inductionOxygen Delivery Method: Circle system utilized Preoxygenation: Pre-oxygenation with 100% oxygen Intubation Type: IV induction Ventilation: Mask ventilation without difficulty Laryngoscope Size: Mac and 4 Grade View: Grade II Tube type: Oral Tube size: 7.5 mm Number of attempts: 1 Airway Equipment and Method: Stylet Placement Confirmation: ETT inserted through vocal cords under direct vision,  positive ETCO2,  CO2 detector and breath sounds checked- equal and bilateral Secured at: 22 cm Tube secured with: Tape Dental Injury: Teeth and Oropharynx as per pre-operative assessment

## 2016-07-28 LAB — BASIC METABOLIC PANEL
Anion gap: 8 (ref 5–15)
BUN: 9 mg/dL (ref 6–20)
CO2: 25 mmol/L (ref 22–32)
Calcium: 8.9 mg/dL (ref 8.9–10.3)
Chloride: 105 mmol/L (ref 101–111)
Creatinine, Ser: 0.72 mg/dL (ref 0.44–1.00)
GFR calc Af Amer: >60 mL/min (ref >60)
GFR calc non Af Amer: >60 mL/min (ref >60)
Glucose, Bld: 141 mg/dL — ABNORMAL HIGH (ref 65–99)
Potassium: 4.1 mmol/L (ref 3.5–5.1)
Sodium: 138 mmol/L (ref 135–145)

## 2016-07-28 LAB — HEMOGLOBIN AND HEMATOCRIT, BLOOD
HCT: 35.3 % — ABNORMAL LOW (ref 36.0–46.0)
Hemoglobin: 11.5 g/dL — ABNORMAL LOW (ref 12.0–15.0)

## 2016-07-28 MED ORDER — SENNA 8.6 MG PO TABS
1.0000 | ORAL_TABLET | Freq: Every day | ORAL | Status: DC
  Administered 2016-07-28 – 2016-07-30 (×3): 8.6 mg via ORAL
  Filled 2016-07-28 (×3): qty 1

## 2016-07-28 MED ORDER — LACTATED RINGERS IV SOLN
INTRAVENOUS | Status: DC
  Administered 2016-07-28: 10:00:00 via INTRAVENOUS

## 2016-07-28 MED ORDER — KETOROLAC TROMETHAMINE 30 MG/ML IJ SOLN
30.0000 mg | Freq: Once | INTRAMUSCULAR | Status: AC
  Administered 2016-07-28: 30 mg via INTRAVENOUS
  Filled 2016-07-28: qty 1

## 2016-07-28 MED ORDER — ACETAMINOPHEN 325 MG PO TABS
650.0000 mg | ORAL_TABLET | Freq: Four times a day (QID) | ORAL | Status: DC | PRN
  Administered 2016-07-29 – 2016-07-30 (×2): 650 mg via ORAL
  Filled 2016-07-28 (×2): qty 2

## 2016-07-28 MED ORDER — DOCUSATE SODIUM 100 MG PO CAPS
100.0000 mg | ORAL_CAPSULE | Freq: Two times a day (BID) | ORAL | Status: DC
  Administered 2016-07-28 – 2016-07-30 (×5): 100 mg via ORAL
  Filled 2016-07-28 (×5): qty 1

## 2016-07-28 NOTE — Progress Notes (Signed)
UROLOGY PROGRESS NOTES  Assessment/Plan: Candace Graham is a 45 y.o. female with a history of uterine fibroids, anemia, left renal mass s/p RAL left partial nephrectomy 07/27/16 with Dr. Tresa Moore.   Interval/Plan: OOB/ambulate, d/c bed rest. Discontinue Foley, TOV today. Start stool softeners. One time dose of toradol this morning. Continue on IVF at 9, clears this morning; potential regular diet & medlock later.   Neuro: - pain control with PRN tylenol, oxycodone, dilaudid; one time 30mg  toradol this morning - PRN benadryl  Respiratory: SORA  CV: HDS  FEN/GI: - LR @ 75cc/hr, replete lytes prn, clear liquid diet; potentially medlock & advance to reg diet later today - bowel regimen with colace/senna - PRN zofran   GU: - monitor UOP - discontinue Foley catheter today - creatinine 0.72  Heme/ID: - afebrile, stable - Hb 11.5, continue to monitor  PPx: OOB/IS  Dispo: Floor status, likely discharge POD2   Subjective: Nausea with emesis x1 last evening. Significant left flank/abdominal pain this morning. Thinks tylenol not helping much, but oxycodone helped some. Tolerating clears. Not OOB yet. No flatus. Current pain is 6/10.   Objective:  Vital signs in last 24 hours: Temp:  [97.2 F (36.2 C)-98.1 F (36.7 C)] 98.1 F (36.7 C) (10/14 0433) Pulse Rate:  [67-86] 80 (10/14 0433) Resp:  [10-16] 16 (10/14 0433) BP: (129-148)/(79-94) 148/92 (10/14 0433) SpO2:  [99 %-100 %] 100 % (10/14 0433) Weight:  [225 lb (102.1 kg)] 225 lb (102.1 kg) (10/13 1729)  10/13 0701 - 10/14 0700 In: 3337.9 [P.O.:240; I.V.:3097.9] Out: 1970 [Urine:1900; Drains:20; Blood:50]    Physical Exam:  General:  well-developed and well-nourished female in NAD, lying in bed, alert & oriented, pleasant, seems tired and uncomfortable HEENT: [place]/AT, EOMI, sclera anicteric, hearing grossly intact, no nasal discharge, MMM Respiratory: nonlabored respirations, satting well on RA, symmetrical chest  rise Cardiovascular: pulse regular rate & rhythm Abdominal: soft, appropriately TTP, nondistended, surgical incisions c/d/i without signs of exudate/erythema GU: Foley catheter draining yellow urine; JP drain with minimal SS drainage in bulb Extremities: warm, well-perfused, no c/c/e Neuro: no focal deficits   Data Review: Results for orders placed or performed during the hospital encounter of 07/27/16 (from the past 24 hour(s))  Hemoglobin and hematocrit, blood     Status: Abnormal   Collection Time: 07/27/16  5:01 PM  Result Value Ref Range   Hemoglobin 11.2 (L) 12.0 - 15.0 g/dL   HCT 35.4 (L) 36.0 - [id number] %  Basic metabolic panel     Status: Abnormal   Collection Time: 07/28/16  6:02 AM  Result Value Ref Range   Sodium 138 135 - 145 mmol/L   Potassium 4.1 3.5 - 5.1 mmol/L   Chloride 105 101 - 111 mmol/L   CO2 25 22 - 32 mmol/L   Glucose, Bld 141 (H) 65 - 99 mg/dL   BUN 9 6 - 20 mg/dL   Creatinine, Ser 0.72 0.44 - 1.00 mg/dL   Calcium 8.9 8.9 - 10.3 mg/dL   GFR calc non Af Amer >60 >60 mL/min   GFR calc Af Amer >60 >60 mL/min   Anion gap 8 5 - 15  Hemoglobin and hematocrit, blood     Status: Abnormal   Collection Time: 07/28/16  6:02 AM  Result Value Ref Range   Hemoglobin 11.5 (L) 12.0 - 15.0 g/dL   HCT 35.3 (L) 36.0 - 46.0 %    Imaging: None

## 2016-07-28 NOTE — Op Note (Signed)
NAMEMarland Graham  BLUMA, TOBE NO.:  [id number]  MEDICAL RECORD NO.:  [id number]  LOCATION:  [id number]                         FACILITY:  [facility]  PHYSICIAN:  Alexis Frock, MD     DATE OF BIRTH:  [birth year]  DATE OF PROCEDURE: 07/27/2016                              OPERATIVE REPORT   DIAGNOSIS:  Left enhancing renal mass.  PROCEDURES: 1. Robotic-assisted laparoscopic left partial nephrectomy. 2. Intraoperative ultrasound with interpretation.  ESTIMATED BLOOD LOSS:  50 mL.  COMPLICATIONS:  None.  SPECIMENS: 1. Left partial nephrectomy. 2. Final deep margin left partial nephrectomy.  ASSISTANT:  Debbrah Alar, PA.  WARM ISCHEMIA TIME:  19 minutes.  FINDINGS: 1. Approximately 50% endophytic solid hyperechoic left renal mass with     intraoperative ultrasound. 2. Single artery, single vein, left renovascular anatomy as     anticipated.  INDICATION:  [name] is a pleasant 45 year old lady who was found on workup of nonspecific abdominal pain to have a solid enhancing left renal mass.  This was further confirmed by dedicated MRI.  This is concerning for localized renal cell carcinoma.  She does have some small equivocal multifocal pulmonary nodules of unknown etiology.  Biopsy has favored non-malignant etiology.  Options were discussed for management of her renal mass including surveillance protocols versus surgical extirpation with and without nephron-sparing versus ablative therapies and she has elected to undergo left partial nephrectomy.  Informed consent was obtained and placed in the medical record.  PROCEDURE IN DETAIL:  The patient being Candace Graham verified. Procedure being left robotic partial nephrectomy was confirmed. Procedure was carried out.  Time-out was performed.  Intravenous antibiotics were administered.  General endotracheal anesthesia introduced.  The patient placed into a left side up full flank position, applying 15 degrees of  table flexion, superior arm elevator, axillary roll, sequential compression devices, bottom leg bent, top leg straight after Foley catheter was placed per urethra to straight drain.  She was further fashioned on the operative table using a beanbag and a 3-inch tape over padding across her supraxiphoid chest and her pelvis.  She is currently positioned.  Sterile field was created by prepping the patient's left flank and abdomen using chlorhexidine gluconate.  Next, a high-flow, low-pressure pneumoperitoneum was obtained using Veress technique in the left lower quadrant having passed the aspiration and drop test.  Next, an 8-mm robotic camera port was placed in position approximately 1 handbreadth superior lateral to the umbilicus. Laparoscopic examination of the peritoneal cavity revealed no significant adhesions and no visceral injury.  Additional ports were placed as follows; left subcostal 8-mm robotic port, left far lateral 8- mm robotic port approximately 4 fingerbreadths superior medial to the anterior iliac spine, left paramedian inferior robotic port approximately 1 handbreadth superior to the pubic ramus, two 12 mm assistant ports in the midline, one in the infraumbilical crease and another approximately 2 fingerbreadths above the camera port.  Robot was docked and passed through electronic checks.  Initial attention was directed at development of retroperitoneum.  Incision was made lateral to the descending colon from the area of the splenic flexure towards the area of the internal ring.  The colon was carefully swept medially. Lateral splenic attachments were  taken down allowing the spleen to rotate medially, allowing it to self retract away from the anterior surface of the kidney.  Lower pole of kidney was identified, placed on gentle lateral traction.  Dissection proceeded medial to this.  The ureter and gonadal vessels and psoas musculature were encountered.  The ureter and  gonadal vessels were placed on gentle lateral traction. Dissection proceeded within this triangle superiorly towards the area of the renal hilum.  Renal hilum consisted of single artery, single vein, left renovascular anatomy.  There was very prominent adrenal vein noted as anticipated.  Both these structures were circumferentially mobilized. Vessel loop was placed on the proximal arterial trunk.  Next, attention was directed at identification of the mass.  The mass was seen in the midportion of the kidney laterally, and there was no evidence of locally advanced disease.  Keeping purposely a fat pad overlying the tumor, it was circumferentially mobilized allowing better inspection of the renal parenchyma vas interface circumferentially.  This interface was somewhat diffuse given especially some lobulation of the kidney, therefore it was felt that intraoperative ultrasound was warranted and this was performed using drop-in probe.  Intraoperative ultrasound revealed a hyperechoic solid mass, approximately 50% endophytic within the mid lateral aspect of the left kidney.  Using combination of ultrasound, indirect visualization, the circumferential deep margin was scored on the outer aspect of the kidney to guide partial nephrectomy.  Drop-in probe was then removed.  Warm ischemia was achieved by placing 2 small bulldog clamps on the artery alone and very careful partial nephrectomy was performed using cold scissors keeping what appeared to be a rim of normal-appearing parenchyma with the partial nephrectomy specimen, which was set aside and placed in an EndoCatch bag for later retrieval.  There were several obvious entrances into venous sinuses as well as a single clear entrance into the collecting system purposefully to achieve, it was likely negative margin.  A deep aspect of this was set aside for permanent pathology, labeled as deep margin final.  Next, first renorrhaphy was performed  using a running 3-0 V-Loc suture over-sewing the small collecting system entrance as well as venous sinus entrances and a second 3-0 V-Loc was also used in similar fashion for second-line renorrhaphy.  A Surgicel bolster was applied and 3 parenchymal apposition sutures of large Vicryl sandwiched between Hem-O-Lok and lapper ties were then applied, which resulted in excellent parenchymal apposition.  Bulldogs were released for total warm ischemia time of 19 minutes, resulted in excellent hemostasis.  All needles, vessel loops, and clamps were removed.  Counts were correct.  There was excellent hemostasis.  The retroperitoneum was loosely reapproximated using another running Vicryl loosely anchoring the area of the splenic flexure back over the anterior aspect of the kidney, this retroperitonealized once again.  A closed suction drain was brought out the previous lateral most robotic port site near the peritoneal cavity.  Robot was then undocked.  The previous superior 12 mm assistant port site was closed over the fascia using Carter-Thomason suture passer and 0-Vicryl and the specimen was retrieved by extending the inferior more assistant port site for a total distance of approximately 3 cm removing the partial nephrectomy specimen, setting aside for permanent pathology.  This site was closed at the level of fascia using figure-of-eight PDS x3.  All incision sites were infiltrated with dilute lyophilized Marcaine.  The extraction site was closed at the level of Scarpa's using running Vicryl and all incision sites were closed at the level of  the skin using subcuticular Monocryl followed by Dermabond and the procedure was terminated.  The patient tolerated the procedure well.  No immediate periprocedural complications.  The patient was taken to the postanesthesia care unit in a stable condition.          ______________________________ Alexis Frock, MD     TM/MEDQ  D:  07/27/2016   T:  07/28/2016  Job:  [id number]

## 2016-07-28 NOTE — Progress Notes (Signed)
Patient ambulated x1 time down hall and back.  Tolerated well.  Still feeling groggy.  Family at bedside; will continue to monitor.

## 2016-07-28 NOTE — Progress Notes (Signed)
Attempted to ambulate patient.  Patient did stand next to bed in room for several minutes, but refused to ambulate at this time d/t c/o grogginess.  Educated patient on importance of ambulation post-op, patient verbalized understanding.  Encouraged IS and will attempt to ambulate patient again before end of shift.

## 2016-07-29 LAB — BASIC METABOLIC PANEL
Anion gap: 5 (ref 5–15)
BUN: 15 mg/dL (ref 6–20)
CO2: 26 mmol/L (ref 22–32)
Calcium: 8.4 mg/dL — ABNORMAL LOW (ref 8.9–10.3)
Chloride: 108 mmol/L (ref 101–111)
Creatinine, Ser: 0.95 mg/dL (ref 0.44–1.00)
GFR calc Af Amer: >60 mL/min (ref >60)
GFR calc non Af Amer: >60 mL/min (ref >60)
Glucose, Bld: 113 mg/dL — ABNORMAL HIGH (ref 65–99)
Potassium: 4.1 mmol/L (ref 3.5–5.1)
Sodium: 139 mmol/L (ref 135–145)

## 2016-07-29 LAB — HEMOGLOBIN AND HEMATOCRIT, BLOOD
HCT: 31.6 % — ABNORMAL LOW (ref 36.0–46.0)
Hemoglobin: 10.3 g/dL — ABNORMAL LOW (ref 12.0–15.0)

## 2016-07-29 MED ORDER — LIDOCAINE 5 % EX PTCH
1.0000 | MEDICATED_PATCH | CUTANEOUS | Status: DC
  Filled 2016-07-29 (×2): qty 1

## 2016-07-29 MED ORDER — OXYCODONE HCL 5 MG PO TABS
5.0000 mg | ORAL_TABLET | ORAL | Status: DC | PRN
  Administered 2016-07-29: 5 mg via ORAL

## 2016-07-29 NOTE — Progress Notes (Signed)
UROLOGY PROGRESS NOTES  Assessment/Plan: Candace Graham is a 45 y.o. female with a history of uterine fibroids, anemia, left renal mass s/p RAL left partial nephrectomy 07/27/16 with Dr. Tresa Moore.   Interval/Plan: Patient initially discharged earlier today after meeting all requirements for discharge. She had a temp of 99.9 overnight that we attributed to atelectasis. She is concerned about this. She has also been incredibly reluctant to take any PO pain meds today and as a result has had pain that has prevented her to ambulate. Otherwise, adequate UOP, JP drain already removed with low output and in preparation of d/c. Cr 0.95 this morning. Hb 10.3 from 11.5, likely dilutional. No concern for active bleed. Will add lidoderm patch.   Neuro: - pain control with PRN tylenol, oxycodone, dilaudid; lidoderm patch - PRN benadryl  Respiratory: SORA  CV: HDS  FEN/GI: - medlocked, replete lytes prn, regular diet - bowel regimen with colace/senna - PRN zofran   GU: - monitor UOP, voiding spontaneously - JP out earlier today (50cc total output yesterday, 10 overnight) - creatinine 0.95  Heme/ID: - afebrile, stable - Hb 10.3, continue to monitor  PPx: OOB/IS  Dispo: Floor status   Subjective: Did well and initially discharged earlier today but had concern that someone overnight told her that she had a fever and pain not controlled very well, so not ambulating well. She had nausea on the evening of POD0 with tylenol, likely related to anesthesia and empty stomach, and has since been very reluctant to take any PO PRNs. Has not taken any pain medications today.   Objective:  Vital signs in last 24 hours: Temp:  [98.2 F (36.8 C)-100 F (37.8 C)] 98.6 F (37 C) (10/15 1300) Pulse Rate:  [69-101] 101 (10/15 0536) Resp:  [16-17] 16 (10/15 0536) BP: (99-116)/(53-57) 116/53 (10/15 0536) SpO2:  [97 %-100 %] 97 % (10/15 0536)  10/14 0701 - 10/15 0700 In: [id number] [P.O.:840; I.V.:635] Out:  1950 [Urine:1900; Drains:50]    Physical Exam:  General:  well-developed and well-nourished female in NAD, sitting in room chair, alert & oriented HEENT: [place]/AT, EOMI, sclera anicteric, hearing grossly intact, no nasal discharge, MMM Respiratory: nonlabored respirations, satting well on RA, symmetrical chest rise Cardiovascular: pulse regular rate & rhythm Abdominal: soft, appropriately TTP, nondistended, surgical incisions c/d/i without signs of exudate/erythema GU: voiding spontaneously Extremities: warm, well-perfused, no c/c/e Neuro: no focal deficits   Data Review: Results for orders placed or performed during the hospital encounter of 07/27/16 (from the past 24 hour(s))  Basic metabolic panel     Status: Abnormal   Collection Time: 07/29/16  4:44 AM  Result Value Ref Range   Sodium 139 135 - 145 mmol/L   Potassium 4.1 3.5 - 5.1 mmol/L   Chloride 108 101 - 111 mmol/L   CO2 26 22 - 32 mmol/L   Glucose, Bld 113 (H) 65 - 99 mg/dL   BUN 15 6 - 20 mg/dL   Creatinine, Ser 0.95 0.44 - 1.00 mg/dL   Calcium 8.4 (L) 8.9 - 10.3 mg/dL   GFR calc non Af Amer >60 >60 mL/min   GFR calc Af Amer >60 >60 mL/min   Anion gap 5 5 - 15  Hemoglobin and hematocrit, blood     Status: Abnormal   Collection Time: 07/29/16  4:44 AM  Result Value Ref Range   Hemoglobin 10.3 (L) 12.0 - 15.0 g/dL   HCT 31.6 (L) 36.0 - 46.0 %    Imaging: None

## 2016-07-29 NOTE — Discharge Summary (Signed)
Physician Discharge Summary  Patient ID: Candace Graham MRN: [id number] DOB/AGE: 46-Nov-1972 46 y.o.  Admit date: 07/27/2016 Discharge date: 07/29/2016  Admission Diagnoses: Left renal mass  Discharge Diagnoses: Left renal mass  Discharged Condition: good  Hospital Course: Saliah Crisp underwent robot assisted lap left partial nephrectomy on 07/27/16 with Dr. Tresa Moore for left renal mass.  The patient tolerated the procedure well, was extubated in the OR and taken to the recovery unit for routine postoperative care. They were then transferred to the floor. Foley was removed POD1 and patient voided spontaneously. JP drain output remained low after Foley removal. JP drain was removed prior to discharge.  By POD2 she had met the usual goals for discharge including spontaneous voiding, ambulating at a preoperative capacity, having pain controlled with PO PRN medications, and tolerating a regular diet.  The patient will follow up with [facility] in 3-4 weeks with Dr. Tresa Moore. They will be discharged with prescriptions for vicodin & stool softeners.   Pathology was not available by time of discharge.   Consults: None  Significant Diagnostic Studies: labs: Cr 0.95. Hb 10.3 on POD2  Treatments: surgery: as noted above  Discharge Exam: Blood pressure (!) 116/53, pulse (!) 101, temperature 99.6 F (37.6 C), temperature source Oral, resp. rate 16, height 5' 7"  (1.702 m), weight 225 lb (102.1 kg), last menstrual period 08/15/2014, SpO2 97 %.  General:  well-developed and well-nourished female in NAD, lying in bed, alert & oriented, pleasant HEENT: [place]/AT, EOMI, sclera anicteric, hearing grossly intact, no nasal discharge, MMM Respiratory: nonlabored respirations, satting well on RA, symmetrical chest rise Cardiovascular: pulse regular rate & rhythm Abdominal: soft, appropriately TTP, nondistended, surgical incisions c/d/i without signs of exudate/erythema Gu: voiding spontaneously,  JP with SS drainage (removed by RN prior to discharge) Extremities: warm, well-perfused, no c/c/e Neuro: no focal deficits  Disposition: 01-Home or Self Care  Discharge Instructions    Activity as tolerated - No restrictions    Complete by:  As directed    Call MD for:    Complete by:  As directed    Temperature >101.5   Call MD for:  persistant nausea and vomiting    Complete by:  As directed    Call MD for:  redness, tenderness, or signs of infection (pain, swelling, redness, odor or green/yellow discharge around incision site)    Complete by:  As directed    Call MD for:  severe uncontrolled pain    Complete by:  As directed    Diet general    Complete by:  As directed    Increase activity slowly    Complete by:  As directed    No wound care    Complete by:  As directed        Medication List    TAKE these medications   HYDROcodone-acetaminophen 5-325 MG tablet Commonly known as:  NORCO Take 1-2 tablets by mouth every 6 (six) hours as needed for moderate pain or severe pain.      Follow-up Information    Alexis Frock, MD.   Specialty:  Urology Why:  We will call to arrange office visit in about 3-4 weeks. Dr. Tresa Moore will call you with pathology results when available.  Contact information: [place] [address] [phone]           Signed: Burnice Logan 07/29/2016, 11:32 AM

## 2016-07-29 NOTE — Progress Notes (Signed)
Patient and her husband state they do not feel comfortable with discharge. The husband states his concern is "fever" and lack of bowel movement. Educated patient and husband at length about temperature within normal range and that she is medically stable for discharge. Dr. Lenord Fellers notified.   Candace Graham. Brigitte Pulse, RN

## 2016-07-30 ENCOUNTER — Encounter (HOSPITAL_COMMUNITY): Payer: Self-pay | Admitting: Urology

## 2016-07-30 LAB — HEMOGLOBIN AND HEMATOCRIT, BLOOD
HCT: 31.4 % — ABNORMAL LOW (ref 36.0–46.0)
Hemoglobin: 10.2 g/dL — ABNORMAL LOW (ref 12.0–15.0)

## 2016-07-30 NOTE — Care Management Note (Signed)
Case Management Note  Patient Details  Name: Candace Graham MRN: [id number] Date of Birth: 09/21/1971  Subjective/Objective:45 y/o f admitted w/renal mass. From home.                    Action/Plan:d/c home no needs or orders.   Expected Discharge Date:                  Expected Discharge Plan:  Home/Self Care  In-House Referral:     Discharge planning Services  CM Consult  Post Acute Care Choice:    Choice offered to:     DME Arranged:    DME Agency:     HH Arranged:    [place] Agency:     Status of Service:  Completed, signed off  If discussed at [employer] of Stay Meetings, dates discussed:    Additional Comments:  Dessa Phi, RN 07/30/2016, 9:58 AM

## 2016-07-30 NOTE — Discharge Summary (Addendum)
Physician Discharge Summary  Patient ID: Candace Graham MRN: [id number] DOB/AGE: [birth year] 45 y.o.  Admit date: 07/27/2016 Discharge date: 07/30/2016  Admission Diagnoses: Left renal mass  Discharge Diagnoses:  Active Problems:   Renal mass   Discharged Condition: good  Hospital Course:  Meka Lewan underwent robot assisted lap left partial nephrectomy on 07/27/16 with Dr. Tresa Moore for left renal mass.  The patient tolerated the procedure well, was extubated in the OR and taken to the recovery unit for routine postoperative care. They were then transferred to the floor. Foley was removed POD1 and patient voided spontaneously. JP drain output remained low after Foley removal. JP drain was removed prior to discharge.  She was initially discharged on POD2 however had increasing pain and it was apparent that she was not taking adequate pain medications. She was also concerned about a low grade temp to 100 on evening of POD1 likely related to atelectasis.   By POD3 she had met the usual goals for discharge including spontaneous voiding, ambulating at a preoperative capacity, having pain controlled with PO PRN medications, and tolerating a regular diet.  The patient will follow up with [facility] in 3-4 weeks with Dr. Tresa Moore. They will be discharged with prescriptions for vicodin & stool softeners.   Pathology was not available by time of discharge.  Consults: None  Significant Diagnostic Studies: labs: Hb stable at 10.2 on POD3  Treatments: surgery: as noted above  Discharge Exam: Blood pressure 137/77, pulse 91, temperature 98.6 F (37 C), temperature source Oral, resp. rate 18, height 5' 7"  (1.702 m), weight 225 lb (102.1 kg), last menstrual period 08/15/2014, SpO2 92 %.  General: well-developed and well-nourished femalein NAD, lying in bed, alert &oriented, pleasant HEENT: [place]/AT, EOMI, sclera anicteric, hearing grossly intact, no nasal discharge,  MMM Respiratory: nonlabored respirations, satting well on RA, symmetrical chest rise Cardiovascular: pulse regular rate & rhythm Abdominal: soft, appropriately TTP, nondistended, surgical incisions c/d/i without signs of exudate/erythema Gu: voiding spontaneously, JP with SS drainage (removed by RN prior to discharge) Extremities: warm, well-perfused, no c/c/e Neuro: no focal deficits  Disposition: 01-Home or Self Care  Discharge Instructions    Activity as tolerated - No restrictions    Complete by:  As directed    Call MD for:    Complete by:  As directed    Temperature >101.5   Call MD for:  persistant nausea and vomiting    Complete by:  As directed    Call MD for:  redness, tenderness, or signs of infection (pain, swelling, redness, odor or green/yellow discharge around incision site)    Complete by:  As directed    Call MD for:  severe uncontrolled pain    Complete by:  As directed    Diet general    Complete by:  As directed    Discharge patient    Complete by:  As directed    Increase activity slowly    Complete by:  As directed    No wound care    Complete by:  As directed        Medication List    TAKE these medications   HYDROcodone-acetaminophen 5-325 MG tablet Commonly known as:  NORCO Take 1-2 tablets by mouth every 6 (six) hours as needed for moderate pain or severe pain.      Follow-up Information    Alexis Frock, MD.   Specialty:  Urology Why:  We will call to arrange office visit in about 3-4 weeks. Dr. Tresa Moore  will call you with pathology results when available.  Contact information: [place] [address] [phone]           Signed: Burnice Logan 07/30/2016, 7:42 AM  I have seen and examined the patient including on 10/16, the day of discharge and agree with plan as per above. OK for DC home after uncomplicated course following partial nephrectomy.   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

## 2016-08-21 ENCOUNTER — Encounter: Payer: Self-pay | Admitting: Internal Medicine

## 2016-08-21 ENCOUNTER — Ambulatory Visit (INDEPENDENT_AMBULATORY_CARE_PROVIDER_SITE_OTHER): Payer: BLUE CROSS/BLUE SHIELD | Admitting: Internal Medicine

## 2016-08-21 VITALS — BP 124/82 | HR 75 | Ht 67.5 in | Wt 224.0 lb

## 2016-08-21 DIAGNOSIS — R918 Other nonspecific abnormal finding of lung field: Secondary | ICD-10-CM

## 2016-08-21 DIAGNOSIS — D4102 Neoplasm of uncertain behavior of left kidney: Secondary | ICD-10-CM | POA: Diagnosis not present

## 2016-08-21 DIAGNOSIS — N2889 Other specified disorders of kidney and ureter: Secondary | ICD-10-CM

## 2016-08-21 NOTE — Progress Notes (Signed)
Subjective:     Patient ID: Candace Graham, female   DOB: 16-May-1971, 44 y.o.   MRN: [id number]  PCP Mauricio Po, FNP  HPI   PCP Mauricio Po, FNP   HPI  IOV 06/27/2016  Chief Complaint  Patient presents with  . Pulmonary Consult    Pt is self referral for lung nodules. Pt is wanting follow up on lung nodules. Pt had  Pt denies SOB, cough.  Pt had lung biopsy in late August     45 year old [place] American female reports that a year ago she had endometrial polyp removed. She continued to have some back pain and saw Dr. Verdia Kuba gastroenterlogist. This resulted in a CT abdomen pelvis 05/15/2016 that showed a 2.5 cm well encapsulated left renal mass. This prompted a urology referral. The CT abdomen lung cut that showed pulmonary nodules which was confirmed on a CT chest 05/18/2016. The largest was a left lower lobe nodule and a even larger right middle lobe nodule 1.81.5 cm. She then saw urology Dr. Diona Fanti was concerned about the possibility of renal cell cancer with pulmonary metastasis although in talking to the patient and review of the radiology reports it was felt that the nodules was low-intermediate probability for pulmonary metastasis because the size of the renal mass is really smaller 2.5 cm. 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 Was again felt that the nodules did not fit in with a pattern of pulmonary metastasis. Then on 06/20/2016 she had a PET scan the pulmonary nodules were below the limit of detection except the right middle lobe lung nodule which was of low uptake. The left renal mass is of high uptake and suggestive of renal cell cancer. Through all this on 06/12/2016 she underwent CT-guided transthoracic needle biopsy that was nondiagnostic showing sclerosis and dense fibrosis and scant-benign lung parenchyma.  Patient is here for further  consideration. She is extremely worried about undergoing repeat biopsy of the lung nodule. She is worried about the complications. She says she found the CT-guided transthoracic needle biopsy painful and does not want to undergo the same procedure again. She believes that these nodules represent an alternative diagnosis such as sarcoidosis especially given her age and African-American ethnicity.  After she left as per Dr. Alexis Frock urology she is going to see tomorrow. He feels the best approach is to do a partial nephrectomy and decide if this mass is renal cell cancer. And even in the setting of renal metastasis and nephrectomy would help improve prognosis. He also believes that the pretest probably ready for the lung nodules being pulmonary metastasis is low.     has a past medical history of Abnormal uterine bleeding; Anemia; Fibroid tumor; and Vaginal delivery (1991, 1999).   reports that she has never smoked. She has never used smokeless tobacco.   IOV 08/21/2016  Chief Complaint  Patient presents with  . Follow-up    Pt denies any changes since last OV. Pt c/o DOE - pt states this is due to recent left partial nephrectomy. Pt denies CP/tightness and f/c/s.      Fu multiple lung nodule in setting of renal left mass. 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 She is doing well post op. She is worried about nodule. Per her urology does not think this is renal CA mets. Last CT chest was august 2017 and  has been 3 months. SHe is worried about ddx.     has a past medical history of Abnormal uterine bleeding; Anemia; Fibroid tumor; Vaginal delivery (1991, 1999); and Varicose veins of left lower extremity.   reports that she has never smoked. She has never used smokeless tobacco.  Past Surgical History:  Procedure Laterality Date  . DILATATION & CURRETTAGE/HYSTEROSCOPY WITH RESECTOCOPE N/A 04/25/2015   Procedure: DILATATION &  CURETTAGE/HYSTEROSCOPY WITH RESECTOCOPE;  Surgeon: Megan Salon, MD;  Location: [facility];  Service: Gynecology;  Laterality: N/A;  . LUNG BIOPSY    . ROBOTIC ASSITED PARTIAL NEPHRECTOMY Left 07/27/2016   Procedure: XI ROBOTIC ASSITED LEFT PARTIAL NEPHRECTOMY;  Surgeon: Alexis Frock, MD;  Location: [facility];  Service: Urology;  Laterality: Left;  . uterine polyp removal      No Known Allergies  Immunization History  Administered Date(s) Administered  . Tdap 02/10/2014    Family History  Problem Relation Age of Onset  . Pancreatic cancer Mother   . Hypertension Mother   . Stroke Mother   . Thyroid disease Mother   . Hypertension Brother   . Hypertension Brother   . Heart disease Father   . Arthritis Father   . Alcohol abuse Maternal Grandmother   . Esophageal cancer Maternal Grandmother   . Drug abuse Maternal Grandfather      Current Outpatient Prescriptions:  .  HYDROcodone-acetaminophen (NORCO) 5-325 MG tablet, Take 1-2 tablets by mouth every 6 (six) hours as needed for moderate pain or severe pain., Disp: 30 tablet, Rfl: 0    Review of Systems     Objective:   Physical Exam  Constitutional: She is oriented to person, place, and time. She appears well-developed and well-nourished. No distress.  HENT:  Head: Normocephalic and atraumatic.  Right Ear: External ear normal.  Left Ear: External ear normal.  Mouth/Throat: Oropharynx is clear and moist. No oropharyngeal exudate.  Eyes: Conjunctivae and EOM are normal. Pupils are equal, round, and reactive to light. Right eye exhibits no discharge. Left eye exhibits no discharge. No scleral icterus.  Neck: Normal range of motion. Neck supple. No JVD present. No tracheal deviation present. No thyromegaly present.  Cardiovascular: Normal rate, regular rhythm, normal heart sounds and intact distal pulses.  Exam reveals no gallop and no friction rub.   No murmur heard. Pulmonary/Chest: Effort normal and breath sounds normal. No  respiratory distress. She has no wheezes. She has no rales. She exhibits no tenderness.  Abdominal: Soft. Bowel sounds are normal. She exhibits no distension and no mass. There is no tenderness. There is no rebound and no guarding.  Scars from robotic surgery - scattered  Musculoskeletal: Normal range of motion. She exhibits no edema or tenderness.  Lymphadenopathy:    She has no cervical adenopathy.  Neurological: She is alert and oriented to person, place, and time. She has normal reflexes. No cranial nerve deficit. She exhibits normal muscle tone. Coordination normal.  Skin: Skin is warm and dry. No rash noted. She is not diaphoretic. No erythema. No pallor.  Psychiatric: She has a normal mood and affect. Her behavior is normal. Judgment and thought content normal.  Vitals reviewed.   Vitals:   08/21/16 1223  BP: 124/82  Pulse: 75  SpO2: 98%  Weight: 224 lb (101.6 kg)  Height: 5' 7.5" (1.715 m)    Estimated body mass index is 34.57 kg/m as calculated from the following:   Height as of this encounter: 5' 7.5" (1.715 m).   Weight  as of this encounter: 224 lb (101.6 kg).      Assessment:       ICD-9-CM ICD-10-CM   1. Multiple lung nodules 793.19 R91.8 CT Super D Chest Wo Contrast  2. Left renal mass 593.9 N28.89    Dr. Brand Males, M.D., [facility].C.P Pulmonary and Critical Care Medicine Staff Physician [facility] Pager: [phone], If no answer or between  15:00h - 7:00h: call [phone]  08/21/2016 12:51 PM       Plan:      Broad ddx but cancer remains in ddx esp given size and renal ca hx. GEt 3 months Super CD CT chest now and decide on ENB v folowup  (> 50% of this 15 min visit spent in face to face counseling or/and coordination of care)   Dr. Brand Males, M.D., [facility].C.P Pulmonary and Critical Care Medicine Staff Physician [facility] Pager: [phone], If  no answer or between  15:00h - 7:00h: call [phone]  08/21/2016 12:51 PM

## 2016-08-21 NOTE — Patient Instructions (Signed)
ICD-9-CM ICD-10-CM   1. Multiple lung nodules 793.19 R91.8   2. Left renal mass 593.9 N28.89    Do  Super-d ct chest Depending on results referral to Dr Lamonte Sakai for bronchoscopy biopsy v followup

## 2016-08-24 ENCOUNTER — Ambulatory Visit (INDEPENDENT_AMBULATORY_CARE_PROVIDER_SITE_OTHER)
Admission: RE | Admit: 2016-08-24 | Discharge: 2016-08-24 | Disposition: A | Discharge status: Home / Self Care | Payer: BLUE CROSS/BLUE SHIELD | Source: Ambulatory Visit | Attending: Internal Medicine | Admitting: Internal Medicine

## 2016-08-24 ENCOUNTER — Inpatient Hospital Stay: Admission: RE | Admit: 2016-08-24 | Payer: BLUE CROSS/BLUE SHIELD | Source: Ambulatory Visit

## 2016-08-24 DIAGNOSIS — R918 Other nonspecific abnormal finding of lung field: Secondary | ICD-10-CM | POA: Diagnosis not present

## 2016-08-28 ENCOUNTER — Telehealth: Payer: Self-pay | Admitting: Internal Medicine

## 2016-08-28 NOTE — Telephone Encounter (Signed)
Nodules are still there x 3 months. Refer Baltazar Apo for ENB eval  Thanks  Dr. Brand Males, M.D., [facility].C.P Pulmonary and Critical Care Medicine Staff Physician [facility] Pager: [phone], If no answer or between  15:00h - 7:00h: call [phone]  08/28/2016 6:26 AM     Ct Super D Chest Wo Contrast  Result Date: 08/24/2016 CLINICAL DATA:  Followup pulmonary nodules. EXAM: CT CHEST WITHOUT CONTRAST TECHNIQUE: Multidetector CT imaging of the chest was performed using thin slice collimation for electromagnetic bronchoscopy planning purposes, without intravenous contrast. COMPARISON:  PET-CT scan 06/20/2016 comma chest CT 05/18/2016 FINDINGS: Cardiovascular: No significant vascular findings. Normal heart size. No pericardial effusion. Mediastinum/Nodes: Numerous small paratracheal lymph nodes are unchanged including 8 mm RIGHT lower paratracheal node. No adenopathy no pericardial fluid. Esophagus normal. Lungs/Pleura: Bilateral round pulmonary nodules of varying sizes not changed in number size compared to prior. Example nodule RIGHT middle lobe measures 18 mm (image 30, series 3) compared 18 mm. Adjacent 12 mm nodule compares to 12 mm. Within the LEFT lower lobe example nodule measures 9 mm (image 36, series 3) compared to 8 mm. In the lingula 8 mm nodule (image 23)unchanged from 8 mm. Upper Abdomen: No metastatic disease in the upper abdomen Low-density lesion the T1 vertebral body is most consists with benign hemangioma (image 9, series 2). IMPRESSION: 1. Multiple round pulmonary nodules assumed to represent renal cell carcinoma metastasis are not changed in size or number compared to prior. 2. No pathologic enlarged mediastinal lymph nodes. Electronically Signed   By: Suzy Bouchard M.D.   On: 08/24/2016 15:38

## 2016-08-29 NOTE — Telephone Encounter (Signed)
Patient calling for results - she can be reached at [phone] -pr

## 2016-08-29 NOTE — Telephone Encounter (Signed)
Based on the scan and her visits with MR I believe we can go ahead and schedule ENB without an OV with me. Please call her and ask her if this is ok or whether she would like to see me first. If she wants to schedule procedure then let me know and I will make the referral.

## 2016-08-29 NOTE — Telephone Encounter (Signed)
Called and spoke to pt. Informed her of the results and recs per MR. Pt verbalized understanding and denied any further questions or concerns at this time.   Dr. Lamonte Sakai please advise if ok to schedule or if needing additional testing prior to appt. Thanks.

## 2016-08-30 NOTE — Telephone Encounter (Signed)
Called and spoke to pt. Pt state she would rather have OV before ENB. Spoke with Ria Comment and appt scheduled with RB on 09/04/16 at 9 (pt to arrive at 35). Nothing further needed at this time.

## 2016-09-04 ENCOUNTER — Ambulatory Visit (INDEPENDENT_AMBULATORY_CARE_PROVIDER_SITE_OTHER): Payer: BLUE CROSS/BLUE SHIELD | Admitting: Emergency Medicine

## 2016-09-04 ENCOUNTER — Encounter: Payer: Self-pay | Admitting: Emergency Medicine

## 2016-09-04 VITALS — BP 110/76 | HR 66 | Ht 67.5 in | Wt 220.0 lb

## 2016-09-04 DIAGNOSIS — R911 Solitary pulmonary nodule: Secondary | ICD-10-CM | POA: Diagnosis not present

## 2016-09-04 DIAGNOSIS — R918 Other nonspecific abnormal finding of lung field: Secondary | ICD-10-CM

## 2016-09-04 NOTE — Patient Instructions (Signed)
We discussed your CT scan and PET scan today. We will need to decide whether to perform biopsies of your lungs via bronchoscopy versus following up with a repeat Ct scan of your chest in February 2018.  We will not schedule any procedure for now. Please call us to let us know which approach you would like to take.

## 2016-09-04 NOTE — Progress Notes (Signed)
Subjective:    Patient ID: Candace Graham, female    DOB: 1971/06/24, 45 y.o.   MRN: [id number]  HPI 45 year old woman never smoker who has been diagnosed with L renal cell carcinoma. She has been seen by Dr Chase Caller for B rounded pulmonary nodules, one of which has mild hypermetabolic on PET from [id number], remains present on CT chest from 08/24/16. She is here today to discuss possible biopsy. She had a L partial renal resection 07/27/16 with clear margins for the renal cell CA. Also underwent lung TTNA on 06/12/16 >> non-diagnostic.   She reports that she is asymptomatic. She does have post-nasal gtt and some cough.   Pt has a lot of questions regarding all of the test results to date, the pre-test probability of malignancy, the risks / benefits of FOB + biopsies.   Review of Systems As above.  Past Medical History:  Diagnosis Date  . Abnormal uterine bleeding    Heavy menstrual cycles  . Anemia   . Fibroid tumor   . Vaginal delivery 1991, 1999  . Varicose veins of left lower extremity      Family History  Problem Relation Age of Onset  . Pancreatic cancer Mother   . Hypertension Mother   . Stroke Mother   . Thyroid disease Mother   . Hypertension Brother   . Hypertension Brother   . Heart disease Father   . Arthritis Father   . Alcohol abuse Maternal Grandmother   . Esophageal cancer Maternal Grandmother   . Drug abuse Maternal Grandfather      Social History   Social History  . Marital status: Single    Spouse name: N/A  . Number of children: 2  . Years of education: 16   Occupational History  . Not on file.   Social History Main Topics  . Smoking status: Never Smoker  . Smokeless tobacco: Never Used  . Alcohol use No  . Drug use: No  . Sexual activity: Yes    Partners: Male    Birth control/ protection: None   Other Topics Concern  . Not on file   Social History Narrative   Fun: Travel, working with sons.   Denies religious beliefs effecting  health care.    Denies abuse and feels safe at home.      No Known Allergies   Outpatient Medications Prior to Visit  Medication Sig Dispense Refill  . HYDROcodone-acetaminophen (NORCO) 5-325 MG tablet Take 1-2 tablets by mouth every 6 (six) hours as needed for moderate pain or severe pain. 30 tablet 0   No facility-administered medications prior to visit.         Objective:   Physical Exam Vitals:   09/04/16 0914  BP: 110/76  Pulse: 66  SpO2: 98%  Weight: 220 lb (99.8 kg)  Height: 5' 7.5" (1.715 m)   Gen: Pleasant, well-nourished, in no distress,  normal affect  ENT: No lesions,  mouth clear,  oropharynx clear, no postnasal drip  Neck: No JVD, no TMG, no carotid bruits  Lungs: No use of accessory muscles, no dullness to percussion, clear without rales or rhonchi  Cardiovascular: RRR, heart sounds normal, no murmur or gallops, no peripheral edema  Musculoskeletal: No deformities, no cyanosis or clubbing  Neuro: alert, non focal  Skin: Warm, no lesions or rashes   08/24/16 --  EXAM: CT CHEST WITHOUT CONTRAST  TECHNIQUE: Multidetector CT imaging of the chest was performed using thin slice collimation for electromagnetic bronchoscopy planning purposes,  without intravenous contrast.  COMPARISON:  PET-CT scan 06/20/2016 comma chest CT 05/18/2016  FINDINGS: Cardiovascular: No significant vascular findings. Normal heart size. No pericardial effusion.  Mediastinum/Nodes: Numerous small paratracheal lymph nodes are unchanged including 8 mm RIGHT lower paratracheal node. No adenopathy no pericardial fluid. Esophagus normal.  Lungs/Pleura: Bilateral round pulmonary nodules of varying sizes not changed in number size compared to prior. Example nodule RIGHT middle lobe measures 18 mm (image 30, series 3) compared 18 mm. Adjacent 12 mm nodule compares to 12 mm.  Within the LEFT lower lobe example nodule measures 9 mm (image 36, series 3) compared to 8 mm. In  the lingula 8 mm nodule (image 23)unchanged from 8 mm.  Upper Abdomen: No metastatic disease in the upper abdomen  Low-density lesion the T1 vertebral body is most consists with benign hemangioma (image 9, series 2).  IMPRESSION: 1. Multiple round pulmonary nodules assumed to represent renal cell carcinoma metastasis are not changed in size or number compared to prior. 2. No pathologic enlarged mediastinal lymph nodes.     Assessment & Plan:  Multiple lung nodules I discussed the CT scan, PET scan, prior biopsy results with the patient in detail. She has significant concern that she hasn't been able to get a tissue diagnosis to date. I expressed understanding I also explained that no test is 100% sensitive including bronchoscopy. I told her that I do have some significant concern that the rounded nodules to represent malignancy. I would like for her to think about 2 possible options, either bronchoscopy now vs  CT scan of her chest in February 2018 to look for interval change. She will let me know which route she would like to take.   Baltazar Apo, MD, PhD 09/04/2016, 10:10 AM [facility] [phone] or if no answer [phone]

## 2016-09-04 NOTE — Assessment & Plan Note (Signed)
I discussed the CT scan, PET scan, prior biopsy results with the patient in detail. She has significant concern that she hasn't been able to get a tissue diagnosis to date. I expressed understanding I also explained that no test is 100% sensitive including bronchoscopy. I told her that I do have some significant concern that the rounded nodules to represent malignancy. I would like for her to think about 2 possible options, either bronchoscopy now vs  CT scan of her chest in February 2018 to look for interval change. She will let me know which route she would like to take.

## 2016-09-04 NOTE — Addendum Note (Signed)
Addended by: Desmond Dike C on: 09/04/2016 10:33 AM   Modules accepted: Orders

## 2016-09-11 ENCOUNTER — Other Ambulatory Visit (HOSPITAL_COMMUNITY): Payer: Self-pay | Admitting: *Deleted

## 2016-09-11 ENCOUNTER — Encounter (HOSPITAL_COMMUNITY): Payer: Self-pay

## 2016-09-11 ENCOUNTER — Encounter (HOSPITAL_COMMUNITY)
Admission: RE | Admit: 2016-09-11 | Discharge: 2016-09-11 | Disposition: A | Discharge status: Home / Self Care | Payer: BLUE CROSS/BLUE SHIELD | Source: Ambulatory Visit | Attending: Emergency Medicine | Admitting: Emergency Medicine

## 2016-09-11 ENCOUNTER — Telehealth: Payer: Self-pay | Admitting: Emergency Medicine

## 2016-09-11 DIAGNOSIS — R918 Other nonspecific abnormal finding of lung field: Secondary | ICD-10-CM | POA: Diagnosis not present

## 2016-09-11 DIAGNOSIS — D649 Anemia, unspecified: Secondary | ICD-10-CM | POA: Diagnosis not present

## 2016-09-11 DIAGNOSIS — Z800 Family history of malignant neoplasm of digestive organs: Secondary | ICD-10-CM | POA: Diagnosis not present

## 2016-09-11 DIAGNOSIS — Z85528 Personal history of other malignant neoplasm of kidney: Secondary | ICD-10-CM | POA: Diagnosis not present

## 2016-09-11 DIAGNOSIS — Z9889 Other specified postprocedural states: Secondary | ICD-10-CM

## 2016-09-11 DIAGNOSIS — I739 Peripheral vascular disease, unspecified: Secondary | ICD-10-CM | POA: Diagnosis not present

## 2016-09-11 DIAGNOSIS — J984 Other disorders of lung: Secondary | ICD-10-CM | POA: Diagnosis not present

## 2016-09-11 LAB — CBC
HCT: 37.7 % (ref 36.0–46.0)
Hemoglobin: 12 g/dL (ref 12.0–15.0)
MCH: 24.8 pg — ABNORMAL LOW (ref 26.0–34.0)
MCHC: 31.8 g/dL (ref 30.0–36.0)
MCV: 78.1 fL (ref 78.0–100.0)
Platelets: 317 10*3/uL (ref 150–400)
RBC: 4.83 MIL/uL (ref 3.87–5.11)
RDW: 14.5 % (ref 11.5–15.5)
WBC: 4.7 10*3/uL (ref 4.0–10.5)

## 2016-09-11 LAB — PROTIME-INR
INR: 1.07
Prothrombin Time: 14 seconds (ref 11.4–15.2)

## 2016-09-11 LAB — APTT: aPTT: 34 seconds (ref 24–36)

## 2016-09-11 NOTE — Telephone Encounter (Signed)
lmtcb for Candace Graham. 

## 2016-09-11 NOTE — Telephone Encounter (Signed)
Clarene Critchley called back and states they are needing the pre-op orders for pt for her ENB on 11.29.17.   Dr. Lamonte Sakai please advise. Thanks.

## 2016-09-11 NOTE — Pre-Procedure Instructions (Signed)
Adriona Woolbert  09/11/2016    Your procedure is scheduled on Wednesday, September 12, 2016 at 10:30 AM.   Report to [facility] Entrance "A" Admitting Office at 8:30 AM.   Call this number if you have problems the morning of surgery: [phone]   Remember:  Do not eat food or drink liquids after midnight tonight.    Do not wear jewelry, make-up or nail polish.  Do not wear lotions, powders, or perfumes.  Do not shave 48 hours prior to surgery.    Do not bring valuables to the hospital.  [facility] is not responsible for any belongings or valuables.  Contacts, dentures or bridgework may not be worn into surgery.  Leave your suitcase in the car.  After surgery it may be brought to your room.  For patients admitted to the hospital, discharge time will be determined by your treatment team.  Patients discharged the day of surgery will not be allowed to drive home.   Special instructions: [facility] - Preparing for Surgery  Before surgery, you can play an important role.  Because skin is not sterile, your skin needs to be as free of germs as possible.  You can reduce the number of germs on you skin by washing with CHG (chlorahexidine gluconate) soap before surgery.  CHG is an antiseptic cleaner which kills germs and bonds with the skin to continue killing germs even after washing.  Please DO NOT use if you have an allergy to CHG or antibacterial soaps.  If your skin becomes reddened/irritated stop using the CHG and inform your nurse when you arrive at Short Stay.  Do not shave (including legs and underarms) for at least 48 hours prior to the first CHG shower.  You may shave your face.  Please follow these instructions carefully:   1.  Shower with CHG Soap the night before surgery and the                    morning of Surgery.  2.  If you choose to wash your hair, wash your hair first as usual with your       normal shampoo.  3.  After you shampoo, rinse your hair and  body thoroughly to remove the shampoo.  4.  Use CHG as you would any other liquid soap.  You can apply chg directly       to the skin and wash gently with scrungie or a clean washcloth.  5.  Apply the CHG Soap to your body ONLY FROM THE NECK DOWN.        Do not use on open wounds or open sores.  Avoid contact with your eyes, ears, mouth and genitals (private parts).  Wash genitals (private parts) with your normal soap.  6.  Wash thoroughly, paying special attention to the area where your surgery        will be performed.  7.  Thoroughly rinse your body with warm water from the neck down.  8.  DO NOT shower/wash with your normal soap after using and rinsing off       the CHG Soap.  9.  Pat yourself dry with a clean towel.            10.  Wear clean pajamas.            11.  Place clean sheets on your bed the night of your first shower and do not  sleep with pets.  Day of Surgery  Do not apply any lotions the morning of surgery.  Please wear clean clothes to the hospital.   Please read over the fact sheets that you were given.

## 2016-09-11 NOTE — Telephone Encounter (Signed)
done

## 2016-09-12 ENCOUNTER — Ambulatory Visit (HOSPITAL_COMMUNITY): Payer: BLUE CROSS/BLUE SHIELD | Admitting: Certified Registered Nurse Anesthetist

## 2016-09-12 ENCOUNTER — Ambulatory Visit (HOSPITAL_COMMUNITY): Payer: BLUE CROSS/BLUE SHIELD

## 2016-09-12 ENCOUNTER — Encounter (HOSPITAL_COMMUNITY)
Admission: RE | Disposition: A | Discharge status: Home / Self Care | Payer: Self-pay | Source: Ambulatory Visit | Attending: Emergency Medicine

## 2016-09-12 ENCOUNTER — Encounter (HOSPITAL_COMMUNITY): Payer: Self-pay | Admitting: Certified Registered Nurse Anesthetist

## 2016-09-12 ENCOUNTER — Ambulatory Visit (HOSPITAL_COMMUNITY)
Admission: RE | Admit: 2016-09-12 | Discharge: 2016-09-12 | Disposition: A | Discharge status: Home / Self Care | Payer: BLUE CROSS/BLUE SHIELD | Source: Ambulatory Visit | Attending: Emergency Medicine | Admitting: Emergency Medicine

## 2016-09-12 DIAGNOSIS — D649 Anemia, unspecified: Secondary | ICD-10-CM | POA: Insufficient documentation

## 2016-09-12 DIAGNOSIS — Z85528 Personal history of other malignant neoplasm of kidney: Secondary | ICD-10-CM | POA: Insufficient documentation

## 2016-09-12 DIAGNOSIS — Z800 Family history of malignant neoplasm of digestive organs: Secondary | ICD-10-CM | POA: Diagnosis not present

## 2016-09-12 DIAGNOSIS — J984 Other disorders of lung: Secondary | ICD-10-CM | POA: Insufficient documentation

## 2016-09-12 DIAGNOSIS — N2889 Other specified disorders of kidney and ureter: Secondary | ICD-10-CM | POA: Diagnosis not present

## 2016-09-12 DIAGNOSIS — D259 Leiomyoma of uterus, unspecified: Secondary | ICD-10-CM | POA: Diagnosis not present

## 2016-09-12 DIAGNOSIS — R911 Solitary pulmonary nodule: Secondary | ICD-10-CM | POA: Diagnosis not present

## 2016-09-12 DIAGNOSIS — I739 Peripheral vascular disease, unspecified: Secondary | ICD-10-CM | POA: Diagnosis not present

## 2016-09-12 DIAGNOSIS — D509 Iron deficiency anemia, unspecified: Secondary | ICD-10-CM | POA: Diagnosis not present

## 2016-09-12 DIAGNOSIS — Z9889 Other specified postprocedural states: Secondary | ICD-10-CM

## 2016-09-12 DIAGNOSIS — R846 Abnormal cytological findings in specimens from respiratory organs and thorax: Secondary | ICD-10-CM | POA: Diagnosis not present

## 2016-09-12 DIAGNOSIS — R918 Other nonspecific abnormal finding of lung field: Secondary | ICD-10-CM | POA: Diagnosis not present

## 2016-09-12 DIAGNOSIS — Z419 Encounter for procedure for purposes other than remedying health state, unspecified: Secondary | ICD-10-CM

## 2016-09-12 HISTORY — PX: VIDEO BRONCHOSCOPY WITH ENDOBRONCHIAL NAVIGATION: SHX6175

## 2016-09-12 LAB — BODY FLUID CELL COUNT WITH DIFFERENTIAL: Total Nucleated Cell Count, Fluid: 33 cu mm (ref 0–1000)

## 2016-09-12 LAB — BASIC METABOLIC PANEL
Anion gap: 9 (ref 5–15)
BUN: 13 mg/dL (ref 6–20)
CO2: 24 mmol/L (ref 22–32)
Calcium: 9.5 mg/dL (ref 8.9–10.3)
Chloride: 108 mmol/L (ref 101–111)
Creatinine, Ser: 0.8 mg/dL (ref 0.44–1.00)
GFR calc Af Amer: >60 mL/min (ref >60)
GFR calc non Af Amer: >60 mL/min (ref >60)
Glucose, Bld: 78 mg/dL (ref 65–99)
Potassium: 3.9 mmol/L (ref 3.5–5.1)
Sodium: 141 mmol/L (ref 135–145)

## 2016-09-12 SURGERY — VIDEO BRONCHOSCOPY WITH ENDOBRONCHIAL NAVIGATION
Anesthesia: General

## 2016-09-12 MED ORDER — PROPOFOL 10 MG/ML IV BOLUS
INTRAVENOUS | Status: DC | PRN
  Administered 2016-09-12: 200 mg via INTRAVENOUS

## 2016-09-12 MED ORDER — ROCURONIUM BROMIDE 10 MG/ML (PF) SYRINGE
PREFILLED_SYRINGE | INTRAVENOUS | Status: AC
  Filled 2016-09-12: qty 10

## 2016-09-12 MED ORDER — FENTANYL CITRATE (PF) 100 MCG/2ML IJ SOLN
INTRAMUSCULAR | Status: AC
  Filled 2016-09-12: qty 2

## 2016-09-12 MED ORDER — ONDANSETRON HCL 4 MG/2ML IJ SOLN
INTRAMUSCULAR | Status: DC | PRN
  Administered 2016-09-12: 4 mg via INTRAVENOUS

## 2016-09-12 MED ORDER — SUGAMMADEX SODIUM 200 MG/2ML IV SOLN
INTRAVENOUS | Status: DC | PRN
  Administered 2016-09-12: 201.4 mg via INTRAVENOUS

## 2016-09-12 MED ORDER — PROPOFOL 10 MG/ML IV BOLUS
INTRAVENOUS | Status: AC
  Filled 2016-09-12: qty 20

## 2016-09-12 MED ORDER — 0.9 % SODIUM CHLORIDE (POUR BTL) OPTIME
TOPICAL | Status: DC | PRN
  Administered 2016-09-12: 1000 mL

## 2016-09-12 MED ORDER — LIDOCAINE 2% (20 MG/ML) 5 ML SYRINGE
INTRAMUSCULAR | Status: AC
  Filled 2016-09-12: qty 5

## 2016-09-12 MED ORDER — DEXAMETHASONE SODIUM PHOSPHATE 10 MG/ML IJ SOLN
INTRAMUSCULAR | Status: AC
  Filled 2016-09-12: qty 1

## 2016-09-12 MED ORDER — PROMETHAZINE HCL 25 MG/ML IJ SOLN: 6.2500 mg | INTRAMUSCULAR | Status: DC | PRN

## 2016-09-12 MED ORDER — LIDOCAINE HCL (CARDIAC) 20 MG/ML IV SOLN
INTRAVENOUS | Status: DC | PRN
  Administered 2016-09-12: 100 mg via INTRAVENOUS

## 2016-09-12 MED ORDER — ONDANSETRON HCL 4 MG/2ML IJ SOLN
INTRAMUSCULAR | Status: AC
Start: 2016-09-12 — End: 2016-09-12
  Filled 2016-09-12: qty 2

## 2016-09-12 MED ORDER — HYDROMORPHONE HCL 1 MG/ML IJ SOLN: 0.2500 mg | INTRAMUSCULAR | Status: DC | PRN

## 2016-09-12 MED ORDER — MIDAZOLAM HCL 2 MG/2ML IJ SOLN
INTRAMUSCULAR | Status: AC
  Filled 2016-09-12: qty 2

## 2016-09-12 MED ORDER — DEXAMETHASONE SODIUM PHOSPHATE 10 MG/ML IJ SOLN
INTRAMUSCULAR | Status: DC | PRN
  Administered 2016-09-12: 5 mg via INTRAVENOUS

## 2016-09-12 MED ORDER — ROCURONIUM BROMIDE 100 MG/10ML IV SOLN
INTRAVENOUS | Status: DC | PRN
  Administered 2016-09-12: 40 mg via INTRAVENOUS
  Administered 2016-09-12: 20 mg via INTRAVENOUS

## 2016-09-12 MED ORDER — SUGAMMADEX SODIUM 200 MG/2ML IV SOLN
INTRAVENOUS | Status: AC
  Filled 2016-09-12: qty 2

## 2016-09-12 MED ORDER — LACTATED RINGERS IV SOLN
INTRAVENOUS | Status: DC | PRN
  Administered 2016-09-12 (×2): via INTRAVENOUS

## 2016-09-12 MED ORDER — LACTATED RINGERS IV SOLN
INTRAVENOUS | Status: DC
  Administered 2016-09-12: 50 mL/h via INTRAVENOUS

## 2016-09-12 MED ORDER — FENTANYL CITRATE (PF) 100 MCG/2ML IJ SOLN
INTRAMUSCULAR | Status: DC | PRN
  Administered 2016-09-12: 100 ug via INTRAVENOUS
  Administered 2016-09-12 (×2): 50 ug via INTRAVENOUS

## 2016-09-12 MED ORDER — MIDAZOLAM HCL 5 MG/5ML IJ SOLN
INTRAMUSCULAR | Status: DC | PRN
  Administered 2016-09-12 (×2): 1 mg via INTRAVENOUS

## 2016-09-12 SURGICAL SUPPLY — 36 items
ADAPTER BRONCH F/PENTAX (ADAPTER) ×2 IMPLANT
BRUSH CYTOL CELLEBRITY 1.5X140 (MISCELLANEOUS) ×2 IMPLANT
BRUSH SUPERTRAX BIOPSY (INSTRUMENTS) IMPLANT
BRUSH SUPERTRAX NDL-TIP CYTO (INSTRUMENTS) ×2 IMPLANT
CANISTER SUCTION 2500CC (MISCELLANEOUS) ×2 IMPLANT
CHANNEL WORK EXTEND EDGE 180 (KITS) IMPLANT
CHANNEL WORK EXTEND EDGE 45 (KITS) IMPLANT
CHANNEL WORK EXTEND EDGE 90 (KITS) IMPLANT
CONT SPEC 4OZ CLIKSEAL STRL BL (MISCELLANEOUS) ×2 IMPLANT
COVER TABLE BACK 60X90 (DRAPES) ×2 IMPLANT
FILTER STRAW FLUID ASPIR (MISCELLANEOUS) IMPLANT
FORCEPS BIOP SUPERTRX PREMAR (INSTRUMENTS) ×2 IMPLANT
GAUZE SPONGE 4X4 12PLY STRL (GAUZE/BANDAGES/DRESSINGS) ×2 IMPLANT
GLOVE BIO SURGEON STRL SZ7.5 (GLOVE) ×4 IMPLANT
GOWN STRL REUS W/ TWL LRG LVL3 (GOWN DISPOSABLE) ×1 IMPLANT
GOWN STRL REUS W/TWL LRG LVL3 (GOWN DISPOSABLE) ×1
KIT CLEAN ENDO COMPLIANCE (KITS) ×2 IMPLANT
KIT LOCATABLE GUIDE (CANNULA) IMPLANT
KIT MARKER FIDUCIAL DELIVERY (KITS) IMPLANT
KIT PROCEDURE EDGE 180 (KITS) ×2 IMPLANT
KIT PROCEDURE EDGE 45 (KITS) IMPLANT
KIT PROCEDURE EDGE 90 (KITS) IMPLANT
KIT ROOM TURNOVER OR (KITS) ×2 IMPLANT
MARKER SKIN DUAL TIP RULER LAB (MISCELLANEOUS) ×2 IMPLANT
NEEDLE SUPERTRX PREMARK BIOPSY (NEEDLE) ×2 IMPLANT
NS IRRIG 1000ML POUR BTL (IV SOLUTION) ×2 IMPLANT
OIL SILICONE PENTAX (PARTS (SERVICE/REPAIRS)) ×2 IMPLANT
PAD ARMBOARD 7.5X6 YLW CONV (MISCELLANEOUS) ×4 IMPLANT
PATCHES PATIENT (LABEL) ×2 IMPLANT
SYR 20CC LL (SYRINGE) ×2 IMPLANT
SYR 20ML ECCENTRIC (SYRINGE) ×2 IMPLANT
SYR 50ML SLIP (SYRINGE) ×2 IMPLANT
TOWEL OR 17X24 6PK STRL BLUE (TOWEL DISPOSABLE) ×2 IMPLANT
TRAP SPECIMEN MUCOUS 40CC (MISCELLANEOUS) IMPLANT
TUBE CONNECTING 20X1/4 (TUBING) ×2 IMPLANT
WATER STERILE IRR 1000ML POUR (IV SOLUTION) ×2 IMPLANT

## 2016-09-12 NOTE — Interval H&P Note (Signed)
PCCM Interval Note  PT presents today for further evaluation of her pulmonary nodules bilaterally. She is 75 with newly dx renal cell CA s/p resection. The nodules are small, with only the dominant RLL nodule showing mild hypermetabolism. Suspicion still exists for metastatic disease. She reports no new problems or issues. Her mother and son are here with her.   There were no vitals filed for this visit.  Gen: Pleasant, well-nourished, in no distress,  normal affect  ENT: No lesions,  mouth clear,  oropharynx clear, mild postnasal drip  Neck: No JVD  Lungs: No use of accessory muscles, clear without rales or rhonchi  Cardiovascular: RRR, heart sounds normal, no murmur or gallops, no peripheral edema  Musculoskeletal: No deformities, no cyanosis or clubbing  Neuro: alert, non focal  Skin: Warm, no lesions or rashes    Recent Labs Lab 09/11/16 1315  HGB 12.0  HCT 37.7  WBC 4.7  PLT 317    Recent Labs Lab 09/11/16 1315  INR 1.07     CT chest 08/24/16 --  EXAM: CT CHEST WITHOUT CONTRAST  TECHNIQUE: Multidetector CT imaging of the chest was performed using thin slice collimation for electromagnetic bronchoscopy planning purposes, without intravenous contrast.  COMPARISON:  PET-CT scan 06/20/2016 comma chest CT 05/18/2016  FINDINGS: Cardiovascular: No significant vascular findings. Normal heart size. No pericardial effusion.  Mediastinum/Nodes: Numerous small paratracheal lymph nodes are unchanged including 8 mm RIGHT lower paratracheal node. No adenopathy no pericardial fluid. Esophagus normal.  Lungs/Pleura: Bilateral round pulmonary nodules of varying sizes not changed in number size compared to prior. Example nodule RIGHT middle lobe measures 18 mm (image 30, series 3) compared 18 mm. Adjacent 12 mm nodule compares to 12 mm.  Within the LEFT lower lobe example nodule measures 9 mm (image 36, series 3) compared to 8 mm. In the lingula 8 mm nodule  (image 23)unchanged from 8 mm.  Upper Abdomen: No metastatic disease in the upper abdomen  Low-density lesion the T1 vertebral body is most consists with benign hemangioma (image 9, series 2).  IMPRESSION: 1. Multiple round pulmonary nodules assumed to represent renal cell carcinoma metastasis are not changed in size or number compared to prior. 2. No pathologic enlarged mediastinal lymph nodes.  Plans:  We will plan for navigational FOB under general anesthesia, TBBx's, brushings, BAL. Concentrate on the dominant RLL lesion if it can be targeted. Procedure explained to the patient, all questions answered. She understands the risks and benefits and elects to proceed.   Baltazar Apo, MD, PhD 09/12/2016, 9:28 AM [facility] [phone] or if no answer [phone]

## 2016-09-12 NOTE — H&P (View-Only) (Signed)
Subjective:    Patient ID: Candace Graham, female    DOB: 12-26-1970, 45 y.o.   MRN: [id number]  HPI 45 year old woman never smoker who has been diagnosed with L renal cell carcinoma. She has been seen by Dr Chase Caller for B rounded pulmonary nodules, one of which has mild hypermetabolic on PET from [id number], remains present on CT chest from 08/24/16. She is here today to discuss possible biopsy. She had a L partial renal resection 07/27/16 with clear margins for the renal cell CA. Also underwent lung TTNA on 06/12/16 >> non-diagnostic.   She reports that she is asymptomatic. She does have post-nasal gtt and some cough.   Pt has a lot of questions regarding all of the test results to date, the pre-test probability of malignancy, the risks / benefits of FOB + biopsies.   Review of Systems As above.  Past Medical History:  Diagnosis Date  . Abnormal uterine bleeding    Heavy menstrual cycles  . Anemia   . Fibroid tumor   . Vaginal delivery 1991, 1999  . Varicose veins of left lower extremity      Family History  Problem Relation Age of Onset  . Pancreatic cancer Mother   . Hypertension Mother   . Stroke Mother   . Thyroid disease Mother   . Hypertension Brother   . Hypertension Brother   . Heart disease Father   . Arthritis Father   . Alcohol abuse Maternal Grandmother   . Esophageal cancer Maternal Grandmother   . Drug abuse Maternal Grandfather      Social History   Social History  . Marital status: Single    Spouse name: N/A  . Number of children: 2  . Years of education: 16   Occupational History  . Not on file.   Social History Main Topics  . Smoking status: Never Smoker  . Smokeless tobacco: Never Used  . Alcohol use No  . Drug use: No  . Sexual activity: Yes    Partners: Male    Birth control/ protection: None   Other Topics Concern  . Not on file   Social History Narrative   Fun: Travel, working with sons.   Denies religious beliefs effecting  health care.    Denies abuse and feels safe at home.      No Known Allergies   Outpatient Medications Prior to Visit  Medication Sig Dispense Refill  . HYDROcodone-acetaminophen (NORCO) 5-325 MG tablet Take 1-2 tablets by mouth every 6 (six) hours as needed for moderate pain or severe pain. 30 tablet 0   No facility-administered medications prior to visit.         Objective:   Physical Exam Vitals:   09/04/16 0914  BP: 110/76  Pulse: 66  SpO2: 98%  Weight: 220 lb (99.8 kg)  Height: 5' 7.5" (1.715 m)   Gen: Pleasant, well-nourished, in no distress,  normal affect  ENT: No lesions,  mouth clear,  oropharynx clear, no postnasal drip  Neck: No JVD, no TMG, no carotid bruits  Lungs: No use of accessory muscles, no dullness to percussion, clear without rales or rhonchi  Cardiovascular: RRR, heart sounds normal, no murmur or gallops, no peripheral edema  Musculoskeletal: No deformities, no cyanosis or clubbing  Neuro: alert, non focal  Skin: Warm, no lesions or rashes   08/24/16 --  EXAM: CT CHEST WITHOUT CONTRAST  TECHNIQUE: Multidetector CT imaging of the chest was performed using thin slice collimation for electromagnetic bronchoscopy planning purposes,  without intravenous contrast.  COMPARISON:  PET-CT scan 06/20/2016 comma chest CT 05/18/2016  FINDINGS: Cardiovascular: No significant vascular findings. Normal heart size. No pericardial effusion.  Mediastinum/Nodes: Numerous small paratracheal lymph nodes are unchanged including 8 mm RIGHT lower paratracheal node. No adenopathy no pericardial fluid. Esophagus normal.  Lungs/Pleura: Bilateral round pulmonary nodules of varying sizes not changed in number size compared to prior. Example nodule RIGHT middle lobe measures 18 mm (image 30, series 3) compared 18 mm. Adjacent 12 mm nodule compares to 12 mm.  Within the LEFT lower lobe example nodule measures 9 mm (image 36, series 3) compared to 8 mm. In  the lingula 8 mm nodule (image 23)unchanged from 8 mm.  Upper Abdomen: No metastatic disease in the upper abdomen  Low-density lesion the T1 vertebral body is most consists with benign hemangioma (image 9, series 2).  IMPRESSION: 1. Multiple round pulmonary nodules assumed to represent renal cell carcinoma metastasis are not changed in size or number compared to prior. 2. No pathologic enlarged mediastinal lymph nodes.     Assessment & Plan:  Multiple lung nodules I discussed the CT scan, PET scan, prior biopsy results with the patient in detail. She has significant concern that she hasn't been able to get a tissue diagnosis to date. I expressed understanding I also explained that no test is 100% sensitive including bronchoscopy. I told her that I do have some significant concern that the rounded nodules to represent malignancy. I would like for her to think about 2 possible options, either bronchoscopy now vs  CT scan of her chest in February 2018 to look for interval change. She will let me know which route she would like to take.   Baltazar Apo, MD, PhD 09/04/2016, 10:10 AM [facility] [phone] or if no answer [phone]

## 2016-09-12 NOTE — Discharge Instructions (Signed)
Flexible Bronchoscopy, Care After These instructions give you information on caring for yourself after your procedure. Your doctor may also give you more specific instructions. Call your doctor if you have any problems or questions after your procedure. Follow these instructions at home:  Do not eat or drink anything for 2 hours after your procedure. If you try to eat or drink before the medicine wears off, food or drink could go into your lungs. You could also burn yourself.  After 2 hours have passed and when you can cough and gag normally, you may eat soft food and drink liquids slowly.  The day after the test, you may eat your normal diet.  You may do your normal activities.  Keep all doctor visits. Get help right away if:  You get more and more short of breath.  You get light-headed.  You feel like you are going to pass out (faint).  You have chest pain.  You have new problems that worry you.  You cough up more than a little blood.  You cough up more blood than before. This information is not intended to replace advice given to you by your health care provider. Make sure you discuss any questions you have with your health care provider. Document Released: 07/29/2009 Document Revised: 03/08/2016 Document Reviewed: 06/05/2013 Elsevier Interactive Patient Education  2017 [place] FOR ANY QUESTIONS OR CONCERNS. [phone].   Baltazar Apo, MD, PhD 09/12/2016, 12:24 PM [facility] [phone] or if no answer [phone]

## 2016-09-12 NOTE — Transfer of Care (Signed)
Immediate Anesthesia Transfer of Care Note  Patient: Candace Graham  Procedure(s) Performed: Procedure(s): VIDEO BRONCHOSCOPY WITH ENDOBRONCHIAL NAVIGATION (N/A)  Patient Location: PACU  Anesthesia Type:General  Level of Consciousness: awake, patient cooperative and lethargic  Airway & Oxygen Therapy: Patient Spontanous Breathing and Patient connected to face mask oxygen  Post-op Assessment: Report given to RN, Post -op Vital signs reviewed and stable and Patient moving all extremities  Post vital signs: Reviewed and stable  Last Vitals:  Vitals:   09/12/16 0930  BP: (!) 158/88  Pulse: 68  Resp: 18  Temp: 36.9 C    Last Pain:  Vitals:   09/12/16 0930  TempSrc: Oral      Patients Stated Pain Goal: 3 ([id number])  Complications: No apparent anesthesia complications

## 2016-09-12 NOTE — Anesthesia Procedure Notes (Signed)
Procedure Name: Intubation Date/Time: 09/12/2016 10:54 AM Performed by: Tressia Miners LEFFEW Pre-anesthesia Checklist: Patient identified, Patient being monitored, Timeout performed, Emergency Drugs available and Suction available Patient Re-evaluated:Patient Re-evaluated prior to inductionOxygen Delivery Method: Circle System Utilized Preoxygenation: Pre-oxygenation with 100% oxygen Intubation Type: IV induction Ventilation: Mask ventilation without difficulty Laryngoscope Size: Mac and 4 Grade View: Grade I Tube type: Oral Tube size: 8.5 mm Number of attempts: 1 Airway Equipment and Method: Stylet Placement Confirmation: ETT inserted through vocal cords under direct vision,  positive ETCO2 and breath sounds checked- equal and bilateral Secured at: 22 cm Tube secured with: Tape Dental Injury: Teeth and Oropharynx as per pre-operative assessment

## 2016-09-12 NOTE — Op Note (Signed)
Video Bronchoscopy with Electromagnetic Navigation Procedure Note  Date of Operation: 09/12/2016  Pre-op Diagnosis: bilateral pulmonary nodules  Post-op Diagnosis: same  Surgeon: Baltazar Apo  Assistants: none  Anesthesia: General endotracheal anesthesia  Operation: Flexible video fiberoptic bronchoscopy with electromagnetic navigation and biopsies.  Estimated Blood Loss: [id number]  Complications: none apparent  Indications and History: Candace Graham is a 45 y.o. female with hx renal cell CA, noted to have bilateral scattered pulmonary nodules on CT chest and PET scan. Recommendation was made to attempt tissue diagnosis via navigational bronchoscopy.  The risks, benefits, complications, treatment options and expected outcomes were discussed with the patient.  The possibilities of pneumothorax, pneumonia, reaction to medication, pulmonary aspiration, perforation of a viscus, bleeding, failure to diagnose a condition and creating a complication requiring transfusion or operation were discussed with the patient who freely signed the consent.    Description of Procedure: The patient was seen in the Preoperative Area, was examined and was deemed appropriate to proceed.  The patient was taken to OR10, identified as Candace Graham and the procedure verified as Flexible Video Fiberoptic Bronchoscopy.  A Time Out was held and the above information confirmed.   Prior to the date of the procedure a high-resolution CT scan of the chest was performed. Utilizing [employer] a virtual tracheobronchial tree was generated to allow the creation of distinct navigation pathways to the patient's parenchymal abnormalities. Pathways to multiple nodules were planned. After being taken to the operating room general anesthesia was initiated and the patient  was orally intubated. The video fiberoptic bronchoscope was introduced via the endotracheal tube and a general inspection was performed which  showed normal airways throughout. The extendable working channel and locator guide were introduced into the bronchoscope. The distinct navigation pathways prepared prior to this procedure were then utilized to navigate to within 0.5 - 1.0 cm of patient's lesions identified on CT scan. The best navigation was achieved to target 1 (dominant RML nodule), target 2 (small adjacent RML nodule) and target 6 (small RUL nodule). The extendable working channel was secured into place at each of these three locatations and the locator guide was withdrawn. Under fluoroscopic guidance transbronchial needle brushings, transbronchial Wang needle biopsies, and transbronchial forceps biopsies were performed to be sent for cytology and pathology at each site. A bronchioalveolar lavage was performed in the RUL adjacent to target 6 to be sent for cell count, cytology and microbiology (bacterial, fungal, AFB smears and cultures). At the end of the procedure a general airway inspection was performed and there was minimal active bleeding. The bronchoscope was removed.  The patient tolerated the procedure well. There was no significant blood loss and there were no obvious complications. A post-procedural chest x-ray is pending.  Samples: 1. Transbronchial needle brushings from target 1 (RML) 2. Transbronchial Wang needle biopsies from target 1 (RML) 3. Transbronchial forceps biopsies from target 1 (RML) 4. Transbronchial needle brushings from target 2 (RML) 5. Transbronchial Wang needle biopsies from target 2 (RML) 6. Transbronchial forceps biopsies from target 2 (RML) 7. Transbronchial needle brushings from target 6 (RUL) 8. Transbronchial Wang needle biopsies from target 6 (RUL) 9. Transbronchial forceps biopsies from target 6 (RUL) 10. Bronchoalveolar lavage from RUL  Plans:  The patient will be discharged from the PACU to home when recovered from anesthesia and after chest x-ray is reviewed. We will review the cytology,  pathology and microbiology results with the patient when they become available. Outpatient followup will be with Dr Lamonte Sakai.   Herbie [place]  Lamonte Sakai, MD, PhD 09/12/2016, 12:20 PM [facility] [phone] or if no answer [phone]

## 2016-09-12 NOTE — Anesthesia Preprocedure Evaluation (Addendum)
Anesthesia Evaluation  Patient identified by MRN, date of birth, ID band Patient awake    Reviewed: Allergy & Precautions, NPO status , Patient's Chart, lab work & pertinent test results  History of Anesthesia Complications Negative for: history of anesthetic complications  Airway Mallampati: I  TM Distance: >3 FB Neck ROM: Full    Dental  (+) Teeth Intact, Dental Advisory Given   Pulmonary  pulm nodules   breath sounds clear to auscultation       Cardiovascular + Peripheral Vascular Disease   Rhythm:Regular Rate:Normal     Neuro/Psych negative neurological ROS     GI/Hepatic negative GI ROS, Neg liver ROS,   Endo/Other  negative endocrine ROS  Renal/GU Renal diseaseHx of Renal ca     Musculoskeletal   Abdominal   Peds  Hematology  (+) anemia ,   Anesthesia Other Findings   Reproductive/Obstetrics                            Anesthesia Physical Anesthesia Plan  ASA: II  Anesthesia Plan: General   Post-op Pain Management:    Induction: Intravenous  Airway Management Planned: Oral ETT  Additional Equipment:   Intra-op Plan:   Post-operative Plan: Extubation in OR  Informed Consent: I have reviewed the patients History and Physical, chart, labs and discussed the procedure including the risks, benefits and alternatives for the proposed anesthesia with the patient or authorized representative who has indicated his/her understanding and acceptance.   Dental advisory given  Plan Discussed with:   Anesthesia Plan Comments:         Anesthesia Quick Evaluation

## 2016-09-13 ENCOUNTER — Encounter (HOSPITAL_COMMUNITY): Payer: Self-pay | Admitting: Emergency Medicine

## 2016-09-13 LAB — ACID FAST SMEAR (AFB, MYCOBACTERIA): Acid Fast Smear: NEGATIVE

## 2016-09-13 NOTE — Anesthesia Postprocedure Evaluation (Signed)
Anesthesia Post Note  Patient: Cyndel Urness  Procedure(s) Performed: Procedure(s) (LRB): VIDEO BRONCHOSCOPY WITH ENDOBRONCHIAL NAVIGATION (N/A)  Patient location during evaluation: PACU Anesthesia Type: General Level of consciousness: awake and alert Pain management: pain level controlled Vital Signs Assessment: post-procedure vital signs reviewed and stable Respiratory status: spontaneous breathing, nonlabored ventilation, respiratory function stable and patient connected to nasal cannula oxygen Cardiovascular status: blood pressure returned to baseline and stable Postop Assessment: no signs of nausea or vomiting Anesthetic complications: no    Last Vitals:  Vitals:   09/12/16 1314 09/12/16 1322  BP: (!) 142/92 (!) 142/94  Pulse: 81 73  Resp: 11 16  Temp:      Last Pain:  Vitals:   09/12/16 1322  TempSrc:   PainSc: 2                  Shulamit Donofrio,JAMES TERRILL

## 2016-09-15 LAB — CULTURE, RESPIRATORY W GRAM STAIN: Culture: NO GROWTH

## 2016-09-17 ENCOUNTER — Telehealth: Payer: Self-pay | Admitting: Emergency Medicine

## 2016-09-17 NOTE — Telephone Encounter (Signed)
Spoke with pt. She would like the results from her bronch.  RB - please advise. Thanks.

## 2016-09-19 ENCOUNTER — Telehealth: Payer: Self-pay | Admitting: Emergency Medicine

## 2016-09-19 NOTE — Telephone Encounter (Signed)
Called and spoke with pt and she is aware again of [facility] recs.  She wanted to make sure that she did not need any abx.  Nothing further needed

## 2016-09-19 NOTE — Telephone Encounter (Signed)
Spoke with pt. She is aware of results. Pt has an upcoming appointment with RB on 10/23/16. Nothing further was needed at this time.

## 2016-09-19 NOTE — Telephone Encounter (Signed)
Please let her know that her bronchoscopy showed some rare atypical cells on biopsy of unclear significance. There was not enough present to be indicative of cancer of any kind. All of her cultures are negative so far as well. PLan for now will be to follow her pulmonary nodules with a repeat CT scan or PET scan. We will coordinate this at next visit with either myself or Dr Chase Caller

## 2016-10-11 LAB — FUNGUS CULTURE WITH STAIN

## 2016-10-11 LAB — FUNGUS CULTURE RESULT

## 2016-10-11 LAB — FUNGAL ORGANISM REFLEX

## 2016-10-23 ENCOUNTER — Ambulatory Visit: Payer: BLUE CROSS/BLUE SHIELD | Admitting: Emergency Medicine

## 2016-10-23 ENCOUNTER — Encounter: Payer: Self-pay | Admitting: Emergency Medicine

## 2016-10-23 ENCOUNTER — Ambulatory Visit (INDEPENDENT_AMBULATORY_CARE_PROVIDER_SITE_OTHER): Payer: BLUE CROSS/BLUE SHIELD | Admitting: Emergency Medicine

## 2016-10-23 VITALS — BP 130/86 | HR 60 | Ht 67.5 in | Wt 219.0 lb

## 2016-10-23 DIAGNOSIS — C649 Malignant neoplasm of unspecified kidney, except renal pelvis: Secondary | ICD-10-CM | POA: Diagnosis not present

## 2016-10-23 DIAGNOSIS — R911 Solitary pulmonary nodule: Secondary | ICD-10-CM

## 2016-10-23 DIAGNOSIS — Z23 Encounter for immunization: Secondary | ICD-10-CM | POA: Diagnosis not present

## 2016-10-23 NOTE — Assessment & Plan Note (Signed)
Navigational bronchoscopy consistent with atypical cells but nondiagnostic. I'm still concerned that malignancy may be present. She is due for a CT scan of her abdomen and pelvis in October. I would like to coordinate the chest CT with that scan and move it up to August 2018. She has to follow with me if she develops any risk for symptoms.

## 2016-10-23 NOTE — Patient Instructions (Signed)
We will arrange for a repeat CT scan of your chest and abdomen / pelvis for September 2018.  Please follow with Dr Lamonte Sakai in September after your CT scan is completed to review.  Call our office for any change in breathing or respiratory symptoms.  We will forward the results of the abdominal CT to Dr Tresa Moore for review.

## 2016-10-23 NOTE — Progress Notes (Signed)
Subjective:    Patient ID: Candace Graham, female    DOB: [birth year], 46 y.o.   MRN: [id number]  HPI 46 year old woman never smoker who has been diagnosed with L renal cell carcinoma. She has been seen by Dr Chase Caller for B rounded pulmonary nodules, one of which has mild hypermetabolic on PET from Q000111Q, remains present on CT chest from 08/24/16. She is here today to discuss possible biopsy. She had a L partial renal resection 07/27/16 with clear margins for the renal cell CA. Also underwent lung TTNA on 06/12/16 >> non-diagnostic.   She reports that she is asymptomatic. She does have post-nasal gtt and some cough.   Pt has a lot of questions regarding all of the test results to date, the pre-test probability of malignancy, the risks / benefits of FOB + biopsies.   ROV 10/23/16 -- This follow-up visit for bilateral pulmonary nodules noted on CT scan of the chest and PET scan as outlined above. She underwent navigational bronchoscopy on 09/12/16. The samples were negative with the exception of rare atypical cells seen on right middle lobe needle brushing. Also noted was mild atypia on transbronchial biopsies from each nodule. This was not diagnostic of malignancy. AFB and fungal smears were negative. Fungal culture was negative. AFB culture is not completed.    Review of Systems As above.       Objective:   Physical Exam Vitals:   10/23/16 1102  BP: 130/86  Pulse: 60  SpO2: 97%  Weight: 219 lb (99.3 kg)  Height: 5' 7.5" (1.715 m)   Gen: Pleasant, well-nourished, in no distress,  normal affect  ENT: No lesions,  mouth clear,  oropharynx clear, no postnasal drip  Neck: No JVD, no TMG, no carotid bruits  Lungs: No use of accessory muscles, no dullness to percussion, clear without rales or rhonchi  Cardiovascular: RRR, heart sounds normal, no murmur or gallops, no peripheral edema  Musculoskeletal: No deformities, no cyanosis or clubbing  Neuro: alert, non focal  Skin:  Warm, no lesions or rashes   08/24/16 --  EXAM: CT CHEST WITHOUT CONTRAST  TECHNIQUE: Multidetector CT imaging of the chest was performed using thin slice collimation for electromagnetic bronchoscopy planning purposes, without intravenous contrast.  COMPARISON:  PET-CT scan 06/20/2016 comma chest CT 05/18/2016  FINDINGS: Cardiovascular: No significant vascular findings. Normal heart size. No pericardial effusion.  Mediastinum/Nodes: Numerous small paratracheal lymph nodes are unchanged including 8 mm RIGHT lower paratracheal node. No adenopathy no pericardial fluid. Esophagus normal.  Lungs/Pleura: Bilateral round pulmonary nodules of varying sizes not changed in number size compared to prior. Example nodule RIGHT middle lobe measures 18 mm (image 30, series 3) compared 18 mm. Adjacent 12 mm nodule compares to 12 mm.  Within the LEFT lower lobe example nodule measures 9 mm (image 36, series 3) compared to 8 mm. In the lingula 8 mm nodule (image 23)unchanged from 8 mm.  Upper Abdomen: No metastatic disease in the upper abdomen  Low-density lesion the T1 vertebral body is most consists with benign hemangioma (image 9, series 2).  IMPRESSION: 1. Multiple round pulmonary nodules assumed to represent renal cell carcinoma metastasis are not changed in size or number compared to prior. 2. No pathologic enlarged mediastinal lymph nodes.     Assessment & Plan:  Multiple lung nodules Navigational bronchoscopy consistent with atypical cells but nondiagnostic. I'm still concerned that malignancy may be present. She is due for a CT scan of her abdomen and pelvis in October. I would  like to coordinate the chest CT with that scan and move it up to August 2018. She has to follow with me if she develops any risk for symptoms.  Baltazar Apo, MD, PhD 10/23/2016, 11:19 AM [facility] [phone] or if no answer [phone]

## 2016-10-26 LAB — ACID FAST CULTURE WITH REFLEXED SENSITIVITIES (MYCOBACTERIA): Acid Fast Culture: NEGATIVE

## 2017-01-16 DIAGNOSIS — Z85528 Personal history of other malignant neoplasm of kidney: Secondary | ICD-10-CM | POA: Diagnosis not present

## 2017-01-16 DIAGNOSIS — R911 Solitary pulmonary nodule: Secondary | ICD-10-CM | POA: Diagnosis not present

## 2017-01-16 DIAGNOSIS — N951 Menopausal and female climacteric states: Secondary | ICD-10-CM | POA: Diagnosis not present

## 2017-01-16 DIAGNOSIS — M545 Low back pain: Secondary | ICD-10-CM | POA: Diagnosis not present

## 2017-01-21 ENCOUNTER — Other Ambulatory Visit: Payer: Self-pay | Admitting: Family Medicine

## 2017-01-21 DIAGNOSIS — Z1231 Encounter for screening mammogram for malignant neoplasm of breast: Secondary | ICD-10-CM

## 2017-02-07 ENCOUNTER — Ambulatory Visit: Payer: BLUE CROSS/BLUE SHIELD

## 2017-02-22 ENCOUNTER — Ambulatory Visit
Admission: RE | Admit: 2017-02-22 | Discharge: 2017-02-22 | Disposition: A | Discharge status: Home / Self Care | Payer: BLUE CROSS/BLUE SHIELD | Source: Ambulatory Visit | Attending: Family Medicine | Admitting: Family Medicine

## 2017-02-22 DIAGNOSIS — Z1231 Encounter for screening mammogram for malignant neoplasm of breast: Secondary | ICD-10-CM

## 2017-03-21 ENCOUNTER — Ambulatory Visit (INDEPENDENT_AMBULATORY_CARE_PROVIDER_SITE_OTHER): Payer: BLUE CROSS/BLUE SHIELD | Admitting: Certified Nurse Midwife

## 2017-03-21 ENCOUNTER — Encounter: Payer: Self-pay | Admitting: Certified Nurse Midwife

## 2017-03-21 ENCOUNTER — Other Ambulatory Visit (HOSPITAL_COMMUNITY)
Admission: RE | Admit: 2017-03-21 | Discharge: 2017-03-21 | Disposition: A | Discharge status: Home / Self Care | Payer: BLUE CROSS/BLUE SHIELD | Source: Ambulatory Visit | Attending: Certified Nurse Midwife | Admitting: Certified Nurse Midwife

## 2017-03-21 VITALS — BP 118/74 | HR 70 | Resp 16 | Ht 67.25 in | Wt 224.0 lb

## 2017-03-21 DIAGNOSIS — D259 Leiomyoma of uterus, unspecified: Secondary | ICD-10-CM | POA: Diagnosis not present

## 2017-03-21 DIAGNOSIS — Z0000 Encounter for general adult medical examination without abnormal findings: Secondary | ICD-10-CM | POA: Diagnosis not present

## 2017-03-21 DIAGNOSIS — Z124 Encounter for screening for malignant neoplasm of cervix: Secondary | ICD-10-CM | POA: Diagnosis not present

## 2017-03-21 DIAGNOSIS — Z01419 Encounter for gynecological examination (general) (routine) without abnormal findings: Secondary | ICD-10-CM

## 2017-03-21 NOTE — Patient Instructions (Signed)

## 2017-03-21 NOTE — Progress Notes (Signed)
46 y.o. [id number] Single  African American Fe here for annual exam. LMP 09/15/15. Menopausal no HRT. Continues to have hot flashes/night sweats, feel they are less than before. No vaginal dryness or vaginal bleeding. Had surgery for kidney cancer 07/27/16 with mass totally removed with clean margins. No chemotherapy or radiation needed. Has follow up appointment in 2 months. Also noted on scans were lung nodules which were biopsied, benign, under pulmonary follow up. Has gained some of her weight loss. Very fatigued at times, consuming good clean meats [place], salmon for protein. Trying to eat better for "her body". Switching PCP, but does not have appointment yet. Desires Screening labs. No partner change but desires STD screening. Denies any vaginal concerns. No other health issues today. Son will soon have patient on a computer app. 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  Patient's last menstrual period was 08/15/2014.          Sexually active: Yes.    The current method of family planning is post menopausal status.    Exercising: No.  exercise Smoker:  no  Health Maintenance: Pap:  03-17-15 neg, 03-20-16 neg History of Abnormal Pap: no MMG:  02-22-17 category b density birads 1:neg Self Breast exams: yes Colonoscopy:  none BMD:   none TDaP:  2018 Shingles: no Pneumonia: no Hep C and HIV: not done Labs: none   reports that she has never smoked. She has never used smokeless tobacco. She reports that she does not drink alcohol or use drugs.  Past Medical History:  Diagnosis Date  . Abnormal uterine bleeding    Heavy menstrual cycles  . Anemia   . Cancer (HCC)    renal cell carcinoma  . Fibroid tumor   . Vaginal delivery 1991, 1999  . Varicose veins of left lower extremity     Past Surgical History:  Procedure Laterality Date  . DILATATION & CURRETTAGE/HYSTEROSCOPY WITH RESECTOCOPE N/A 04/25/2015   Procedure: DILATATION & CURETTAGE/HYSTEROSCOPY WITH RESECTOCOPE;  Surgeon: Megan Salon, MD;   Location: [facility];  Service: Gynecology;  Laterality: N/A;  . LUNG BIOPSY    . ROBOTIC ASSITED PARTIAL NEPHRECTOMY Left 07/27/2016   Procedure: XI ROBOTIC ASSITED LEFT PARTIAL NEPHRECTOMY;  Surgeon: Alexis Frock, MD;  Location: [facility];  Service: Urology;  Laterality: Left;  . uterine polyp removal    . VIDEO BRONCHOSCOPY WITH ENDOBRONCHIAL NAVIGATION N/A 09/12/2016   Procedure: VIDEO BRONCHOSCOPY WITH ENDOBRONCHIAL NAVIGATION;  Surgeon: Collene Gobble, MD;  Location: [facility];  Service: Thoracic;  Laterality: N/A;    No current outpatient prescriptions on file.   No current facility-administered medications for this visit.     Family History  Problem Relation Age of Onset  . Pancreatic cancer Mother   . Hypertension Mother   . Stroke Mother   . Thyroid disease Mother   . Hypertension Brother   . Hypertension Brother   . Heart disease Father   . Arthritis Father   . Alcohol abuse Maternal Grandmother   . Esophageal cancer Maternal Grandmother   . Drug abuse Maternal Grandfather     ROS:  Pertinent items are noted in HPI.  Otherwise, a comprehensive ROS was negative.  Exam:   BP 118/74   Pulse 70   Resp 16   Ht 5' 7.25" (1.708 m)   Wt 224 lb (101.6 kg)   LMP 08/15/2014   BMI 34.82 kg/m  Height: 5' 7.25" (170.8 cm) Ht Readings from Last 3 Encounters:  03/21/17 5' 7.25" (1.708 m)  10/23/16  5' 7.5" (1.715 m)  09/12/16 5' 7.5" (1.715 m)    General appearance: alert, cooperative and appears stated age Head: Normocephalic, without obvious abnormality, atraumatic Neck: no adenopathy, supple, symmetrical, trachea midline and thyroid normal to inspection and palpation Lungs: clear to auscultation bilaterally Breasts: normal appearance, no masses or tenderness, No nipple retraction or dimpling, No nipple discharge or bleeding, No axillary or supraclavicular adenopathy Heart: regular rate and rhythm Abdomen: soft, non-tender; no masses,  no organomegaly Extremities: extremities  normal, atraumatic, no cyanosis or edema Skin: Skin color, texture, turgor normal. No rashes or lesions Lymph nodes: Cervical, supraclavicular, and axillary nodes normal. No abnormal inguinal nodes palpated Neurologic: Grossly normal   Pelvic: External genitalia:  no lesions              Urethra:  normal appearing urethra with no masses, tenderness or lesions              Bartholin's and Skene's: normal                 Vagina: normal appearing vagina with normal color and discharge, no lesions              Cervix: multiparous appearance, no cervical motion tenderness and no lesions              Pap taken: Yes.   Bimanual Exam:  Uterus:  enlarged, 10-12 week size history of fibroids weeks size              Adnexa: normal adnexa and no mass, fullness, tenderness               Rectovaginal: Confirms               Anus:  normal sphincter tone, no lesions  Chaperone present: yes  A:  Well Woman with normal exam  Menopausal  No HRT still symptomatic  Enlarged uterus history of fibroids, size less than last exam  Kidney cancer surgery in 10/17, clean margins no chemotherapy or radiation needed. Under follow up with nephrology.  Lung nodules with negative biopsy, under pulmonary follow up  Screening labs  P:   Reviewed health and wellness pertinent to exam  Discussed importance of notifying if vaginal bleeding occurs, now that she is menopausal  Discussed uterine findings and feel slight decrease in uterine size. Does not need PUS unless increase in size or symptoms.Questions addressed  Continue follow up with MD as indicated  Labs: CBC,CMP,Lipid panel, TSH, HIV,RPR, GC,Chlamydia,Affirm  Pap smear: yes   counseled on breast self exam, mammography screening, STD prevention, HIV risk factors and prevention, menopause, adequate intake of calcium and vitamin D, diet and exercise  return annually or prn  An After Visit Summary was printed and given to the patient.

## 2017-03-22 LAB — COMPREHENSIVE METABOLIC PANEL
ALT: 16 IU/L (ref 0–32)
AST: 19 IU/L (ref 0–40)
Albumin/Globulin Ratio: 1.3 (ref 1.2–2.2)
Albumin: 4 g/dL (ref 3.5–5.5)
Alkaline Phosphatase: 99 IU/L (ref 39–117)
BUN/Creatinine Ratio: 19 (ref 9–23)
BUN: 15 mg/dL (ref 6–24)
Bilirubin Total: 0.3 mg/dL (ref 0.0–1.2)
CO2: 25 mmol/L (ref 18–29)
Calcium: 9.5 mg/dL (ref 8.7–10.2)
Chloride: 105 mmol/L (ref 96–106)
Creatinine, Ser: 0.79 mg/dL (ref 0.57–1.00)
GFR calc Af Amer: 103 mL/min/{1.73_m2} (ref >59)
GFR calc non Af Amer: 89 mL/min/{1.73_m2} (ref >59)
Globulin, Total: 3.2 g/dL (ref 1.5–4.5)
Glucose: 86 mg/dL (ref 65–99)
Potassium: 4.5 mmol/L (ref 3.5–5.2)
Sodium: 143 mmol/L (ref 134–144)
Total Protein: 7.2 g/dL (ref 6.0–8.5)

## 2017-03-22 LAB — CBC
Hematocrit: 37.5 % (ref 34.0–46.6)
Hemoglobin: 11.9 g/dL (ref 11.1–15.9)
MCH: 24.8 pg — ABNORMAL LOW (ref 26.6–33.0)
MCHC: 31.7 g/dL (ref 31.5–35.7)
MCV: 78 fL — ABNORMAL LOW (ref 79–97)
Platelets: 312 10*3/uL (ref 150–379)
RBC: 4.8 x10E6/uL (ref 3.77–5.28)
RDW: 15.1 % (ref 12.3–15.4)
WBC: 4.4 10*3/uL (ref 3.4–10.8)

## 2017-03-22 LAB — LIPID PANEL
Chol/HDL Ratio: 3.8 ratio (ref 0.0–4.4)
Cholesterol, Total: 162 mg/dL (ref 100–199)
HDL: 43 mg/dL (ref >39)
LDL Calculated: 101 mg/dL — ABNORMAL HIGH (ref 0–99)
Triglycerides: 92 mg/dL (ref 0–149)
VLDL Cholesterol Cal: 18 mg/dL (ref 5–40)

## 2017-03-22 LAB — RPR: RPR Ser Ql: NONREACTIVE

## 2017-03-22 LAB — HIV ANTIBODY (ROUTINE TESTING W REFLEX): HIV Screen 4th Generation wRfx: NONREACTIVE

## 2017-03-22 LAB — TSH: TSH: 2.1 u[IU]/mL (ref 0.450–4.500)

## 2017-03-22 LAB — VITAMIN D 25 HYDROXY (VIT D DEFICIENCY, FRACTURES): Vit D, 25-Hydroxy: 18.8 ng/mL — ABNORMAL LOW (ref 30.0–100.0)

## 2017-03-23 LAB — VAGINITIS/VAGINOSIS, DNA PROBE
Candida Species: NEGATIVE
Gardnerella vaginalis: NEGATIVE
Trichomonas vaginosis: NEGATIVE

## 2017-03-26 LAB — CYTOLOGY - PAP
Adequacy: ABSENT
Chlamydia: NEGATIVE
Diagnosis: NEGATIVE
HPV: NOT DETECTED
Neisseria Gonorrhea: NEGATIVE

## 2017-05-20 ENCOUNTER — Ambulatory Visit: Payer: BLUE CROSS/BLUE SHIELD | Admitting: Emergency Medicine

## 2017-05-20 ENCOUNTER — Telehealth: Payer: Self-pay | Admitting: Emergency Medicine

## 2017-05-20 NOTE — Telephone Encounter (Signed)
Left pt message pt to notify that office would have to be closed this am due to power outage. We will have to reschedule

## 2017-05-21 ENCOUNTER — Encounter: Payer: Self-pay | Admitting: Emergency Medicine

## 2017-05-21 ENCOUNTER — Ambulatory Visit (INDEPENDENT_AMBULATORY_CARE_PROVIDER_SITE_OTHER): Payer: BLUE CROSS/BLUE SHIELD | Admitting: Emergency Medicine

## 2017-05-21 DIAGNOSIS — C642 Malignant neoplasm of left kidney, except renal pelvis: Secondary | ICD-10-CM | POA: Diagnosis not present

## 2017-05-21 DIAGNOSIS — R918 Other nonspecific abnormal finding of lung field: Secondary | ICD-10-CM

## 2017-05-21 NOTE — Patient Instructions (Addendum)
We will schedule your CT scan of the chest and abdomen to compare with your prior study Follow with Dr Lamonte Sakai next available to review the results of your scans and to plan our next steps.

## 2017-05-21 NOTE — Assessment & Plan Note (Signed)
Unclear etiology. Her TTNA was negative, transbronchial biopsies also negative in 08/2016. Cultures all negative. She does not have any clinical indications of progressive malignancy but remains at high risk given her history of renal cell carcinoma. She needs a repeat CT scan of her chest and abdomen to follow for progression. If her nodules are larger than I believe she needs repeat biopsy. I will see her after the scans to discuss results and plan a strategy for biopsy if indicated.

## 2017-05-21 NOTE — Progress Notes (Signed)
Subjective:    Patient ID: Candace Graham, female    DOB: May 04, 1971, 46 y.o.   MRN: [id number]  HPI 46 year old woman never smoker who has been diagnosed with L renal cell carcinoma. She has been seen by Dr Chase Caller for B rounded pulmonary nodules, one of which has mild hypermetabolic on PET from 01/19/64, remains present on CT chest from 08/24/16. She is here today to discuss possible biopsy. She had a L partial renal resection 07/27/16 with clear margins for the renal cell CA. Also underwent lung TTNA on 06/12/16 >> non-diagnostic.   She reports that she is asymptomatic. She does have post-nasal gtt and some cough.   Pt has a lot of questions regarding all of the test results to date, the pre-test probability of malignancy, the risks / benefits of FOB + biopsies.   ROV 10/23/16 -- This follow-up visit for bilateral pulmonary nodules noted on CT scan of the chest and PET scan as outlined above. She underwent navigational bronchoscopy on 09/12/16. The samples were negative with the exception of rare atypical cells seen on right middle lobe needle brushing. Also noted was mild atypia on transbronchial biopsies from each nodule. This was not diagnostic of malignancy. AFB and fungal smears were negative. Fungal culture was negative. AFB culture is not completed.   ROV 05/21/17 -- Candace Graham has a history of left renal cell carcinoma and bilateral rounded pulmonary nodules. We performed navigational bronchoscopy 09/12/16 - atypical cells noted, no evidence for malignancy or atypical infection. TTNA was negative in 05/2016.  She has not had a repeat CT scan of the chest and abdomen yet. She still has some L low back pain that is stable. She is having some exertional SOB with stairs, doesn't need to stop. She has gained 20 lbs since last time. No new cough. No CP.    Review of Systems As above.       Objective:   Physical Exam Vitals:   05/21/17 1122 05/21/17 1123  BP:  122/78  Pulse:  77    SpO2:  97%  Weight: 227 lb (103 kg)   Height: 5' 7.5" (1.715 m)    Gen: Pleasant, overwt woman, in no distress,  normal affect  ENT: No lesions,  mouth clear,  oropharynx clear, no postnasal drip  Neck: No JVD, no TMG, no carotid bruits  Lungs: No use of accessory muscles, clear without rales or rhonchi  Cardiovascular: RRR, heart sounds normal, no murmur or gallops, no peripheral edema  Musculoskeletal: No deformities, no cyanosis or clubbing  Neuro: alert, non focal  Skin: Warm, no lesions or rashes   08/24/16 --  EXAM: CT CHEST WITHOUT CONTRAST  TECHNIQUE: Multidetector CT imaging of the chest was performed using thin slice collimation for electromagnetic bronchoscopy planning purposes, without intravenous contrast.  COMPARISON:  PET-CT scan 06/20/2016 comma chest CT 05/18/2016  FINDINGS: Cardiovascular: No significant vascular findings. Normal heart size. No pericardial effusion.  Mediastinum/Nodes: Numerous small paratracheal lymph nodes are unchanged including 8 mm RIGHT lower paratracheal node. No adenopathy no pericardial fluid. Esophagus normal.  Lungs/Pleura: Bilateral round pulmonary nodules of varying sizes not changed in number size compared to prior. Example nodule RIGHT middle lobe measures 18 mm (image 30, series 3) compared 18 mm. Adjacent 12 mm nodule compares to 12 mm.  Within the LEFT lower lobe example nodule measures 9 mm (image 36, series 3) compared to 8 mm. In the lingula 8 mm nodule (image 23)unchanged from 8 mm.  Upper Abdomen: No  metastatic disease in the upper abdomen  Low-density lesion the T1 vertebral body is most consists with benign hemangioma (image 9, series 2).  IMPRESSION: 1. Multiple round pulmonary nodules assumed to represent renal cell carcinoma metastasis are not changed in size or number compared to prior. 2. No pathologic enlarged mediastinal lymph nodes.     Assessment & Plan:  Multiple lung  nodules Unclear etiology. Her TTNA was negative, transbronchial biopsies also negative in 08/2016. Cultures all negative. She does not have any clinical indications of progressive malignancy but remains at high risk given her history of renal cell carcinoma. She needs a repeat CT scan of her chest and abdomen to follow for progression. If her nodules are larger than I believe she needs repeat biopsy. I will see her after the scans to discuss results and plan a strategy for biopsy if indicated.  Renal cell carcinoma ([place]) She is due for a CT scan of her abdomen, follow-up with urology.  Baltazar Apo, MD, PhD 05/21/2017, 11:52 AM [facility] [phone] or if no answer [phone]

## 2017-05-21 NOTE — Addendum Note (Signed)
Addended by: Desmond Dike C on: 05/21/2017 12:15 PM   Modules accepted: Orders

## 2017-05-21 NOTE — Assessment & Plan Note (Signed)
She is due for a CT scan of her abdomen, follow-up with urology.

## 2017-05-23 ENCOUNTER — Other Ambulatory Visit (INDEPENDENT_AMBULATORY_CARE_PROVIDER_SITE_OTHER): Payer: BLUE CROSS/BLUE SHIELD

## 2017-05-23 DIAGNOSIS — R918 Other nonspecific abnormal finding of lung field: Secondary | ICD-10-CM | POA: Diagnosis not present

## 2017-05-23 LAB — BASIC METABOLIC PANEL
BUN: 17 mg/dL (ref 6–23)
CO2: 27 mEq/L (ref 19–32)
Calcium: 9.1 mg/dL (ref 8.4–10.5)
Chloride: 106 mEq/L (ref 96–112)
Creatinine, Ser: 0.81 mg/dL (ref 0.40–1.20)
GFR: 97.24 mL/min (ref >60.00)
Glucose, Bld: 94 mg/dL (ref 70–99)
Potassium: 4 mEq/L (ref 3.5–5.1)
Sodium: 138 mEq/L (ref 135–145)

## 2017-05-24 ENCOUNTER — Ambulatory Visit (INDEPENDENT_AMBULATORY_CARE_PROVIDER_SITE_OTHER)
Admission: RE | Admit: 2017-05-24 | Discharge: 2017-05-24 | Disposition: A | Discharge status: Home / Self Care | Payer: BLUE CROSS/BLUE SHIELD | Source: Ambulatory Visit | Attending: Emergency Medicine | Admitting: Emergency Medicine

## 2017-05-24 DIAGNOSIS — R911 Solitary pulmonary nodule: Secondary | ICD-10-CM | POA: Diagnosis not present

## 2017-05-24 DIAGNOSIS — C649 Malignant neoplasm of unspecified kidney, except renal pelvis: Secondary | ICD-10-CM

## 2017-05-24 MED ORDER — IOPAMIDOL (ISOVUE-370) INJECTION 76%
100.0000 mL | Freq: Once | INTRAVENOUS | Status: AC | PRN
  Administered 2017-05-24: 100 mL via INTRAVENOUS

## 2017-05-27 ENCOUNTER — Telehealth: Payer: Self-pay | Admitting: Emergency Medicine

## 2017-05-27 NOTE — Telephone Encounter (Signed)
Pt is calling for CT results - chest and abd/chest  Pt is requesting a sooner appt if needed based on the CT results- states that it was dicussed last OV that based on the CT results she may need a sooner appt and she would be worked in.   Please advise Dr Lamonte Sakai. Thanks.

## 2017-05-28 NOTE — Telephone Encounter (Signed)
Please let her know that I have reviewed the Ct's. There is no evidence for active / progressive disease in the abdomen following her surgery. Her Ct chest shows some slight enlargement of the most prominent pulmonary nodules that we have been following. Based on the change, I believe that we need to set up a repeat lung biopsy as we discussed at her last office visit. Please set her up for an earlier OV, likely need to work her into schedule. Thanks.

## 2017-05-28 NOTE — Telephone Encounter (Signed)
Spoke with pt. She is aware of her results. OV has been scheduled for 06/14/17 at 1:30pm. Nothing further was needed at this time.

## 2017-06-13 ENCOUNTER — Ambulatory Visit: Payer: BLUE CROSS/BLUE SHIELD | Admitting: Emergency Medicine

## 2017-06-13 ENCOUNTER — Encounter: Payer: Self-pay | Admitting: Emergency Medicine

## 2017-06-13 ENCOUNTER — Ambulatory Visit (INDEPENDENT_AMBULATORY_CARE_PROVIDER_SITE_OTHER): Payer: BLUE CROSS/BLUE SHIELD | Admitting: Emergency Medicine

## 2017-06-13 VITALS — BP 116/82 | HR 71 | Ht 67.0 in | Wt 229.0 lb

## 2017-06-13 DIAGNOSIS — R911 Solitary pulmonary nodule: Secondary | ICD-10-CM | POA: Diagnosis not present

## 2017-06-13 DIAGNOSIS — I38 Endocarditis, valve unspecified: Secondary | ICD-10-CM | POA: Diagnosis not present

## 2017-06-13 NOTE — Progress Notes (Signed)
Subjective:    Patient ID: Candace Graham, female    DOB: 12/31/1970, 46 y.o.   MRN: [id number]  HPI 46 year old woman never smoker who has been diagnosed with L renal cell carcinoma. She has been seen by Dr Chase Caller for B rounded pulmonary nodules, one of which has mild hypermetabolic on PET from 0/9/32, remains present on CT chest from 08/24/16. She is here today to discuss possible biopsy. She had a L partial renal resection 07/27/16 with clear margins for the renal cell CA. Also underwent lung TTNA on 06/12/16 >> non-diagnostic.   She reports that she is asymptomatic. She does have post-nasal gtt and some cough.   Pt has a lot of questions regarding all of the test results to date, the pre-test probability of malignancy, the risks / benefits of FOB + biopsies.   ROV 10/23/16 -- This follow-up visit for bilateral pulmonary nodules noted on CT scan of the chest and PET scan as outlined above. She underwent navigational bronchoscopy on 09/12/16. The samples were negative with the exception of rare atypical cells seen on right middle lobe needle brushing. Also noted was mild atypia on transbronchial biopsies from each nodule. This was not diagnostic of malignancy. AFB and fungal smears were negative. Fungal culture was negative. AFB culture is not completed.   ROV 05/21/17 -- Candace Graham has a history of left renal cell carcinoma and bilateral rounded pulmonary nodules. We performed navigational bronchoscopy 09/12/16 - atypical cells noted, no evidence for malignancy or atypical infection. TTNA was negative in 05/2016.  She has not had a repeat CT scan of the chest and abdomen yet. She still has some L low back pain that is stable. She is having some exertional SOB with stairs, doesn't need to stop. She has gained 20 lbs since last time. No new cough. No CP.   ROV 06/13/17 -- This is a follow-up visit for patient with a history of bilateral round pulmonary nodules in the setting of left renal cell  carcinoma. Navigational bronchoscopy 09/12/16 was unrevealing, as was TTNA 05/2016. We have repeated her CT scan of the chest and abdomen on 05/24/17 that I personally reviewed. This shows slight enlargement of the nodules, no disease below the diaphragm. She continues to feel the same. Denies fevers, cough, sputum.   Review of Systems As above.       Objective:   Physical Exam Vitals:   06/13/17 1506 06/13/17 1507  BP:  116/82  Pulse:  71  SpO2:  99%  Weight: 229 lb (103.9 kg)   Height: 5\' 7"  (1.702 m)    Gen: Pleasant, overwt woman, in no distress,  normal affect  ENT: No lesions,  mouth clear,  oropharynx clear, no postnasal drip  Neck: No JVD, no TMG, no carotid bruits  Lungs: No use of accessory muscles, clear without rales or rhonchi  Cardiovascular: RRR, heart sounds normal, no murmur or gallops, no peripheral edema  Musculoskeletal: No deformities, no cyanosis or clubbing  Neuro: alert, non focal  Skin: Warm, no lesions or rashes        Assessment & Plan:  Multiple lung nodules Bilateral pulmonary nodules that are enlarging. There are various sizes and scattered consistent with possible metastatic disease. I suppose that other embolic processes such as endocarditis should be considered. She doesn't have any clinical symptoms to support this diagnosis. Her PET scan one year ago was unremarkable. I will repeat one now. I will also check an echocardiogram. Once we have this data back  on believe we will need to plan another biopsy. I will discuss with interventional radiology possible TTNA versus navigational bronchoscopy.  Baltazar Apo, MD, PhD 06/13/2017, 3:39 PM [facility] [phone] or if no answer [phone]

## 2017-06-13 NOTE — Assessment & Plan Note (Signed)
Bilateral pulmonary nodules that are enlarging. There are various sizes and scattered consistent with possible metastatic disease. I suppose that other embolic processes such as endocarditis should be considered. She doesn't have any clinical symptoms to support this diagnosis. Her PET scan one year ago was unremarkable. I will repeat one now. I will also check an echocardiogram. Once we have this data back on believe we will need to plan another biopsy. I will discuss with interventional radiology possible TTNA versus navigational bronchoscopy.

## 2017-06-13 NOTE — Patient Instructions (Addendum)
We will perform a PET scan to further evaluate your per nodules We will perform an echocardiogram to assess for possible endocarditis Depending on your test results we will plan a biopsy strategy for your pulmonary nodules. Follow with Dr Lamonte Sakai next available

## 2017-06-13 NOTE — Addendum Note (Signed)
Addended by: Desmond Dike C on: 06/13/2017 03:45 PM   Modules accepted: Orders

## 2017-06-14 ENCOUNTER — Ambulatory Visit: Payer: BLUE CROSS/BLUE SHIELD | Admitting: Emergency Medicine

## 2017-06-26 ENCOUNTER — Other Ambulatory Visit: Payer: Self-pay

## 2017-06-26 ENCOUNTER — Other Ambulatory Visit: Payer: BLUE CROSS/BLUE SHIELD

## 2017-06-26 ENCOUNTER — Ambulatory Visit (HOSPITAL_BASED_OUTPATIENT_CLINIC_OR_DEPARTMENT_OTHER): Payer: BLUE CROSS/BLUE SHIELD

## 2017-06-26 DIAGNOSIS — I38 Endocarditis, valve unspecified: Secondary | ICD-10-CM | POA: Diagnosis not present

## 2017-06-26 DIAGNOSIS — Z85528 Personal history of other malignant neoplasm of kidney: Secondary | ICD-10-CM | POA: Diagnosis not present

## 2017-06-26 DIAGNOSIS — R911 Solitary pulmonary nodule: Secondary | ICD-10-CM | POA: Diagnosis not present

## 2017-06-26 DIAGNOSIS — R918 Other nonspecific abnormal finding of lung field: Secondary | ICD-10-CM | POA: Diagnosis not present

## 2017-06-26 DIAGNOSIS — Z8249 Family history of ischemic heart disease and other diseases of the circulatory system: Secondary | ICD-10-CM | POA: Diagnosis not present

## 2017-06-26 DIAGNOSIS — I059 Rheumatic mitral valve disease, unspecified: Secondary | ICD-10-CM | POA: Diagnosis not present

## 2017-06-27 ENCOUNTER — Ambulatory Visit (HOSPITAL_COMMUNITY)
Admission: RE | Admit: 2017-06-27 | Discharge: 2017-06-27 | Disposition: A | Discharge status: Home / Self Care | Payer: BLUE CROSS/BLUE SHIELD | Source: Ambulatory Visit | Attending: Emergency Medicine | Admitting: Emergency Medicine

## 2017-06-27 ENCOUNTER — Encounter: Payer: Self-pay | Admitting: Emergency Medicine

## 2017-06-27 ENCOUNTER — Ambulatory Visit (INDEPENDENT_AMBULATORY_CARE_PROVIDER_SITE_OTHER): Payer: BLUE CROSS/BLUE SHIELD | Admitting: Emergency Medicine

## 2017-06-27 VITALS — BP 118/74 | HR 78 | Ht 67.0 in | Wt 229.0 lb

## 2017-06-27 DIAGNOSIS — Z85528 Personal history of other malignant neoplasm of kidney: Secondary | ICD-10-CM | POA: Insufficient documentation

## 2017-06-27 DIAGNOSIS — I38 Endocarditis, valve unspecified: Secondary | ICD-10-CM | POA: Diagnosis not present

## 2017-06-27 DIAGNOSIS — Z8249 Family history of ischemic heart disease and other diseases of the circulatory system: Secondary | ICD-10-CM | POA: Diagnosis not present

## 2017-06-27 DIAGNOSIS — R918 Other nonspecific abnormal finding of lung field: Secondary | ICD-10-CM | POA: Insufficient documentation

## 2017-06-27 DIAGNOSIS — R911 Solitary pulmonary nodule: Secondary | ICD-10-CM | POA: Insufficient documentation

## 2017-06-27 DIAGNOSIS — I059 Rheumatic mitral valve disease, unspecified: Secondary | ICD-10-CM | POA: Insufficient documentation

## 2017-06-27 LAB — GLUCOSE, CAPILLARY: Glucose-Capillary: 114 mg/dL — ABNORMAL HIGH (ref 65–99)

## 2017-06-27 MED ORDER — FLUDEOXYGLUCOSE F - 18 (FDG) INJECTION
12.8000 | Freq: Once | INTRAVENOUS | Status: AC | PRN
  Administered 2017-06-27: 12.8 via INTRAVENOUS

## 2017-06-27 NOTE — Assessment & Plan Note (Signed)
PET scan shows only minimal hypermetabolism in her bilateral nodules. This would be reassuring but rarely under some circumstances renal cell carcinoma does not light up. Still worried about metastatic disease. I think she needs navigational bronchoscopy now. If unrevealing then I will ask for a nodule to be resected for histopathology.

## 2017-06-27 NOTE — Progress Notes (Signed)
Subjective:    Patient ID: Candace Graham, female    DOB: 12-Dec-1970, 46 y.o.   MRN: [id number]  HPI 46 year old woman never smoker who has been diagnosed with L renal cell carcinoma. She has been seen by Dr Chase Caller for B rounded pulmonary nodules, one of which has mild hypermetabolic on PET from 03/20/05, remains present on CT chest from 08/24/16. She is here today to discuss possible biopsy. She had a L partial renal resection 07/27/16 with clear margins for the renal cell CA. Also underwent lung TTNA on 06/12/16 >> non-diagnostic.   She reports that she is asymptomatic. She does have post-nasal gtt and some cough.   Pt has a lot of questions regarding all of the test results to date, the pre-test probability of malignancy, the risks / benefits of FOB + biopsies.   ROV 10/23/16 -- This follow-up visit for bilateral pulmonary nodules noted on CT scan of the chest and PET scan as outlined above. She underwent navigational bronchoscopy on 09/12/16. The samples were negative with the exception of rare atypical cells seen on right middle lobe needle brushing. Also noted was mild atypia on transbronchial biopsies from each nodule. This was not diagnostic of malignancy. AFB and fungal smears were negative. Fungal culture was negative. AFB culture is not completed.   ROV 05/21/17 -- Candace Graham has a history of left renal cell carcinoma and bilateral rounded pulmonary nodules. We performed navigational bronchoscopy 09/12/16 - atypical cells noted, no evidence for malignancy or atypical infection. TTNA was negative in 05/2016.  She has not had a repeat CT scan of the chest and abdomen yet. She still has some L low back pain that is stable. She is having some exertional SOB with stairs, doesn't need to stop. She has gained 20 lbs since last time. No new cough. No CP.   ROV 06/13/17 -- This is a follow-up visit for patient with a history of bilateral round pulmonary nodules in the setting of left renal cell  carcinoma. Navigational bronchoscopy 09/12/16 was unrevealing, as was TTNA 05/2016. We have repeated her CT scan of the chest and abdomen on 05/24/17 that I personally reviewed. This shows slight enlargement of the nodules, no disease below the diaphragm. She continues to feel the same. Denies fevers, cough, sputum.   ROV 06/27/17 -- this is a follow-up visit for abnormal CT scan of the chest in a patient with a history of renal cell carcinoma. Attempted biopsies by TTNA and also navigational bronchoscopy, all cytologies negative so far. By imaging it looks like an embolic process. I checked an echocardiogram done on 06/26/17 that showed no evidence of endocarditis. PET scan performed on 06/27/17 was reviewed by me. This shows multiple bilateral pulmonary nodules, dominant lesion 2.0 x 1.9 in the right middle lobe, very minimal FDG uptake ( SUV max equal 2.1). A 10 mm left upper lobe nodule has SUV max 2.2. She is feeling no different.   Rarely, metastatic lesions from renal cell, prostate, or testicular cancers may be devoid of detectable amounts of FDG    Review of Systems As above.     Objective:   Physical Exam Vitals:   06/27/17 1138  BP: 118/74  Pulse: 78  SpO2: 98%  Weight: 229 lb (103.9 kg)  Height: 5\' 7"  (1.702 m)   Gen: Pleasant, overwt woman, in no distress,  normal affect  ENT: No lesions,  mouth clear,  oropharynx clear, no postnasal drip  Neck: No JVD, no TMG, no carotid bruits  Lungs: No use of accessory muscles, clear without rales or rhonchi  Cardiovascular: RRR, heart sounds normal, no murmur or gallops, no peripheral edema  Musculoskeletal: No deformities, no cyanosis or clubbing  Neuro: alert, non focal  Skin: Warm, no lesions or rashes       Assessment & Plan:  Multiple lung nodules PET scan shows only minimal hypermetabolism in her bilateral nodules. This would be reassuring but rarely under some circumstances renal cell carcinoma does not light up. Still  worried about metastatic disease. I think she needs navigational bronchoscopy now. If unrevealing then I will ask for a nodule to be resected for histopathology.   30 minutes of a 40 minute appointment spent counseling, discussing possible diagnoses, discussing possible diagnostic testing   Baltazar Apo, MD, PhD 06/27/2017, 12:15 PM [facility] [phone] or if no answer [phone]

## 2017-06-27 NOTE — Patient Instructions (Signed)
We will set up a navigational bronchoscopy to biopsy lung nodules.  In order to perform the biopsies we will perform a repeat Super D Ct scan of the chest, no contrast Follow with Dr Lamonte Sakai in 1 month

## 2017-07-01 ENCOUNTER — Other Ambulatory Visit: Payer: Self-pay | Admitting: Emergency Medicine

## 2017-07-01 ENCOUNTER — Ambulatory Visit (INDEPENDENT_AMBULATORY_CARE_PROVIDER_SITE_OTHER)
Admission: RE | Admit: 2017-07-01 | Discharge: 2017-07-01 | Disposition: A | Discharge status: Home / Self Care | Payer: BLUE CROSS/BLUE SHIELD | Source: Ambulatory Visit | Attending: Emergency Medicine | Admitting: Emergency Medicine

## 2017-07-01 DIAGNOSIS — R918 Other nonspecific abnormal finding of lung field: Secondary | ICD-10-CM | POA: Diagnosis not present

## 2017-07-02 ENCOUNTER — Telehealth: Payer: Self-pay | Admitting: Certified Nurse Midwife

## 2017-07-02 ENCOUNTER — Other Ambulatory Visit: Payer: BLUE CROSS/BLUE SHIELD

## 2017-07-02 NOTE — Telephone Encounter (Signed)
Left message on voicemail to reschedule missed lab appointment. °

## 2017-07-03 ENCOUNTER — Other Ambulatory Visit: Payer: BLUE CROSS/BLUE SHIELD

## 2017-07-03 ENCOUNTER — Other Ambulatory Visit (INDEPENDENT_AMBULATORY_CARE_PROVIDER_SITE_OTHER): Payer: BLUE CROSS/BLUE SHIELD

## 2017-07-03 DIAGNOSIS — E559 Vitamin D deficiency, unspecified: Secondary | ICD-10-CM

## 2017-07-03 NOTE — Telephone Encounter (Signed)
Patient came in today for labs. Ok to close note?

## 2017-07-03 NOTE — Telephone Encounter (Signed)
yes

## 2017-07-04 ENCOUNTER — Other Ambulatory Visit: Payer: Self-pay | Admitting: Certified Nurse Midwife

## 2017-07-04 DIAGNOSIS — E559 Vitamin D deficiency, unspecified: Secondary | ICD-10-CM

## 2017-07-04 LAB — VITAMIN D 25 HYDROXY (VIT D DEFICIENCY, FRACTURES): Vit D, 25-Hydroxy: 19.4 ng/mL — ABNORMAL LOW (ref 30.0–100.0)

## 2017-07-08 ENCOUNTER — Encounter (HOSPITAL_COMMUNITY): Payer: Self-pay

## 2017-07-08 ENCOUNTER — Encounter (HOSPITAL_COMMUNITY)
Admission: RE | Admit: 2017-07-08 | Discharge: 2017-07-08 | Disposition: A | Discharge status: Home / Self Care | Payer: BLUE CROSS/BLUE SHIELD | Source: Ambulatory Visit | Attending: Emergency Medicine | Admitting: Emergency Medicine

## 2017-07-08 DIAGNOSIS — Z01812 Encounter for preprocedural laboratory examination: Secondary | ICD-10-CM

## 2017-07-08 DIAGNOSIS — Z85528 Personal history of other malignant neoplasm of kidney: Secondary | ICD-10-CM | POA: Diagnosis not present

## 2017-07-08 DIAGNOSIS — R918 Other nonspecific abnormal finding of lung field: Secondary | ICD-10-CM

## 2017-07-08 LAB — COMPREHENSIVE METABOLIC PANEL
ALT: 24 U/L (ref 14–54)
AST: 24 U/L (ref 15–41)
Albumin: 3.6 g/dL (ref 3.5–5.0)
Alkaline Phosphatase: 91 U/L (ref 38–126)
Anion gap: 3 — ABNORMAL LOW (ref 5–15)
BUN: 8 mg/dL (ref 6–20)
CO2: 28 mmol/L (ref 22–32)
Calcium: 9 mg/dL (ref 8.9–10.3)
Chloride: 107 mmol/L (ref 101–111)
Creatinine, Ser: 0.7 mg/dL (ref 0.44–1.00)
GFR calc Af Amer: >60 mL/min (ref >60)
GFR calc non Af Amer: >60 mL/min (ref >60)
Glucose, Bld: 95 mg/dL (ref 65–99)
Potassium: 4.1 mmol/L (ref 3.5–5.1)
Sodium: 138 mmol/L (ref 135–145)
Total Bilirubin: 0.5 mg/dL (ref 0.3–1.2)
Total Protein: 7.3 g/dL (ref 6.5–8.1)

## 2017-07-08 LAB — CBC
HCT: 37.9 % (ref 36.0–46.0)
Hemoglobin: 11.5 g/dL — ABNORMAL LOW (ref 12.0–15.0)
MCH: 24.1 pg — ABNORMAL LOW (ref 26.0–34.0)
MCHC: 30.3 g/dL (ref 30.0–36.0)
MCV: 79.5 fL (ref 78.0–100.0)
Platelets: 321 10*3/uL (ref 150–400)
RBC: 4.77 MIL/uL (ref 3.87–5.11)
RDW: 14.3 % (ref 11.5–15.5)
WBC: 6.5 10*3/uL (ref 4.0–10.5)

## 2017-07-08 LAB — APTT: aPTT: 35 seconds (ref 24–36)

## 2017-07-08 LAB — PROTIME-INR
INR: 1.01
Prothrombin Time: 13.2 seconds (ref 11.4–15.2)

## 2017-07-08 NOTE — Pre-Procedure Instructions (Addendum)
Candace Graham  07/08/2017      [id number] - [place], [place] [address] [place] [address] Phone: [phone] Fax: [phone]    Your procedure is scheduled on 07/10/17.  Report to [place] at [phone].  Call this number if you have problems the morning of surgery:  [phone]   Remember:  Do not eat food or drink liquids after midnight.  Take these medicines the morning of surgery with A SIP OF WATER        NONE  STOP all herbel meds, nsaids (aleve,naproxen,advil,ibuprofen)   prior to surgery starting today 07/08/17 including all vitamins/supplements,aspirin,fish oil, goodys,BC powders   Do not wear jewelry, make-up or nail polish.  Do not wear lotions, powders, or perfumes, or deoderant.  Do not shave 48 hours prior to surgery.  Men may shave face and neck.  Do not bring valuables to the hospital.  [facility] is not responsible for any belongings or valuables.  Contacts, dentures or bridgework may not be worn into surgery.  Leave your suitcase in the car.  After surgery it may be brought to your room.  For patients admitted to the hospital, discharge time will be determined by your treatment team.  Patients discharged the day of surgery will not be allowed to drive home.   Special instructions:   Special Instructions: [place] - Preparing for Surgery  Before surgery, you can play an important role.  Because skin is not sterile, your skin needs to be as free of germs as possible.  You can reduce the number of germs on you skin by washing with CHG (chlorahexidine gluconate) soap before surgery.  CHG is an antiseptic cleaner which kills germs and bonds with the skin to continue killing germs even after washing.  Please DO NOT use if you have an allergy to CHG or antibacterial soaps.  If your skin becomes reddened/irritated stop using the CHG and inform your nurse  when you arrive at Short Stay.  Do not shave (including legs and underarms) for at least 48 hours prior to the first CHG shower.  You may shave your face.  Please follow these instructions carefully:   1.  Shower with CHG Soap the night before surgery and the morning of Surgery.  2.  If you choose to wash your hair, wash your hair first as usual with your normal shampoo.  3.  After you shampoo, rinse your hair and body thoroughly to remove the Shampoo.  4.  Use CHG as you would any other liquid soap.  You can apply chg directly  to the skin and wash gently with scrungie or a clean washcloth.  5.  Apply the CHG Soap to your body ONLY FROM THE NECK DOWN.  Do not use on open wounds or open sores.  Avoid contact with your eyes ears, mouth and genitals (private parts).  Wash genitals (private parts)       with your normal soap.  6.  Wash thoroughly, paying special attention to the area where your surgery will be performed.  7.  Thoroughly rinse your body with warm water from the neck down.  8.  DO NOT shower/wash with your normal soap after using and rinsing off the CHG Soap.  9.  Pat yourself dry with a clean towel.            10.  Wear clean pajamas.  11.  Place clean sheets on your bed the night of your first shower and do not sleep with pets.  Day of Surgery  Do not apply any lotions/deodorants the morning of surgery.  Please wear clean clothes to the hospital/surgery center.  Please read over the fact sheets that you were given.

## 2017-07-09 ENCOUNTER — Telehealth: Payer: Self-pay | Admitting: Emergency Medicine

## 2017-07-09 NOTE — Telephone Encounter (Signed)
Spoke with Ria Comment she stated RB was contacted and everything has been taken care of. Nothing further is needed.

## 2017-07-10 ENCOUNTER — Encounter (HOSPITAL_COMMUNITY)
Admission: RE | Disposition: A | Discharge status: Home / Self Care | Payer: Self-pay | Source: Ambulatory Visit | Attending: Emergency Medicine

## 2017-07-10 ENCOUNTER — Other Ambulatory Visit (HOSPITAL_COMMUNITY)
Admission: RE | Admit: 2017-07-10 | Discharge: 2017-07-10 | Disposition: A | Discharge status: Home / Self Care | Payer: BLUE CROSS/BLUE SHIELD | Source: Ambulatory Visit | Attending: Emergency Medicine | Admitting: Emergency Medicine

## 2017-07-10 ENCOUNTER — Ambulatory Visit (HOSPITAL_COMMUNITY): Payer: BLUE CROSS/BLUE SHIELD

## 2017-07-10 ENCOUNTER — Ambulatory Visit (HOSPITAL_COMMUNITY): Payer: BLUE CROSS/BLUE SHIELD | Admitting: Certified Registered Nurse Anesthetist

## 2017-07-10 ENCOUNTER — Encounter (HOSPITAL_COMMUNITY): Payer: Self-pay | Admitting: *Deleted

## 2017-07-10 ENCOUNTER — Ambulatory Visit (HOSPITAL_COMMUNITY)
Admission: RE | Admit: 2017-07-10 | Discharge: 2017-07-10 | Disposition: A | Discharge status: Home / Self Care | Payer: BLUE CROSS/BLUE SHIELD | Source: Ambulatory Visit | Attending: Emergency Medicine | Admitting: Emergency Medicine

## 2017-07-10 DIAGNOSIS — R846 Abnormal cytological findings in specimens from respiratory organs and thorax: Secondary | ICD-10-CM | POA: Diagnosis not present

## 2017-07-10 DIAGNOSIS — Z09 Encounter for follow-up examination after completed treatment for conditions other than malignant neoplasm: Secondary | ICD-10-CM

## 2017-07-10 DIAGNOSIS — D509 Iron deficiency anemia, unspecified: Secondary | ICD-10-CM | POA: Diagnosis not present

## 2017-07-10 DIAGNOSIS — R918 Other nonspecific abnormal finding of lung field: Secondary | ICD-10-CM | POA: Insufficient documentation

## 2017-07-10 DIAGNOSIS — R848 Other abnormal findings in specimens from respiratory organs and thorax: Secondary | ICD-10-CM | POA: Diagnosis not present

## 2017-07-10 DIAGNOSIS — Z85528 Personal history of other malignant neoplasm of kidney: Secondary | ICD-10-CM | POA: Insufficient documentation

## 2017-07-10 DIAGNOSIS — D259 Leiomyoma of uterus, unspecified: Secondary | ICD-10-CM | POA: Diagnosis not present

## 2017-07-10 DIAGNOSIS — Z9889 Other specified postprocedural states: Secondary | ICD-10-CM

## 2017-07-10 DIAGNOSIS — C649 Malignant neoplasm of unspecified kidney, except renal pelvis: Secondary | ICD-10-CM | POA: Diagnosis not present

## 2017-07-10 DIAGNOSIS — J984 Other disorders of lung: Secondary | ICD-10-CM | POA: Diagnosis not present

## 2017-07-10 HISTORY — PX: VIDEO BRONCHOSCOPY WITH ENDOBRONCHIAL NAVIGATION: SHX6175

## 2017-07-10 SURGERY — VIDEO BRONCHOSCOPY WITH ENDOBRONCHIAL NAVIGATION
Anesthesia: General | Site: Chest

## 2017-07-10 MED ORDER — SUCCINYLCHOLINE CHLORIDE 200 MG/10ML IV SOSY
PREFILLED_SYRINGE | INTRAVENOUS | Status: AC
  Filled 2017-07-10: qty 10

## 2017-07-10 MED ORDER — ROCURONIUM BROMIDE 100 MG/10ML IV SOLN
INTRAVENOUS | Status: DC | PRN
  Administered 2017-07-10: 10 mg via INTRAVENOUS
  Administered 2017-07-10: 50 mg via INTRAVENOUS
  Administered 2017-07-10: 10 mg via INTRAVENOUS

## 2017-07-10 MED ORDER — OXYCODONE HCL 5 MG/5ML PO SOLN: 5.0000 mg | Freq: Once | ORAL | Status: DC | PRN

## 2017-07-10 MED ORDER — 0.9 % SODIUM CHLORIDE (POUR BTL) OPTIME
TOPICAL | Status: DC | PRN
  Administered 2017-07-10: 1000 mL

## 2017-07-10 MED ORDER — DEXAMETHASONE SODIUM PHOSPHATE 10 MG/ML IJ SOLN
INTRAMUSCULAR | Status: DC | PRN
  Administered 2017-07-10: 10 mg via INTRAVENOUS

## 2017-07-10 MED ORDER — OXYCODONE HCL 5 MG PO TABS: 5.0000 mg | ORAL_TABLET | Freq: Once | ORAL | Status: DC | PRN

## 2017-07-10 MED ORDER — LACTATED RINGERS IV SOLN
INTRAVENOUS | Status: DC
  Administered 2017-07-10: 08:00:00 via INTRAVENOUS

## 2017-07-10 MED ORDER — SUGAMMADEX SODIUM 200 MG/2ML IV SOLN
INTRAVENOUS | Status: DC | PRN
  Administered 2017-07-10: 200 mg via INTRAVENOUS

## 2017-07-10 MED ORDER — PROPOFOL 10 MG/ML IV BOLUS
INTRAVENOUS | Status: DC | PRN
  Administered 2017-07-10: 180 mg via INTRAVENOUS

## 2017-07-10 MED ORDER — ONDANSETRON HCL 4 MG/2ML IJ SOLN
INTRAMUSCULAR | Status: DC | PRN
  Administered 2017-07-10: 4 mg via INTRAVENOUS

## 2017-07-10 MED ORDER — ONDANSETRON HCL 4 MG/2ML IJ SOLN: 4.0000 mg | Freq: Once | INTRAMUSCULAR | Status: DC | PRN

## 2017-07-10 MED ORDER — SODIUM CHLORIDE 0.9 % IV SOLN
0.0000 ug/min | INTRAVENOUS | Status: DC
  Filled 2017-07-10: qty 1

## 2017-07-10 MED ORDER — MIDAZOLAM HCL 2 MG/2ML IJ SOLN
INTRAMUSCULAR | Status: AC
  Filled 2017-07-10: qty 2

## 2017-07-10 MED ORDER — PROPOFOL 10 MG/ML IV BOLUS
INTRAVENOUS | Status: AC
  Filled 2017-07-10: qty 20

## 2017-07-10 MED ORDER — FENTANYL CITRATE (PF) 100 MCG/2ML IJ SOLN: 25.0000 ug | INTRAMUSCULAR | Status: DC | PRN

## 2017-07-10 MED ORDER — ONDANSETRON HCL 4 MG/2ML IJ SOLN
INTRAMUSCULAR | Status: AC
  Filled 2017-07-10: qty 2

## 2017-07-10 MED ORDER — MIDAZOLAM HCL 5 MG/5ML IJ SOLN
INTRAMUSCULAR | Status: DC | PRN
  Administered 2017-07-10: 2 mg via INTRAVENOUS

## 2017-07-10 MED ORDER — LIDOCAINE HCL (CARDIAC) 20 MG/ML IV SOLN
INTRAVENOUS | Status: DC | PRN
  Administered 2017-07-10: 60 mg via INTRAVENOUS

## 2017-07-10 MED ORDER — PHENYLEPHRINE HCL 10 MG/ML IJ SOLN
INTRAVENOUS | Status: DC | PRN
  Administered 2017-07-10: 20 ug/min via INTRAVENOUS

## 2017-07-10 MED ORDER — FENTANYL CITRATE (PF) 250 MCG/5ML IJ SOLN
INTRAMUSCULAR | Status: AC
  Filled 2017-07-10: qty 5

## 2017-07-10 MED ORDER — FENTANYL CITRATE (PF) 100 MCG/2ML IJ SOLN
INTRAMUSCULAR | Status: DC | PRN
  Administered 2017-07-10: 100 ug via INTRAVENOUS

## 2017-07-10 SURGICAL SUPPLY — 38 items
ADAPTER BRONCH F/PENTAX (ADAPTER) ×4 IMPLANT
BRUSH CYTOL CELLEBRITY 1.5X140 (MISCELLANEOUS) ×2 IMPLANT
BRUSH SUPERTRAX BIOPSY (INSTRUMENTS) IMPLANT
BRUSH SUPERTRAX NDL-TIP CYTO (INSTRUMENTS) ×4 IMPLANT
CANISTER SUCT 3000ML PPV (MISCELLANEOUS) ×2 IMPLANT
CHANNEL WORK EXTEND EDGE 180 (KITS) IMPLANT
CHANNEL WORK EXTEND EDGE 45 (KITS) IMPLANT
CHANNEL WORK EXTEND EDGE 90 (KITS) IMPLANT
CONT SPEC 4OZ CLIKSEAL STRL BL (MISCELLANEOUS) ×8 IMPLANT
COVER BACK TABLE 60X90IN (DRAPES) ×2 IMPLANT
FILTER STRAW FLUID ASPIR (MISCELLANEOUS) IMPLANT
FORCEPS BIOP SUPERTRX PREMAR (INSTRUMENTS) ×4 IMPLANT
GAUZE SPONGE 4X4 12PLY STRL (GAUZE/BANDAGES/DRESSINGS) ×2 IMPLANT
GLOVE BIO SURGEON STRL SZ7.5 (GLOVE) ×4 IMPLANT
GOWN STRL REUS W/ TWL LRG LVL3 (GOWN DISPOSABLE) ×2 IMPLANT
GOWN STRL REUS W/TWL LRG LVL3 (GOWN DISPOSABLE) ×2
KIT CLEAN ENDO COMPLIANCE (KITS) ×2 IMPLANT
KIT LOCATABLE GUIDE (CANNULA) IMPLANT
KIT MARKER FIDUCIAL DELIVERY (KITS) IMPLANT
KIT PROCEDURE EDGE 180 (KITS) ×2 IMPLANT
KIT PROCEDURE EDGE 45 (KITS) IMPLANT
KIT PROCEDURE EDGE 90 (KITS) IMPLANT
KIT ROOM TURNOVER OR (KITS) ×2 IMPLANT
MARKER SKIN DUAL TIP RULER LAB (MISCELLANEOUS) ×2 IMPLANT
NEEDLE SUPERTRX PREMARK BIOPSY (NEEDLE) ×2 IMPLANT
NS IRRIG 1000ML POUR BTL (IV SOLUTION) ×2 IMPLANT
OIL SILICONE PENTAX (PARTS (SERVICE/REPAIRS)) ×2 IMPLANT
PAD ARMBOARD 7.5X6 YLW CONV (MISCELLANEOUS) ×4 IMPLANT
PATCHES PATIENT (LABEL) ×2 IMPLANT
SYR 20CC LL (SYRINGE) ×2 IMPLANT
SYR 20ML ECCENTRIC (SYRINGE) ×2 IMPLANT
SYR 50ML SLIP (SYRINGE) ×2 IMPLANT
SYSTEM GENCUT CORE BIOPSY (NEEDLE) ×2 IMPLANT
TOWEL OR 17X24 6PK STRL BLUE (TOWEL DISPOSABLE) ×2 IMPLANT
TRAP SPECIMEN MUCOUS 40CC (MISCELLANEOUS) ×2 IMPLANT
TUBE CONNECTING 20X1/4 (TUBING) ×2 IMPLANT
UNDERPAD 30X30 (UNDERPADS AND DIAPERS) ×2 IMPLANT
WATER STERILE IRR 1000ML POUR (IV SOLUTION) ×2 IMPLANT

## 2017-07-10 NOTE — Anesthesia Procedure Notes (Signed)
Procedure Name: Intubation Date/Time: 07/10/2017 10:28 AM Performed by: Carney Living Pre-anesthesia Checklist: Patient identified, Emergency Drugs available, Suction available, Patient being monitored and Timeout performed Patient Re-evaluated:Patient Re-evaluated prior to induction Oxygen Delivery Method: Circle system utilized Preoxygenation: Pre-oxygenation with 100% oxygen Induction Type: IV induction Ventilation: Mask ventilation without difficulty Laryngoscope Size: Mac and 4 Grade View: Grade I Tube type: Oral Tube size: 8.5 mm Airway Equipment and Method: Stylet Placement Confirmation: ETT inserted through vocal cords under direct vision,  positive ETCO2 and breath sounds checked- equal and bilateral Secured at: 21 cm Tube secured with: Tape Dental Injury: Teeth and Oropharynx as per pre-operative assessment

## 2017-07-10 NOTE — Op Note (Signed)
Video Bronchoscopy with Electromagnetic Navigation Procedure Note  Date of Operation: 07/10/2017  Pre-op Diagnosis: bilateral pulmonary nodules  Post-op Diagnosis: same  Surgeon: Baltazar Apo  Assistants: none  Anesthesia: General endotracheal anesthesia  Operation: Flexible video fiberoptic bronchoscopy with electromagnetic navigation and biopsies.  Estimated Blood Loss: Minimal  Complications: none apparent  Indications and History: Candace Graham is a 46 y.o. female with hx of renal cell carcinoma. She was found to have bilateral pulmonary nodules. TTNA and ENB have been non-diagnostic. The nodules have persisted and enlarged slightly. Recommendation was made to perform repeat ENB and biopsies to achieve a tissue dx..  The risks, benefits, complications, treatment options and expected outcomes were discussed with the patient.  The possibilities of pneumothorax, pneumonia, reaction to medication, pulmonary aspiration, perforation of a viscus, bleeding, failure to diagnose a condition and creating a complication requiring transfusion or operation were discussed with the patient who freely signed the consent.    Description of Procedure: The patient was seen in the Preoperative Area, was examined and was deemed appropriate to proceed.  The patient was taken to OR10, identified as Candace Graham and the procedure verified as Flexible Video Fiberoptic Bronchoscopy.  A Time Out was held and the above information confirmed.   Prior to the date of the procedure a high-resolution CT scan of the chest was performed. Utilizing [place] a virtual tracheobronchial tree was generated to allow the creation of distinct navigation pathways to the patient's bilateral parenchymal abnormalities. After being taken to the operating room general anesthesia was initiated and the patient  was orally intubated. The video fiberoptic bronchoscope was introduced via the endotracheal tube and  a general inspection was performed which showed normal airways throughout. The extendable working channel and locator guide were introduced into the bronchoscope. The distinct navigation pathways prepared prior to this procedure were then utilized to navigate to within 0.4 - 0.8 cm of patient's RML (target 1) and LUL (target 5) identified on CT scan. The extendable working channel was secured into place and the locator guide was withdrawn. Under fluoroscopic guidance transbronchial needle brushings and transbronchial forceps biopsies were performed to be sent for cytology and pathology at target 1 and target 5. Gen-Cut aspiration biopsies were obtained at target 1 in the RML. A bronchioalveolar lavage was performed in the RML and sent for cytology and microbiology (bacterial, fungal, AFB smears and cultures). At the end of the procedure a general airway inspection was performed and there was no evidence of active bleeding. The bronchoscope was removed.  The patient tolerated the procedure well. There was no significant blood loss and there were no obvious complications. A post-procedural chest x-ray is pending.  Samples: 1. Transbronchial needle brushings from RML and LUL nodules 2. Transbronchial Gen-cut biopsies from RML 3. Transbronchial forceps biopsies from RML and LUL nodules 4. Bronchoalveolar lavage from RML  Plans:  The patient will be discharged from the PACU to home when recovered from anesthesia and after chest x-ray is reviewed. We will review the cytology, pathology and microbiology results with the patient when they become available. Outpatient followup will be with Dr Lamonte Sakai.    Baltazar Apo, MD, PhD 07/10/2017, 12:37 PM [facility] [phone] or if no answer [phone]

## 2017-07-10 NOTE — Anesthesia Preprocedure Evaluation (Addendum)
Anesthesia Evaluation  Patient identified by MRN, date of birth, ID band Patient awake    Reviewed: Allergy & Precautions, NPO status , Patient's Chart, lab work & pertinent test results  Airway Mallampati: II  TM Distance: >3 FB Neck ROM: Full    Dental  (+) Teeth Intact, Dental Advisory Given   Pulmonary    breath sounds clear to auscultation       Cardiovascular  Rhythm:Regular Rate:Normal     Neuro/Psych    GI/Hepatic   Endo/Other    Renal/GU Renal disease     Musculoskeletal   Abdominal   Peds  Hematology  (+) anemia ,   Anesthesia Other Findings   Reproductive/Obstetrics                            Anesthesia Physical Anesthesia Plan  ASA: III  Anesthesia Plan: General   Post-op Pain Management:    Induction: Intravenous  PONV Risk Score and Plan: Ondansetron and Dexamethasone  Airway Management Planned: Oral ETT  Additional Equipment:   Intra-op Plan:   Post-operative Plan: Extubation in OR  Informed Consent: I have reviewed the patients History and Physical, chart, labs and discussed the procedure including the risks, benefits and alternatives for the proposed anesthesia with the patient or authorized representative who has indicated his/her understanding and acceptance.   Dental advisory given  Plan Discussed with: CRNA, Anesthesiologist and Surgeon  Anesthesia Plan Comments:        Anesthesia Quick Evaluation

## 2017-07-10 NOTE — Discharge Instructions (Signed)
Flexible Bronchoscopy, Care After These instructions give you information on caring for yourself after your procedure. Your doctor may also give you more specific instructions. Call your doctor if you have any problems or questions after your procedure. Follow these instructions at home:  Do not eat or drink anything for 2 hours after your procedure. If you try to eat or drink before the medicine wears off, food or drink could go into your lungs. You could also burn yourself.  After 2 hours have passed and when you can cough and gag normally, you may eat soft food and drink liquids slowly.  The day after the test, you may eat your normal diet.  You may do your normal activities.  Keep all doctor visits. Get help right away if:  You get more and more short of breath.  You get light-headed.  You feel like you are going to pass out (faint).  You have chest pain.  You have new problems that worry you.  You cough up more than a little blood.  You cough up more blood than before. This information is not intended to replace advice given to you by your health care provider. Make sure you discuss any questions you have with your health care provider. Document Released: 07/29/2009 Document Revised: 03/08/2016 Document Reviewed: 06/05/2013 Elsevier Interactive Patient Education  2017 [place] CALL OUR OFFICE FOR ANY QUESTIONS OR CONCERNS [phone].

## 2017-07-10 NOTE — H&P (View-Only) (Signed)
Subjective:    Patient ID: Candace Graham, female    DOB: 02/11/1971, 46 y.o.   MRN: [id number]  HPI 46 year old woman never smoker who has been diagnosed with L renal cell carcinoma. She has been seen by Dr Chase Caller for B rounded pulmonary nodules, one of which has mild hypermetabolic on PET from 05/18/68, remains present on CT chest from 08/24/16. She is here today to discuss possible biopsy. She had a L partial renal resection 07/27/16 with clear margins for the renal cell CA. Also underwent lung TTNA on 06/12/16 >> non-diagnostic.   She reports that she is asymptomatic. She does have post-nasal gtt and some cough.   Pt has a lot of questions regarding all of the test results to date, the pre-test probability of malignancy, the risks / benefits of FOB + biopsies.   ROV 10/23/16 -- This follow-up visit for bilateral pulmonary nodules noted on CT scan of the chest and PET scan as outlined above. She underwent navigational bronchoscopy on 09/12/16. The samples were negative with the exception of rare atypical cells seen on right middle lobe needle brushing. Also noted was mild atypia on transbronchial biopsies from each nodule. This was not diagnostic of malignancy. AFB and fungal smears were negative. Fungal culture was negative. AFB culture is not completed.   ROV 05/21/17 -- Candace Graham has a history of left renal cell carcinoma and bilateral rounded pulmonary nodules. We performed navigational bronchoscopy 09/12/16 - atypical cells noted, no evidence for malignancy or atypical infection. TTNA was negative in 05/2016.  She has not had a repeat CT scan of the chest and abdomen yet. She still has some L low back pain that is stable. She is having some exertional SOB with stairs, doesn't need to stop. She has gained 20 lbs since last time. No new cough. No CP.   ROV 06/13/17 -- This is a follow-up visit for patient with a history of bilateral round pulmonary nodules in the setting of left renal cell  carcinoma. Navigational bronchoscopy 09/12/16 was unrevealing, as was TTNA 05/2016. We have repeated her CT scan of the chest and abdomen on 05/24/17 that I personally reviewed. This shows slight enlargement of the nodules, no disease below the diaphragm. She continues to feel the same. Denies fevers, cough, sputum.   ROV 06/27/17 -- this is a follow-up visit for abnormal CT scan of the chest in a patient with a history of renal cell carcinoma. Attempted biopsies by TTNA and also navigational bronchoscopy, all cytologies negative so far. By imaging it looks like an embolic process. I checked an echocardiogram done on 06/26/17 that showed no evidence of endocarditis. PET scan performed on 06/27/17 was reviewed by me. This shows multiple bilateral pulmonary nodules, dominant lesion 2.0 x 1.9 in the right middle lobe, very minimal FDG uptake ( SUV max equal 2.1). A 10 mm left upper lobe nodule has SUV max 2.2. She is feeling no different.   Rarely, metastatic lesions from renal cell, prostate, or testicular cancers may be devoid of detectable amounts of FDG    Review of Systems As above.     Objective:   Physical Exam Vitals:   06/27/17 1138  BP: 118/74  Pulse: 78  SpO2: 98%  Weight: 229 lb (103.9 kg)  Height: 5\' 7"  (1.702 m)   Gen: Pleasant, overwt woman, in no distress,  normal affect  ENT: No lesions,  mouth clear,  oropharynx clear, no postnasal drip  Neck: No JVD, no TMG, no carotid bruits  Lungs: No use of accessory muscles, clear without rales or rhonchi  Cardiovascular: RRR, heart sounds normal, no murmur or gallops, no peripheral edema  Musculoskeletal: No deformities, no cyanosis or clubbing  Neuro: alert, non focal  Skin: Warm, no lesions or rashes       Assessment & Plan:  Multiple lung nodules PET scan shows only minimal hypermetabolism in her bilateral nodules. This would be reassuring but rarely under some circumstances renal cell carcinoma does not light up. Still  worried about metastatic disease. I think she needs navigational bronchoscopy now. If unrevealing then I will ask for a nodule to be resected for histopathology.   30 minutes of a 40 minute appointment spent counseling, discussing possible diagnoses, discussing possible diagnostic testing   Baltazar Apo, MD, PhD 06/27/2017, 12:15 PM [facility] [phone] or if no answer [phone]

## 2017-07-10 NOTE — Transfer of Care (Signed)
Immediate Anesthesia Transfer of Care Note  Patient: Candace Graham  Procedure(s) Performed: Procedure(s): VIDEO BRONCHOSCOPY WITH ENDOBRONCHIAL NAVIGATION (N/A)  Patient Location: PACU  Anesthesia Type:General  Level of Consciousness: awake, alert , oriented and patient cooperative  Airway & Oxygen Therapy: Patient Spontanous Breathing and Patient connected to nasal cannula oxygen  Post-op Assessment: Report given to RN, Post -op Vital signs reviewed and stable and Patient moving all extremities X 4  Post vital signs: Reviewed and stable  Last Vitals:  Vitals:   07/10/17 0821  BP: (!) 141/95  Pulse: 90  Resp: 18  Temp: 37.1 C  SpO2: 99%    Last Pain:  Vitals:   07/10/17 0821  TempSrc: Oral         Complications: No apparent anesthesia complications

## 2017-07-10 NOTE — Interval H&P Note (Signed)
PCCM Interval Note  Pt presents today for further eval of her bilateral pulmonary nodules. These are only mildly hypermetabolic on PET but still suspicious for malignancy. prior TTNA and ENB have been non-diagnostic. I have recommended repeat ENB as next step. She understands the risks and benefits, agrees with the plan.   No new issues reported today except for some increased sinus drainage and some increase productive cough. No fevers, chills, CP.   Vitals:   07/10/17 0817 07/10/17 0821  BP:  (!) 141/95  Pulse:  90  Resp:  18  Temp:  98.7 F (37.1 C)  TempSrc:  Oral  SpO2:  99%  Weight: 103.4 kg (228 lb)   Height: 5\' 7"  (1.702 m)    Gen: Pleasant, overwt woman, in no distress,  normal affect  ENT: No lesions,  mouth clear,  oropharynx clear, no postnasal drip  Neck: No JVD, no stridor  Lungs: No use of accessory muscles, clear without rales or rhonchi  Cardiovascular: RRR, heart sounds normal, no murmur or gallops, no peripheral edema  Musculoskeletal: No deformities, no cyanosis or clubbing  Neuro: alert, non focal  Skin: Warm, no lesions or rashes   Plans:  We will proceed with ENB and nodule biopsies. If these are non-diagnostic then we will need to consider a surgical biopsy.   Baltazar Apo, MD, PhD 07/10/2017, 9:56 AM [facility] [phone] or if no answer [phone]

## 2017-07-10 NOTE — Anesthesia Postprocedure Evaluation (Signed)
Anesthesia Post Note  Patient: Candace Graham  Procedure(s) Performed: Procedure(s) (LRB): VIDEO BRONCHOSCOPY WITH ENDOBRONCHIAL NAVIGATION (N/A)     Patient location during evaluation: PACU Anesthesia Type: General Level of consciousness: awake, awake and alert and oriented Pain management: pain level controlled Vital Signs Assessment: post-procedure vital signs reviewed and stable Respiratory status: spontaneous breathing, nonlabored ventilation and respiratory function stable Cardiovascular status: blood pressure returned to baseline Postop Assessment: no headache Anesthetic complications: no    Last Vitals:  Vitals:   07/10/17 1345 07/10/17 1400  BP: 109/65 112/64  Pulse: 72 74  Resp: 16 18  Temp:  36.7 C  SpO2: 96% 98%    Last Pain:  Vitals:   07/10/17 1400  TempSrc:   PainSc: 0-No pain                 Kadesia Robel COKER

## 2017-07-11 ENCOUNTER — Encounter (HOSPITAL_COMMUNITY): Payer: Self-pay | Admitting: Emergency Medicine

## 2017-07-11 LAB — ACID FAST SMEAR (AFB, MYCOBACTERIA): Acid Fast Smear: NEGATIVE

## 2017-07-12 ENCOUNTER — Telehealth: Payer: Self-pay | Admitting: Emergency Medicine

## 2017-07-12 LAB — CULTURE, RESPIRATORY W GRAM STAIN

## 2017-07-12 NOTE — Telephone Encounter (Signed)
Please call the patient and let her know that I spoke with the pathologist about her biopsy results. They are going to do some special stains on the material to better characterize. The results probably won't be ready until middle next week. Thanks.

## 2017-07-12 NOTE — Telephone Encounter (Signed)
LVM on machine for patient to return call regarding request of RB. Will attempt to call back patient again.

## 2017-07-12 NOTE — Telephone Encounter (Signed)
Spoke with patient. She is aware of RB's message. Verbalized understanding. Nothing else needed at time of call.

## 2017-07-12 NOTE — Telephone Encounter (Signed)
Patient returned call, CB is [phone].

## 2017-07-18 ENCOUNTER — Telehealth: Payer: Self-pay | Admitting: Emergency Medicine

## 2017-07-18 NOTE — Telephone Encounter (Signed)
Please let her know that her bronchoscopy results are back in part. All of her cytology collected is negative for cancer. The biopsies (pathology report) are still pending. We need this information to know how to go forward. I am following up with the pathology department. Just wanted her to know the current status.

## 2017-07-19 NOTE — Telephone Encounter (Signed)
Spoke with pt. She is aware of her results. Will continue to await the pathology report. Nothing further was needed.

## 2017-07-29 ENCOUNTER — Telehealth: Payer: Self-pay | Admitting: Emergency Medicine

## 2017-07-29 DIAGNOSIS — R918 Other nonspecific abnormal finding of lung field: Secondary | ICD-10-CM

## 2017-07-29 DIAGNOSIS — E559 Vitamin D deficiency, unspecified: Secondary | ICD-10-CM | POA: Diagnosis not present

## 2017-07-29 DIAGNOSIS — D509 Iron deficiency anemia, unspecified: Secondary | ICD-10-CM | POA: Diagnosis not present

## 2017-07-29 DIAGNOSIS — R5383 Other fatigue: Secondary | ICD-10-CM | POA: Diagnosis not present

## 2017-07-29 NOTE — Telephone Encounter (Signed)
Spoke with pathology department. The TBBx's from 07/10/17 have been sent to [facility] for over-read. Nothing right now to support Renal cell ca. Will continue to follow. I suspect that she will need a nodule resection via VATS. I will refer to [facility] to obtain a better sample.

## 2017-07-30 ENCOUNTER — Ambulatory Visit: Payer: BLUE CROSS/BLUE SHIELD | Admitting: Emergency Medicine

## 2017-08-02 NOTE — Addendum Note (Signed)
Addended by: Desmond Dike C on: 08/02/2017 01:59 PM   Modules accepted: Orders

## 2017-08-02 NOTE — Telephone Encounter (Signed)
Per RB >> please refer pt to [facility] for evaluation.  Referral has been placed per RB. Nothing further was needed.

## 2017-08-15 ENCOUNTER — Other Ambulatory Visit: Payer: Self-pay | Admitting: *Deleted

## 2017-08-15 ENCOUNTER — Encounter: Payer: Self-pay | Admitting: Cardiothoracic Surgery

## 2017-08-15 ENCOUNTER — Institutional Professional Consult (permissible substitution) (INDEPENDENT_AMBULATORY_CARE_PROVIDER_SITE_OTHER): Payer: BLUE CROSS/BLUE SHIELD | Admitting: Cardiothoracic Surgery

## 2017-08-15 VITALS — BP 110/76 | HR 71 | Resp 16 | Ht 67.0 in | Wt 227.0 lb

## 2017-08-15 DIAGNOSIS — R918 Other nonspecific abnormal finding of lung field: Secondary | ICD-10-CM

## 2017-08-15 NOTE — Progress Notes (Signed)
[address]       [place] [address]             [phone]                    [name] Medical Record #[id number] Date of Birth: Oct 11, 1971  Referring: Collene Gobble, MD Primary Care: Donald Prose, MD  Chief Complaint:    Chief Complaint  Patient presents with  . Lung Lesion    multiple...TTNA/EBUS NON DX/NEG..CT CHEST SUPER-D 9/17, PET 06/27/17    History of Present Illness:    Candace Graham 46 y.o. female is seen in the office  today for history of multiple bilateral pulmonary nodules.  She has a known history of renal cell carcinoma, with partial left nephrectomy done approximately 1 year ago.  Stage is not reported. She had needle bx and ENB  Twice in the past, last enb last month with  Atypia . 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      Cancer Staging No matching staging information was found for the patient.   Current Activity/ Functional Status:  Patient is independent with mobility/ambulation, transfers, ADL's, IADL's.   Zubrod Score: At the time of surgery this patient's most appropriate activity status/level should be described as: [x]     0    Normal activity, no symptoms []     1    Restricted in physical strenuous activity but ambulatory, able to do out light work []     2    Ambulatory and capable of self care, unable to do work activities, up and about               >50 % of waking hours                              []     3    Only limited self care, in bed greater than 50% of waking hours []     4    Completely disabled, no self care, confined to bed or chair []     5    Moribund   Past Medical History:  Diagnosis Date  . Abnormal uterine bleeding    Heavy menstrual cycles  . Anemia    hx  . Cancer (HCC)    renal cell carcinoma  . Fibroid tumor   . Vaginal delivery 1991, 1999  . Varicose veins of left lower extremity    varicose veins    Past Surgical History:    Procedure Laterality Date  . LUNG BIOPSY    . uterine polyp removal      Family History  Problem Relation Age of Onset  . Pancreatic cancer Mother   . Hypertension Mother   . Stroke Mother   . Thyroid disease Mother   . Hypertension Brother   . Hypertension Brother   . Heart disease Father   . Arthritis Father   . Alcohol abuse Maternal Grandmother   . Esophageal cancer Maternal Grandmother   . Drug abuse Maternal Grandfather     Social History   Socioeconomic History  . Marital status: Single    Spouse name: Not on file  . Number of children: 2  . Years of education: 107  . Highest education level: Not on file  Social Needs  . Financial resource strain: Not on file  . Food insecurity -  worry: Not on file  . Food insecurity - inability: Not on file  . Transportation needs - medical: Not on file  . Transportation needs - non-medical: Not on file  Occupational History  . Not on file  Tobacco Use  . Smoking status: Never Smoker  . Smokeless tobacco: Never Used  Substance and Sexual Activity  . Alcohol use: No  . Drug use: No  . Sexual activity: Yes    Partners: Male    Birth control/protection: Post-menopausal  Other Topics Concern  . Not on file  Social History Narrative   Fun: Travel, working with sons.   Denies religious beliefs effecting health care.    Denies abuse and feels safe at home.     Social History   Tobacco Use  Smoking Status Never Smoker  Smokeless Tobacco Never Used    Social History   Substance and Sexual Activity  Alcohol Use No     No Known Allergies  Current Outpatient Medications  Medication Sig Dispense Refill  . Cholecalciferol (VITAMIN D) 2000 units CAPS Take 2,000 Units by mouth daily.     No current facility-administered medications for this visit.     Pertinent items are noted in HPI.   Review of Systems:     Cardiac Review of Systems: Y or N  Chest Pain [ n   ]  Resting SOB [ n] Exertional SOB  [ n ]   Cardell Peach ]  General Review of Systems: [Y] = yes [  ]=no Constitional: recent weight change [  ];  Wt loss over the last 3 months [   ] anorexia [  ]; fatigue [  ]; nausea [  ]; night sweats [  ]; fever [  ]; or chills [  ];          Dental: poor dentition[  ]; Last Dentist visit:   Eye : blurred vision [  ]; diplopia [ n ]; vision changes [nmaurosis fugax[  n; Resp: cough [n;  wheezing[ n ];  dysphagia[n ]; melenan ];  hematochezia [  ]; heartburn[  ];   Hx of  Colonoscopy[  ]; GU: kidney stones [  ]; hematuria[n];   dysuria [ n ];  nocturia[  ];  history of     obstruction [  ]; urinary frequency [  ]             Skin: rash, swelling[  ];, hair loss[  ];  peripheral edema[  ];  or itching[  ]; Musculosketetal: myalgias[  ];  joint swelling[  ];  joint erythema[  ];  joint pain[  ];  back pain[  ];  Heme/Lymph: bruising[  ];  bleeding[  ];  anemia[  ];  Neuro: TIA[ n ];  headaches[  ];  stroke[  ];  vertigo[  ];  seizures[  ];   paresthesias[ n];  difficulty walking[n ];  Psych:depression[  ]; anxiety[  ];  Endocrine: diabetes[  ];  thyroid dysfunction[  ];  Immunizations: Flu up to date [ n ]; Pneumococcal up to date n  ];  Other:  Physical Exam: BP 110/76 (BP Location: Left Arm, Patient Position: Sitting, Cuff Size: Large)   Pulse 71   Resp 16   Ht 5\' 7"  (1.702 m)   Wt 227 lb (103 kg)   LMP 08/15/2014   SpO2 98% Comment: ON RA  BMI 35.55 kg/m   PHYSICAL EXAMINATION: General appearance: alert and cooperative Head: Normocephalic, without obvious abnormality,  atraumatic Neck: no adenopathy, no carotid bruit, no JVD, supple, symmetrical, trachea midline and thyroid not enlarged, symmetric, no tenderness/mass/nodules Lymph nodes: Cervical, supraclavicular, and axillary nodes normal. Resp: clear to auscultation bilaterally Back: symmetric, no curvature. ROM normal. No CVA tenderness. Cardio: regular rate and rhythm, S1, S2 normal, no murmur, click, rub or gallop GI: soft,  non-tender; bowel sounds normal; no masses,  no organomegaly Extremities: extremities normal, atraumatic, no cyanosis or edema and Homans sign is negative, no sign of DVT Neurologic: Grossly normal  Diagnostic Studies & Laboratory data:     Recent Radiology Findings:  CLINICAL DATA:  Subsequent treatment strategy for enlarging lung nodules and patient with a history of renal cell carcinoma. Is the.  EXAM: NUCLEAR MEDICINE PET SKULL BASE TO THIGH  TECHNIQUE: 12.8 mCi F-18 FDG was injected intravenously. Full-ring PET imaging was performed from the skull base to thigh after the radiotracer. CT data was obtained and used for attenuation correction and anatomic localization.  FASTING BLOOD GLUCOSE:  Value: 114 mg/dl  COMPARISON:  06/20/2016 he with  FINDINGS: NECK: No hypermetabolic lymph nodes in the neck.  CHEST: Multiple bilateral pulmonary nodules are evident as on multiple previous imaging studies. The dominant lesion is in the posterior right middle lobe (image 37 series 8 today) and measures 2.0 x 1.9 cm, progressed since prior CT of 06/20/2016 when it measured 1.6 x 1.7 cm. Despite the size, only very low level FDG accumulation is evident within the nodule ( SUV max = 2.1). Multiple other bilateral pulmonary nodules of varying size ranging from several mm up to 07-2014 mm. A 10 mm posterior left upper lobe nodule (image 23 series 8) demonstrates SUV max = 2.2.  Heart size is upper normal.  ABDOMEN/PELVIS: No abnormal hypermetabolic activity within the liver, pancreas, adrenal glands, or spleen. No hypermetabolic lymph nodes in the abdomen or pelvis.  SKELETON: No focal hypermetabolic activity to suggest skeletal metastasis.  IMPRESSION: 1. Multiple bilateral pulmonary nodules seen to be progressive on recent diagnostic CT of 05/24/2017. These lesions are not substantially hypermetabolic and discernible FDG uptake is really only seen in the dominant  posterior right middle lobe 2 cm nodule, demonstrating uptake level borderline between infectious/inflammatory and neoplastic etiology. Given back these nodules appeared progressive on the diagnostic CT, poorly FDG avid metastatic disease cannot be excluded. Tissue sampling may be warranted. 2. No other unexpected hypermetabolic disease in the neck, chest, abdomen, or pelvis.   Electronically Signed   By: Misty Stanley M.D.   On: 06/27/2017 11:21 I have independently reviewed the above radiologic studies.  Recent Lab Findings: Lab Results  Component Value Date   WBC 6.5 07/08/2017   HGB 11.5 (L) 07/08/2017   HCT 37.9 07/08/2017   PLT 321 07/08/2017   GLUCOSE 95 07/08/2017   CHOL 162 03/21/2017   TRIG 92 03/21/2017   HDL 43 03/21/2017   LDLCALC 101 (H) 03/21/2017   ALT 24 07/08/2017   AST 24 07/08/2017   NA 138 07/08/2017   K 4.1 07/08/2017   CL 107 07/08/2017   CREATININE 0.70 07/08/2017   BUN 8 07/08/2017   CO2 28 07/08/2017   TSH 2.100 03/21/2017   INR 1.01 07/08/2017   HGBA1C 6.0 (H) 03/17/2015   Results of supra D navigation bronchoscopy pulmonary   diagnosis 1. Lung, biopsy, Right Middle Lobe LUNG PARENCHYMA WITH CHRONIC INFLAMMATION NEGATIVE FOR MALIGNANCY 2. Lung, biopsy, Left Upper Lobe LUNG PARENCHYMA WITH LYMPHOPLASMACYTIC INFILTRATE NEGATIVE FOR MALIGNANCY 3. Lung, biopsy, Right middle LUNG  PARENCHYMA WITH LYMPHOPLASMACYTIC INFILTRATE NEGATIVE FOR MALIGNANCY 4. Lung, biopsy, Right middle #2 LUNG PARENCHYMA WITH LYMPHOPLASMACYTIC INFILTRATE NEGATIVE FOR MALIGNANCY Microscopic Comment 3. Immunostains for cytokeratin AE1/3, ck8/18, cytokeratin 7, pax 8, s100, cd1a, cd68 demonstrated negative for metastatic renal cell carcinoma. This case has been consulted with [facility]. Tedra Senegal [facility]. Casimer Lanius MD   Assessment / Plan:   Patient with history of renal cell carcinoma resected one year ago, with bilateral pulmonary nodules  suggestive of malignancy.  2 previous attempts at enb have not made a definitive diagnosis.  With the nodules slightly increasing in size I recommended to the patient before considering any surgery to obtain a tissue diagnosis that a repeat CT-guided needle biopsy be performed.  Any surgical intervention would be for diagnostic purposes only and not therapeutic with the bilateral nature of the nodules .  Patient was discussed at the [facility] conference, and reviewed with radiology to proceed with ct guided needle bx again.    I  spent 40 minutes counseling the patient face to face and 50% or more the  time was spent in counseling and coordination of care. The total time spent in the appointment was 60 minutes.  Grace Isaac MD      [address] [place] [address] Office [phone]   Beeper [phone]  08/18/2017 8:24 PM

## 2017-08-22 LAB — ACID FAST CULTURE WITH REFLEXED SENSITIVITIES (MYCOBACTERIA): Acid Fast Culture: NEGATIVE

## 2017-08-27 ENCOUNTER — Other Ambulatory Visit: Payer: Self-pay | Admitting: General Surgery

## 2017-08-28 ENCOUNTER — Other Ambulatory Visit: Payer: Self-pay | Admitting: Radiology

## 2017-08-28 ENCOUNTER — Other Ambulatory Visit: Payer: Self-pay | Admitting: Physician Assistant

## 2017-08-29 ENCOUNTER — Ambulatory Visit (HOSPITAL_COMMUNITY)
Admission: RE | Admit: 2017-08-29 | Discharge: 2017-08-29 | Disposition: A | Discharge status: Home / Self Care | Payer: BLUE CROSS/BLUE SHIELD | Source: Ambulatory Visit | Attending: Cardiothoracic Surgery | Admitting: Cardiothoracic Surgery

## 2017-08-29 ENCOUNTER — Ambulatory Visit (HOSPITAL_COMMUNITY)
Admission: RE | Admit: 2017-08-29 | Discharge: 2017-08-29 | Disposition: A | Discharge status: Home / Self Care | Payer: BLUE CROSS/BLUE SHIELD | Source: Ambulatory Visit | Attending: Diagnostic Radiology | Admitting: Diagnostic Radiology

## 2017-08-29 ENCOUNTER — Encounter (HOSPITAL_COMMUNITY): Payer: Self-pay

## 2017-08-29 DIAGNOSIS — R911 Solitary pulmonary nodule: Secondary | ICD-10-CM | POA: Diagnosis not present

## 2017-08-29 DIAGNOSIS — R918 Other nonspecific abnormal finding of lung field: Secondary | ICD-10-CM | POA: Insufficient documentation

## 2017-08-29 DIAGNOSIS — Z9889 Other specified postprocedural states: Secondary | ICD-10-CM | POA: Diagnosis not present

## 2017-08-29 DIAGNOSIS — Z85528 Personal history of other malignant neoplasm of kidney: Secondary | ICD-10-CM | POA: Insufficient documentation

## 2017-08-29 DIAGNOSIS — J8410 Pulmonary fibrosis, unspecified: Secondary | ICD-10-CM | POA: Diagnosis not present

## 2017-08-29 LAB — CBC
HCT: 37.7 % (ref 36.0–46.0)
Hemoglobin: 12.1 g/dL (ref 12.0–15.0)
MCH: 25.3 pg — ABNORMAL LOW (ref 26.0–34.0)
MCHC: 32.1 g/dL (ref 30.0–36.0)
MCV: 78.7 fL (ref 78.0–100.0)
Platelets: 321 10*3/uL (ref 150–400)
RBC: 4.79 MIL/uL (ref 3.87–5.11)
RDW: 14.2 % (ref 11.5–15.5)
WBC: 6.3 10*3/uL (ref 4.0–10.5)

## 2017-08-29 LAB — PROTIME-INR
INR: 0.92
Prothrombin Time: 12.2 seconds (ref 11.4–15.2)

## 2017-08-29 MED ORDER — FENTANYL CITRATE (PF) 100 MCG/2ML IJ SOLN
INTRAMUSCULAR | Status: AC | PRN
  Administered 2017-08-29 (×2): 25 ug via INTRAVENOUS
  Administered 2017-08-29: 50 ug via INTRAVENOUS

## 2017-08-29 MED ORDER — SODIUM CHLORIDE 0.9 % IV SOLN
INTRAVENOUS | Status: AC | PRN
  Administered 2017-08-29: 75 mL/h via INTRAVENOUS

## 2017-08-29 MED ORDER — MIDAZOLAM HCL 2 MG/2ML IJ SOLN
INTRAMUSCULAR | Status: AC | PRN
  Administered 2017-08-29: 1 mg via INTRAVENOUS
  Administered 2017-08-29: 2 mg via INTRAVENOUS

## 2017-08-29 MED ORDER — LIDOCAINE HCL 1 % IJ SOLN
INTRAMUSCULAR | Status: AC
Start: 2017-08-29 — End: 2017-08-29
  Filled 2017-08-29: qty 20

## 2017-08-29 MED ORDER — HYDROCODONE-ACETAMINOPHEN 5-325 MG PO TABS: 1.0000 | ORAL_TABLET | ORAL | Status: DC | PRN

## 2017-08-29 MED ORDER — FENTANYL CITRATE (PF) 100 MCG/2ML IJ SOLN
INTRAMUSCULAR | Status: AC
  Filled 2017-08-29: qty 4

## 2017-08-29 MED ORDER — MIDAZOLAM HCL 2 MG/2ML IJ SOLN
INTRAMUSCULAR | Status: AC
  Filled 2017-08-29: qty 6

## 2017-08-29 MED ORDER — SODIUM CHLORIDE 0.9 % IV SOLN: INTRAVENOUS | Status: DC

## 2017-08-29 NOTE — Discharge Instructions (Addendum)
Needle Biopsy of the Lung, Care After °This sheet gives you information about how to care for yourself after your procedure. Your health care provider may also give you more specific instructions. If you have problems or questions, contact your health care provider. °What can I expect after the procedure? °After the procedure, it is common to have: °· Soreness, pain, and tenderness where a tissue sample was taken (biopsy site). °· A cough. °· A sore throat. ° °Follow these instructions at home: °Biopsy site care °· Follow instructions from your health care provider about when to remove the bandage that was placed on the biopsy site. °· Keep the bandage dry until it has been removed. °· Check your biopsy site every day for signs of infection. Check for: °? More redness, swelling, or pain. °? More fluid or blood. °? Warmth to the touch. °? Pus or a bad smell. °General instructions °· Rest as directed by your health care provider. Ask your health care provider what activities are safe for you. °· Do not take baths, swim, or use a hot tub until your health care provider approves. °· Take over-the-counter and prescription medicines only as told by your health care provider. °· If you have airplane travel scheduled, talk with your health care provider about when it is safe for you to travel by airplane. °· It is up to you to get the results of your procedure. Ask your health care provider, or the department that is doing the procedure, when your results will be ready. °· Keep all follow-up visits as told by your health care provider. This is important. °Contact a health care provider if: °· You have more redness, swelling, or pain around your biopsy site. °· You have more fluid or blood coming from your biopsy site. °· Your biopsy site feels warm to the touch. °· You have pus or a bad smell coming from your biopsy site. °· You have a fever. °· You have pain that does not get better with medicine. °Get help right away  if: °· You have problems breathing. °· You have chest pain. °· You cough up blood. °· You faint. °· You have a fast heart rate. °Summary °· After a needle biopsy of the lung, it is common to have a cough, a sore throat, or soreness, pain, and tenderness where a tissue sample was taken (biopsy site). °· You should check your biopsy area every day for signs of infection, including pus or a bad smell, warmth, more fluid or blood, or more redness, swelling, or pain. °· You should not take baths, swim, or use a hot tub until your health care provider approves. °· It is up to you to get the results of your procedure. Ask your health care provider, or the department that is doing the procedure, when your results will be ready. °This information is not intended to replace advice given to you by your health care provider. Make sure you discuss any questions you have with your health care provider. °Document Released: 07/29/2007 Document Revised: 08/22/2016 Document Reviewed: 08/22/2016 °Elsevier Interactive Patient Education © 2017 [employer] ° °Moderate Conscious Sedation, Adult, Care After °These instructions provide you with information about caring for yourself after your procedure. Your health care provider may also give you more specific instructions. Your treatment has been planned according to current medical practices, but problems sometimes occur. Call your health care provider if you have any problems or questions after your procedure. °What can I expect after the   procedure? °After your procedure, it is common: °· To feel sleepy for several hours. °· To feel clumsy and have poor balance for several hours. °· To have poor judgment for several hours. °· To vomit if you eat too soon. ° °Follow these instructions at home: °For at least 24 hours after the procedure: ° °· Do not: °? Participate in activities where you could fall or become injured. °? Drive. °? Use heavy machinery. °? Drink alcohol. °? Take sleeping  pills or medicines that cause drowsiness. °? Make important decisions or sign legal documents. °? Take care of children on your own. °· Rest. °Eating and drinking °· Follow the diet recommended by your health care provider. °· If you vomit: °? Drink water, juice, or soup when you can drink without vomiting. °? Make sure you have little or no nausea before eating solid foods. °General instructions °· Have a responsible adult stay with you until you are awake and alert. °· Take over-the-counter and prescription medicines only as told by your health care provider. °· If you smoke, do not smoke without supervision. °· Keep all follow-up visits as told by your health care provider. This is important. °Contact a health care provider if: °· You keep feeling nauseous or you keep vomiting. °· You feel light-headed. °· You develop a rash. °· You have a fever. °Get help right away if: °· You have trouble breathing. °This information is not intended to replace advice given to you by your health care provider. Make sure you discuss any questions you have with your health care provider. °Document Released: 07/22/2013 Document Revised: 03/05/2016 Document Reviewed: 01/21/2016 °Elsevier Interactive Patient Education © 2018 Elsevier Inc. ° °

## 2017-08-29 NOTE — Sedation Documentation (Signed)
Moved to CT 3, scouting images obtained.   

## 2017-08-29 NOTE — Sedation Documentation (Addendum)
Tsp amount of expectorant with small amount of blood, Sat 92% RA, Dr Anselm Pancoast aware of pain. O2 resumed 2 L/[place], HOB elevated

## 2017-08-29 NOTE — Sedation Documentation (Signed)
Patient is resting comfortably. 

## 2017-08-29 NOTE — Procedures (Signed)
CT guided core biopsies of right middle lobe lung nodule.  3 cores obtained.  Minimal blood loss and no immediate complication.  CXR in one hour.

## 2017-08-29 NOTE — H&P (Signed)
Chief Complaint: pulmonary nodules  Referring Physician:Dr. Lanelle Bal  Supervising Physician: Markus Daft  Patient Status: [facility] - Out-pt  HPI: Candace Graham is a 46 y.o. female with a history of renal cell carcinoma s/p resection with clear margins.  She has since last year developed bilateral pulmonary nodules.  These have been tested abundantly.  She has had a needle biopsy a year ago with benign results, PET scans with minimal uptake, and bronchoscopy with benign findings.  However, due to the concern that this is metastatic renal cell carcinoma, a repeat needle biopsy has been request.  The patient has no new complaints.  Past Medical History:  Past Medical History:  Diagnosis Date  . Abnormal uterine bleeding    Heavy menstrual cycles  . Anemia    hx  . Cancer (HCC)    renal cell carcinoma  . Fibroid tumor   . Vaginal delivery 1991, 1999  . Varicose veins of left lower extremity    varicose veins    Past Surgical History:  Past Surgical History:  Procedure Laterality Date  . DILATATION & CURRETTAGE/HYSTEROSCOPY WITH RESECTOCOPE N/A 04/25/2015   Procedure: DILATATION & CURETTAGE/HYSTEROSCOPY WITH RESECTOCOPE;  Surgeon: Megan Salon, MD;  Location: [facility];  Service: Gynecology;  Laterality: N/A;  . LUNG BIOPSY    . ROBOTIC ASSITED PARTIAL NEPHRECTOMY Left 07/27/2016   Procedure: XI ROBOTIC ASSITED LEFT PARTIAL NEPHRECTOMY;  Surgeon: Alexis Frock, MD;  Location: [facility];  Service: Urology;  Laterality: Left;  . uterine polyp removal    . VIDEO BRONCHOSCOPY WITH ENDOBRONCHIAL NAVIGATION N/A 09/12/2016   Procedure: VIDEO BRONCHOSCOPY WITH ENDOBRONCHIAL NAVIGATION;  Surgeon: Collene Gobble, MD;  Location: [facility];  Service: Thoracic;  Laterality: N/A;  . VIDEO BRONCHOSCOPY WITH ENDOBRONCHIAL NAVIGATION N/A 07/10/2017   Procedure: VIDEO BRONCHOSCOPY WITH ENDOBRONCHIAL NAVIGATION;  Surgeon: Collene Gobble, MD;  Location: [facility];  Service: Thoracic;  Laterality: N/A;     Family History:  Family History  Problem Relation Age of Onset  . Pancreatic cancer Mother   . Hypertension Mother   . Stroke Mother   . Thyroid disease Mother   . Hypertension Brother   . Hypertension Brother   . Heart disease Father   . Arthritis Father   . Alcohol abuse Maternal Grandmother   . Esophageal cancer Maternal Grandmother   . Drug abuse Maternal Grandfather     Social History:  reports that  has never smoked. she has never used smokeless tobacco. She reports that she does not drink alcohol or use drugs.  Allergies: No Known Allergies  Medications: Medications reviewed in epic  Please HPI for pertinent positives, otherwise complete 10 system ROS negative.  Mallampati Score: MD Evaluation Airway: WNL Heart: WNL Abdomen: WNL Chest/ Lungs: WNL ASA  Classification: 2 Mallampati/Airway Score: One  Physical Exam: BP (!) 133/103   Pulse 71   Temp 98 F (36.7 C) (Oral)   Resp 18   Ht 5' 7.5" (1.715 m)   Wt 227 lb (103 kg)   LMP 08/15/2014   SpO2 100%   BMI 35.03 kg/m  Body mass index is 35.03 kg/m. General: pleasant, WD, WN black female who is laying in bed in NAD HEENT: head is normocephalic, atraumatic.  Sclera are noninjected.  PERRL.  Ears and nose without any masses or lesions.  Mouth is pink and moist Heart: regular, rate, and rhythm.  Normal s1,s2. No obvious murmurs, gallops, or rubs noted.  Palpable radial and pedal pulses bilaterally  Lungs: CTAB, no wheezes, rhonchi, or rales noted.  Respiratory effort nonlabored Abd: soft, NT, ND, +BS, no masses, hernias, or organomegaly Psych: A&Ox3 with an appropriate affect.   Labs: Results for orders placed or performed during the hospital encounter of 08/29/17 (from the past 48 hour(s))  CBC upon arrival     Status: Abnormal   Collection Time: 08/29/17  7:00 AM  Result Value Ref Range   WBC 6.3 4.0 - 10.5 K/uL   RBC 4.79 3.87 - 5.11 MIL/uL   Hemoglobin 12.1 12.0 - 15.0 g/dL   HCT 37.7 36.0 -  46.0 %   MCV 78.7 78.0 - 100.0 fL   MCH 25.3 (L) 26.0 - 34.0 pg   MCHC 32.1 30.0 - 36.0 g/dL   RDW 14.2 11.5 - 15.5 %   Platelets 321 150 - 400 K/uL  Protime-INR upon arrival     Status: None   Collection Time: 08/29/17  7:00 AM  Result Value Ref Range   Prothrombin Time 12.2 11.4 - 15.2 seconds   INR 0.92     Imaging: No results found.  Assessment/Plan 1. Bilateral pulmonary nodules  We will plan to proceed with a biopsy of a right pulmonary nodule today.  Labs and vitals have been reviewed.  Risks and benefits discussed with the patient including, but not limited to bleeding, hemoptysis, respiratory failure requiring intubation, infection, pneumothorax requiring chest tube placement, stroke from air embolism or even death. All of the patient's questions were answered, patient is agreeable to proceed. Consent signed and in chart.   Thank you for this interesting consult.  I greatly enjoyed meeting CLEO SANTUCCI and look forward to participating in their care.  A copy of this report was sent to the requesting provider on this date.  Electronically Signed: Henreitta Cea 08/29/2017, 8:16 AM   I spent a total of  30 Minutes   in face to face in clinical consultation, greater than 50% of which was counseling/coordinating care for pulmonary nodules

## 2017-08-29 NOTE — Sedation Documentation (Signed)
States pain is with deep breath.  Received fentanyl post case

## 2017-08-29 NOTE — Sedation Documentation (Signed)
No further hemoptysis, Sat 98 on RA. No c/o pain, awaiting CXR. DR Anselm Pancoast in to see pt.  Sleeping unless disturbed.

## 2017-08-29 NOTE — Sedation Documentation (Signed)
O 2 D/C'd

## 2017-08-29 NOTE — Sedation Documentation (Signed)
O2 d/c'd 

## 2017-08-29 NOTE — Sedation Documentation (Signed)
CXR completed

## 2017-09-02 ENCOUNTER — Ambulatory Visit: Payer: BLUE CROSS/BLUE SHIELD | Admitting: Cardiothoracic Surgery

## 2017-09-10 ENCOUNTER — Encounter: Payer: Self-pay | Admitting: Cardiothoracic Surgery

## 2017-09-10 ENCOUNTER — Other Ambulatory Visit: Payer: Self-pay

## 2017-09-10 ENCOUNTER — Ambulatory Visit (INDEPENDENT_AMBULATORY_CARE_PROVIDER_SITE_OTHER): Payer: BLUE CROSS/BLUE SHIELD | Admitting: Cardiothoracic Surgery

## 2017-09-10 VITALS — BP 126/85 | HR 72 | Ht 67.0 in | Wt 220.0 lb

## 2017-09-10 DIAGNOSIS — R918 Other nonspecific abnormal finding of lung field: Secondary | ICD-10-CM | POA: Diagnosis not present

## 2017-09-10 NOTE — Progress Notes (Signed)
[address]       [place] [address]             [phone]                    Candace Graham Medical Record #[id number] Date of Birth: [birth year]  Referring: Collene Gobble, MD Primary Care: Donald Prose, MD  Chief Complaint:    Chief Complaint  Patient presents with  . Follow-up    History of Present Illness:    Candace Graham 46 y.o. female is seen in the office  today for history of multiple bilateral pulmonary nodules.  She has a known history of renal cell carcinoma, with partial left nephrectomy done approximately 1 year ago.  Stage is not reported. She had needle bx and ENB  Twice in the past, last enb last month with  Atypia . 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  Since I last saw her I had recommended a repeat CT-guided needle biopsy of the largest lesion in the right middle lobe.  This was completed without incident, he did not show evidence of renal cell carcinoma.  She returns today to review the pathology findings and discuss about next steps.   FINAL DIAGNOSIS Diagnosis Lung, needle/core biopsy(ies), Right Middle Lobe - FIBROSIS WITH LYMPHOCYTES, PLASMA CELLS AND HISTIOCYTES. - NO CARCINOMA IDENTIFIED. Microscopic Comment There is dense fibrosis with lymphocytes (PAX-8), plasma cells (CD138), and histiocytes (CD68). There is only scant benign epithelium (pancytokeratin). Congo red is negative. Overall, the biopsy is scant and there is no evidence of carcinoma. Vicente Males MD Pathologist, Electronic Signature (Case signed 09/03/2017)   Cancer Staging No matching staging information was found for the patient.   Current Activity/ Functional Status:  Patient is independent with mobility/ambulation, transfers, ADL's, IADL's.   Zubrod Score: At the time of surgery this patient's most appropriate activity status/level should be described as: [x]     0    Normal activity, no symptoms []      1    Restricted in physical strenuous activity but ambulatory, able to do out light work []     2    Ambulatory and capable of self care, unable to do work activities, up and about               >50 % of waking hours                              []     3    Only limited self care, in bed greater than 50% of waking hours []     4    Completely disabled, no self care, confined to bed or chair []     5    Moribund   Past Medical History:  Diagnosis Date  . Abnormal uterine bleeding    Heavy menstrual cycles  . Anemia    hx  . Cancer (HCC)    renal cell carcinoma  . Fibroid tumor   . Vaginal delivery 1991, 1999  . Varicose veins of left lower extremity    varicose veins    Past Surgical History:  Procedure Laterality Date  . DILATATION & CURRETTAGE/HYSTEROSCOPY WITH RESECTOCOPE N/A 04/25/2015   Procedure: DILATATION & CURETTAGE/HYSTEROSCOPY WITH RESECTOCOPE;  Surgeon: Megan Salon, MD;  Location: [facility];  Service: Gynecology;  Laterality: N/A;  . LUNG BIOPSY    .  ROBOTIC ASSITED PARTIAL NEPHRECTOMY Left 07/27/2016   Procedure: XI ROBOTIC ASSITED LEFT PARTIAL NEPHRECTOMY;  Surgeon: Alexis Frock, MD;  Location: [facility];  Service: Urology;  Laterality: Left;  . uterine polyp removal    . VIDEO BRONCHOSCOPY WITH ENDOBRONCHIAL NAVIGATION N/A 09/12/2016   Procedure: VIDEO BRONCHOSCOPY WITH ENDOBRONCHIAL NAVIGATION;  Surgeon: Collene Gobble, MD;  Location: [facility];  Service: Thoracic;  Laterality: N/A;  . VIDEO BRONCHOSCOPY WITH ENDOBRONCHIAL NAVIGATION N/A 07/10/2017   Procedure: VIDEO BRONCHOSCOPY WITH ENDOBRONCHIAL NAVIGATION;  Surgeon: Collene Gobble, MD;  Location: [facility];  Service: Thoracic;  Laterality: N/A;    Family History  Problem Relation Age of Onset  . Pancreatic cancer Mother   . Hypertension Mother   . Stroke Mother   . Thyroid disease Mother   . Hypertension Brother   . Hypertension Brother   . Heart disease Father   . Arthritis Father   . Alcohol abuse Maternal  Grandmother   . Esophageal cancer Maternal Grandmother   . Drug abuse Maternal Grandfather     Social History   Socioeconomic History  . Marital status: Single    Spouse name: Not on file  . Number of children: 2  . Years of education: 23  . Highest education level: Not on file  Social Needs  . Financial resource strain: Not on file  . Food insecurity - worry: Not on file  . Food insecurity - inability: Not on file  . Transportation needs - medical: Not on file  . Transportation needs - non-medical: Not on file  Occupational History  . Not on file  Tobacco Use  . Smoking status: Never Smoker  . Smokeless tobacco: Never Used  Substance and Sexual Activity  . Alcohol use: No  . Drug use: No  . Sexual activity: Yes    Partners: Male    Birth control/protection: Post-menopausal  Other Topics Concern  . Not on file  Social History Narrative   Fun: Travel, working with sons.   Denies religious beliefs effecting health care.    Denies abuse and feels safe at home.     Social History   Tobacco Use  Smoking Status Never Smoker  Smokeless Tobacco Never Used    Social History   Substance and Sexual Activity  Alcohol Use No     No Known Allergies  Current Outpatient Medications  Medication Sig Dispense Refill  . Cholecalciferol (VITAMIN D) 2000 units CAPS Take 2,000 Units by mouth daily.    . Multiple Vitamins-Minerals (MULTIVITAMIN WITH MINERALS) tablet Take 1 tablet daily by mouth.     No current facility-administered medications for this visit.     Pertinent items are noted in HPI.   Review of Systems:     Cardiac Review of Systems: Y or N  Chest Pain [ n   ]  Resting SOB [ n] Exertional SOB  [ n ]  Cardell Peach ]  General Review of Systems: [Y] = yes [  ]=no Constitional: recent weight change [  ];  Wt loss over the last 3 months [   ] anorexia [  ]; fatigue [  ]; nausea [  ]; night sweats [  ]; fever [  ]; or chills [  ];          Dental: poor dentition[   ]; Last Dentist visit:   Eye : blurred vision [  ]; diplopia [ n ]; vision changes [nmaurosis fugax[  n; Resp: cough [n;  wheezing[ n ];  dysphagia[n ]; Dale Baird ];  hematochezia [  ]; heartburn[  ];   Hx of  Colonoscopy[  ]; GU: kidney stones [  ]; hematuria[n];   dysuria [ n ];  nocturia[  ];  history of     obstruction [  ]; urinary frequency [  ]             Skin: rash, swelling[  ];, hair loss[  ];  peripheral edema[  ];  or itching[  ]; Musculosketetal: myalgias[  ];  joint swelling[  ];  joint erythema[  ];  joint pain[  ];  back pain[  ];  Heme/Lymph: bruising[  ];  bleeding[  ];  anemia[  ];  Neuro: TIA[ n ];  headaches[  ];  stroke[  ];  vertigo[  ];  seizures[  ];   paresthesias[ n];  difficulty walking[n ];  Psych:depression[  ]; anxiety[  ];  Endocrine: diabetes[  ];  thyroid dysfunction[  ];  Immunizations: Flu up to date [ n ]; Pneumococcal up to date n  ];  Other:  Physical Exam: BP 126/85   Pulse 72   Ht 5\' 7"  (1.702 m)   Wt 220 lb (99.8 kg)   LMP 08/15/2014   SpO2 99%   BMI 34.46 kg/m   PHYSICAL EXAMINATION: General appearance: alert and cooperative Head: Normocephalic, without obvious abnormality, atraumatic Neck: no adenopathy, no carotid bruit, no JVD, supple, symmetrical, trachea midline and thyroid not enlarged, symmetric, no tenderness/mass/nodules Lymph nodes: Cervical, supraclavicular, and axillary nodes normal. Resp: clear to auscultation bilaterally Back: symmetric, no curvature. ROM normal. No CVA tenderness. Cardio: regular rate and rhythm, S1, S2 normal, no murmur, click, rub or gallop GI: soft, non-tender; bowel sounds normal; no masses,  no organomegaly Extremities: extremities normal, atraumatic, no cyanosis or edema and Homans sign is negative, no sign of DVT Neurologic: Grossly normal  Diagnostic Studies & Laboratory data:     Recent Radiology Findings:  CLINICAL DATA:  Subsequent treatment strategy for enlarging lung nodules and patient with  a history of renal cell carcinoma. Is the.  EXAM: NUCLEAR MEDICINE PET SKULL BASE TO THIGH  TECHNIQUE: 12.8 mCi F-18 FDG was injected intravenously. Full-ring PET imaging was performed from the skull base to thigh after the radiotracer. CT data was obtained and used for attenuation correction and anatomic localization.  FASTING BLOOD GLUCOSE:  Value: 114 mg/dl  COMPARISON:  06/20/2016 he with  FINDINGS: NECK: No hypermetabolic lymph nodes in the neck.  CHEST: Multiple bilateral pulmonary nodules are evident as on multiple previous imaging studies. The dominant lesion is in the posterior right middle lobe (image 37 series 8 today) and measures 2.0 x 1.9 cm, progressed since prior CT of 06/20/2016 when it measured 1.6 x 1.7 cm. Despite the size, only very low level FDG accumulation is evident within the nodule ( SUV max = 2.1). Multiple other bilateral pulmonary nodules of varying size ranging from several mm up to 07-2014 mm. A 10 mm posterior left upper lobe nodule (image 23 series 8) demonstrates SUV max = 2.2.  Heart size is upper normal.  ABDOMEN/PELVIS: No abnormal hypermetabolic activity within the liver, pancreas, adrenal glands, or spleen. No hypermetabolic lymph nodes in the abdomen or pelvis.  SKELETON: No focal hypermetabolic activity to suggest skeletal metastasis.  IMPRESSION: 1. Multiple bilateral pulmonary nodules seen to be progressive on recent diagnostic CT of 05/24/2017. These lesions are not substantially hypermetabolic and discernible FDG uptake is really only seen in the dominant posterior  right middle lobe 2 cm nodule, demonstrating uptake level borderline between infectious/inflammatory and neoplastic etiology. Given back these nodules appeared progressive on the diagnostic CT, poorly FDG avid metastatic disease cannot be excluded. Tissue sampling may be warranted. 2. No other unexpected hypermetabolic disease in the neck,  chest, abdomen, or pelvis.   Electronically Signed   By: Misty Stanley M.D.   On: 06/27/2017 11:21 I have independently reviewed the above radiologic studies.  Recent Lab Findings: Lab Results  Component Value Date   WBC 6.3 08/29/2017   HGB 12.1 08/29/2017   HCT 37.7 08/29/2017   PLT 321 08/29/2017   GLUCOSE 95 07/08/2017   CHOL 162 03/21/2017   TRIG 92 03/21/2017   HDL 43 03/21/2017   LDLCALC 101 (H) 03/21/2017   ALT 24 07/08/2017   AST 24 07/08/2017   NA 138 07/08/2017   K 4.1 07/08/2017   CL 107 07/08/2017   CREATININE 0.70 07/08/2017   BUN 8 07/08/2017   CO2 28 07/08/2017   TSH 2.100 03/21/2017   INR 0.92 08/29/2017   HGBA1C 6.0 (H) 03/17/2015   Results of supra D navigation bronchoscopy pulmonary   diagnosis 1. Lung, biopsy, Right Middle Lobe LUNG PARENCHYMA WITH CHRONIC INFLAMMATION NEGATIVE FOR MALIGNANCY 2. Lung, biopsy, Left Upper Lobe LUNG PARENCHYMA WITH LYMPHOPLASMACYTIC INFILTRATE NEGATIVE FOR MALIGNANCY 3. Lung, biopsy, Right middle LUNG PARENCHYMA WITH LYMPHOPLASMACYTIC INFILTRATE NEGATIVE FOR MALIGNANCY 4. Lung, biopsy, Right middle #2 LUNG PARENCHYMA WITH LYMPHOPLASMACYTIC INFILTRATE NEGATIVE FOR MALIGNANCY Microscopic Comment 3. Immunostains for cytokeratin AE1/3, ck8/18, cytokeratin 7, pax 8, s100, cd1a, cd68 demonstrated negative for metastatic renal cell carcinoma. This case has been consulted with Dr. Tedra Sen[facility] at [facility]. Casimer Lanius MD  Repeat Needle biopsy of the lung: 08/29/2017    Specimen(s) Obtained: Lung, needle/core biopsy(ies), Right Middle Lobe Specimen Clinical Information Multiple lung nodules and history of renal cell carcinoma. Prior nodule biopsies; Indeterminate lung nodules. (nt) Gross Received in formalin is a 0.3 x 0.1 cm core of tan-red soft tissue, and a minute fragment of dark red soft tissue/material. Submitted in one block. (SSW:ah 08/29/17) Stain(s) used in Diagnosis: The following  stain(s) were used in diagnosing the case: PAX 8, CK AE1AE3, Congo Red Stain, CD 138, CD 68. The control(s) stained appropriately.    Assessment / Plan:   Patient with history of renal cell carcinoma resected one year ago, with bilateral pulmonary nodules suggestive of malignancy.   I reviewed with the patient the most recent pathologic findings still do not make a conclusive diagnosis as to the etiology of the multiple pulmonary nodules that she has had present for several years that have only slightly increased in size and only the largest one is very mildly hypermetabolic.  I have recommended to her that we strongly consider proceeding with right video-assisted thoracoscopy and wedge resection of at least one entire lesion, for diagnostic purposes both culture and pathology.  I told her at the very minimum we should repeat CT scan at intervals, she wishes to discuss with her family if she wants to proceed with video-assisted thoracoscopy and lung biopsy in the near future and will call the office back in the next several days.  Her questions were answered   Grace Isaac MD      [address] [place] [address] Office [phone]   Beeper [phone]  09/10/2017 5:19 PM

## 2017-12-10 DIAGNOSIS — C642 Malignant neoplasm of left kidney, except renal pelvis: Secondary | ICD-10-CM | POA: Diagnosis not present

## 2018-02-18 ENCOUNTER — Telehealth: Payer: Self-pay | Admitting: Certified Nurse Midwife

## 2018-02-18 NOTE — Telephone Encounter (Signed)
Patient is having some spotting. She is post-menopausal and hasn't had a cycle in 3 years.

## 2018-02-18 NOTE — Telephone Encounter (Signed)
Spoke with patient. Reports PMB, spotting on 02/15/18. Denies pain, fever/chills, N/V, urinary complaints, odor or bleeding currently. Last menses 3 years ago.   Recommended OV with physician for further evaluation. OV scheduled with Dr. Sabra Heck on 5/10 at 10:45am. Advised patient to return call if any new symptoms develop or bleeding becomes heavy. Advised provider will review, our office will return call with any additional recommendations.  Routing to provider for final review. Patient is agreeable to disposition. Will close encounter.  Cc: Melvia Heaps, CNM

## 2018-02-21 ENCOUNTER — Telehealth: Payer: Self-pay | Admitting: Obstetrics & Gynecology

## 2018-02-21 ENCOUNTER — Encounter: Payer: Self-pay | Admitting: *Deleted

## 2018-02-21 ENCOUNTER — Ambulatory Visit: Payer: BLUE CROSS/BLUE SHIELD | Admitting: Obstetrics & Gynecology

## 2018-02-21 NOTE — Telephone Encounter (Signed)
Yes, this is fine. Thanks!

## 2018-02-21 NOTE — Telephone Encounter (Signed)
Patient called to reschedule her problem visit appointment this morning after the scheduled appointment time because she overslept. She is aware of the fee. She is no longer bleeding and rescheduled to the next available on 03/04/18 at 4pm. Is this ok?

## 2018-02-21 NOTE — Telephone Encounter (Signed)
Spoke with patient. Appointment for evaluation of PMB moved to 02/25/2018 at 11:15 am with Dr.Miller. Patient is agreeable to date and time. Declines appointment for this afternoon. No longer having any bleeding.  Routing to provider for final review. Patient agreeable to disposition. Will close encounter.

## 2018-02-25 ENCOUNTER — Ambulatory Visit: Payer: BLUE CROSS/BLUE SHIELD | Admitting: Obstetrics & Gynecology

## 2018-02-25 ENCOUNTER — Encounter: Payer: Self-pay | Admitting: Obstetrics & Gynecology

## 2018-02-25 ENCOUNTER — Other Ambulatory Visit: Payer: Self-pay

## 2018-02-25 VITALS — BP 128/78 | HR 88 | Resp 16 | Ht 67.5 in | Wt 228.4 lb

## 2018-02-25 DIAGNOSIS — N858 Other specified noninflammatory disorders of uterus: Secondary | ICD-10-CM | POA: Diagnosis not present

## 2018-02-25 DIAGNOSIS — N950 Postmenopausal bleeding: Secondary | ICD-10-CM | POA: Diagnosis not present

## 2018-02-25 DIAGNOSIS — Z113 Encounter for screening for infections with a predominantly sexual mode of transmission: Secondary | ICD-10-CM

## 2018-02-25 NOTE — Progress Notes (Signed)
GYNECOLOGY  VISIT  CC:   Vaginal bleeding  HPI: 47 y.o. [id number] Single African American female here for episode of PMP bleeding.  Has not had any cycles since 11/15.  Bleeding was light pink on 02/15/18.  She's continued to have cramping the last 10 days.  She's had some spotting again yesterday.  Having hot flashes over the past several years.  Clarnce Flock Dr. Tresa Moore, Urology, for follow up from renal cell carcinoma.  Did not need imaging this year.  H/o partial left nephrectomy.  Will see him every year for five full years post op.    Last Pap smear 6/18 that was negative with negative HR HPV.  SA with same partner for six years.    Denies urinary symptoms.  No recent bowel changes.  No fevers.    GYNECOLOGIC HISTORY: Patient's last menstrual period was 08/15/2014.[name] Contraception: PMP Menopausal hormone therapy: none  Patient Active Problem List   Diagnosis Date Noted  . Multiple lung nodules 06/27/2016  . Renal cell carcinoma ([place]) 06/27/2016  . Back pain 04/28/2015  . Obesity 04/28/2015  . Prediabetes 04/28/2015  . Abnormal perimenopausal bleeding 04/19/2015  . Anemia, iron deficiency 02/16/2013  . Fibroids 02/16/2013    Past Medical History:  Diagnosis Date  . Abnormal uterine bleeding    Heavy menstrual cycles  . Anemia    hx  . Cancer (HCC)    renal cell carcinoma  . Fibroid tumor   . Vaginal delivery 1991, 1999  . Varicose veins of left lower extremity    varicose veins    Past Surgical History:  Procedure Laterality Date  . DILATATION & CURRETTAGE/HYSTEROSCOPY WITH RESECTOCOPE N/A 04/25/2015   Procedure: DILATATION & CURETTAGE/HYSTEROSCOPY WITH RESECTOCOPE;  Surgeon: Megan Salon, MD;  Location: [facility];  Service: Gynecology;  Laterality: N/A;  . LUNG BIOPSY    . ROBOTIC ASSITED PARTIAL NEPHRECTOMY Left 07/27/2016   Procedure: XI ROBOTIC ASSITED LEFT PARTIAL NEPHRECTOMY;  Surgeon: Alexis Frock, MD;  Location: [facility];  Service: Urology;  Laterality: Left;  .  uterine polyp removal    . VIDEO BRONCHOSCOPY WITH ENDOBRONCHIAL NAVIGATION N/A 09/12/2016   Procedure: VIDEO BRONCHOSCOPY WITH ENDOBRONCHIAL NAVIGATION;  Surgeon: Collene Gobble, MD;  Location: [facility];  Service: Thoracic;  Laterality: N/A;  . VIDEO BRONCHOSCOPY WITH ENDOBRONCHIAL NAVIGATION N/A 07/10/2017   Procedure: VIDEO BRONCHOSCOPY WITH ENDOBRONCHIAL NAVIGATION;  Surgeon: Collene Gobble, MD;  Location: [facility];  Service: Thoracic;  Laterality: N/A;    MEDS:   Current Outpatient Medications on File Prior to Visit  Medication Sig Dispense Refill  . Cholecalciferol (VITAMIN D) 2000 units CAPS Take 2,000 Units by mouth daily.    . Multiple Vitamins-Minerals (MULTIVITAMIN WITH MINERALS) tablet Take 1 tablet daily by mouth.    . NON FORMULARY forskolin     No current facility-administered medications on file prior to visit.     ALLERGIES: Patient has no known allergies.  Family History  Problem Relation Age of Onset  . Pancreatic cancer Mother   . Hypertension Mother   . Stroke Mother   . Thyroid disease Mother   . Hypertension Brother   . Hypertension Brother   . Heart disease Father   . Arthritis Father   . Alcohol abuse Maternal Grandmother   . Esophageal cancer Maternal Grandmother   . Drug abuse Maternal Grandfather     SH:  In long term relationship, non smoker  Review of Systems  Constitutional: Negative.   Respiratory: Negative.  Cardiovascular: Negative.   Gastrointestinal: Negative.   Genitourinary:       Vaginal spotting/bleeding  Psychiatric/Behavioral: Negative.     PHYSICAL EXAMINATION:    BP 128/78 (BP Location: Right Arm, Patient Position: Sitting, Cuff Size: Normal)   Pulse 88   Resp 16   Ht 5' 7.5" (1.715 m)   Wt 228 lb 6.4 oz (103.6 kg)   LMP 08/15/2014   BMI 35.24 kg/m     General appearance: alert, cooperative and appears stated age Neck: no adenopathy, supple, symmetrical, trachea midline and thyroid normal to inspection and palpation CV:   Regular rate and rhythm Lungs:  clear to auscultation, no wheezes, rales or rhonchi, symmetric air entry Abdomen: soft, non-tender; bowel sounds normal; no masses,  no organomegaly  Pelvic: External genitalia:  no lesions              Urethra:  normal appearing urethra with no masses, tenderness or lesions              Bartholins and Skenes: normal                 Vagina: normal appearing vagina with normal color; brownish blood/mucous mixture noted, no lesions              Cervix: no lesions              Bimanual Exam:  Uterus:  enlarged, 10-12 weeks size, globular weeks size              Adnexa: no mass, fullness, tenderness  Endometrial biopsy recommended.  Discussed with patient.  Verbal and written consent obtained.   Procedure:  Speculum placed.  Cervix visualized and cleansed with betadine prep.  A single toothed tenaculum was applied to the anterior lip of the cervix.  Endometrial pipelle was advanced through the cervix into the endometrial cavity without difficulty.  Pipelle passed to 8cm.  Suction applied and pipelle removed with good tissue sample obtained.  Tenculum removed.  No bleeding noted.  Patient tolerated procedure well.  Chaperone was present for exam.  Assessment: PMP bleeding H/O uterine fibroids H/O lung nodules of unknown etiology H/O renal cell carcinoma s/p partial left nephrectomy  Plan: GC/Chl pending Endometrial biopsy obtained.  If this is negative for abnormal cells, will pt have return for PUS.  She is comfortable with plan.

## 2018-02-26 LAB — GC/CHLAMYDIA PROBE AMP
Chlamydia trachomatis, NAA: NEGATIVE
Neisseria gonorrhoeae by PCR: NEGATIVE

## 2018-03-04 ENCOUNTER — Ambulatory Visit: Payer: BLUE CROSS/BLUE SHIELD | Admitting: Obstetrics & Gynecology

## 2018-03-04 ENCOUNTER — Other Ambulatory Visit: Payer: Self-pay | Admitting: *Deleted

## 2018-03-04 DIAGNOSIS — N950 Postmenopausal bleeding: Secondary | ICD-10-CM

## 2018-03-06 DIAGNOSIS — E669 Obesity, unspecified: Secondary | ICD-10-CM | POA: Diagnosis not present

## 2018-03-06 DIAGNOSIS — E559 Vitamin D deficiency, unspecified: Secondary | ICD-10-CM | POA: Diagnosis not present

## 2018-03-06 DIAGNOSIS — M7989 Other specified soft tissue disorders: Secondary | ICD-10-CM | POA: Diagnosis not present

## 2018-03-06 DIAGNOSIS — D509 Iron deficiency anemia, unspecified: Secondary | ICD-10-CM | POA: Diagnosis not present

## 2018-03-13 ENCOUNTER — Ambulatory Visit (INDEPENDENT_AMBULATORY_CARE_PROVIDER_SITE_OTHER): Payer: BLUE CROSS/BLUE SHIELD

## 2018-03-13 ENCOUNTER — Encounter: Payer: Self-pay | Admitting: Obstetrics & Gynecology

## 2018-03-13 ENCOUNTER — Ambulatory Visit (INDEPENDENT_AMBULATORY_CARE_PROVIDER_SITE_OTHER): Payer: BLUE CROSS/BLUE SHIELD | Admitting: Obstetrics & Gynecology

## 2018-03-13 VITALS — BP 136/96 | HR 72 | Resp 16 | Ht 67.5 in | Wt 229.0 lb

## 2018-03-13 DIAGNOSIS — N950 Postmenopausal bleeding: Secondary | ICD-10-CM

## 2018-03-13 DIAGNOSIS — R9389 Abnormal findings on diagnostic imaging of other specified body structures: Secondary | ICD-10-CM

## 2018-03-13 NOTE — Progress Notes (Addendum)
47 y.o. [id number] Single African American female here for pelvic ultrasound due to episode of PMP bleeding in pt with complicated past medical history of the past two (+) years.  She has not had any further bleeding.  An endometrial biopsy was negative for abnormal cells on 02/25/18.  Patient's last menstrual period was 08/15/2014.  Contraception: PMP  Findings:  UTERUS: 8.3 x 6.0 x 5.5 cm with at least 6 fibroids noted.  The largest measures 3.6 x 1.8 cm. EMS: 5.1 to 6.8 mm, likely presence of 2 polyps with positive vascular flow noted ADNEXA: Left ovary: 1.0 x 0.8 x 0.6 cm       Right ovary: 1.6 x 1.0 x 0.8 cm CUL DE SAC: No free fluid  Discussion: Findings reviewed with patient.  Comparison to prior ultrasound made.  Overall size of uterus and fibroids are smaller.  However there does appear to be at least one if not 2 polyps present.  Due to the bleeding and the number of polyps, hysteroscopy with polyp resection is recommended.  She's had this procedure previously and states she does understand the procedure details.  Procedure still reviewed.  Recovery and pain management discussed.  Risks discussed including but not limited to bleeding, rare risk of transfusion, infection, 1% risk of uterine perforation with risks of fluid deficit causing cardiac arrythmia, cerebral swelling and/or need to stop procedure early.  Fluid emboli discussed.  DVT/PE, rare risk of risk of bowel/bladder/ureteral/vascular injury.  Patient aware if pathology abnormal she may need additional treatment.  All questions answered.    Physical Exam  Constitutional: She is oriented to person, place, and time. She appears well-developed and well-nourished.  Cardiovascular: Regular rhythm.  Respiratory: Effort normal and breath sounds normal.  Neurological: She is alert and oriented to person, place, and time.  Skin: Skin is warm and dry.  Psychiatric: She has a normal mood and affect.   Assessment:  PMP  bleeding Thickened endometrium and findings c/w endometrial polyps Uterine fibroids that are smaller in size today  Plan:  Will proceed with hysteroscopy with polyp resection and D&C  ~25 minutes spent with patient >50% of time was in face to face discussion of above.

## 2018-03-16 ENCOUNTER — Encounter: Payer: Self-pay | Admitting: Obstetrics & Gynecology

## 2018-03-17 ENCOUNTER — Telehealth: Payer: Self-pay | Admitting: Obstetrics & Gynecology

## 2018-03-17 NOTE — Telephone Encounter (Signed)
Spoke with patient regarding benefit for surgery. Patient understood and agreeable. Patient aware this is professional benefit only. Patient aware will be contacted by hospital for separate benefits. Patient advises she will call back to confirm and proceed with scheduling.    Cc: Lamont Snowball, RN

## 2018-03-20 NOTE — Telephone Encounter (Signed)
Call placed to patient to follow up regarding benefits and scheduling of recommended surgery. Left voicemail message requesting a return call    cc: Lamont Snowball, RN

## 2018-03-25 ENCOUNTER — Ambulatory Visit: Payer: BLUE CROSS/BLUE SHIELD | Admitting: Certified Nurse Midwife

## 2018-03-25 NOTE — Telephone Encounter (Signed)
Call placed to patient to follow up in regards to scheduling recommended surgery. Left Voicemail message requesting a return call   cc: Lamont Snowball, RN

## 2018-04-03 NOTE — Telephone Encounter (Signed)
Follow-up call to patient. Per ROI, can leave message on cell number.  Left message calling to assist with scheduling recommended procedure. Left message to call back to [facility], [employer], [employer].

## 2018-04-15 DIAGNOSIS — C642 Malignant neoplasm of left kidney, except renal pelvis: Secondary | ICD-10-CM | POA: Diagnosis not present

## 2018-05-20 ENCOUNTER — Telehealth: Payer: Self-pay | Admitting: Obstetrics & Gynecology

## 2018-05-20 NOTE — Telephone Encounter (Signed)
Follow-up call to patient regarding plan for scheduling procedure. Stressed importance of proceeding with evaluation of PMB. Patient has financial concerns. See account notes- will work with this with business office as advised important to proceed.  Will have business office rep contact patient.

## 2018-05-20 NOTE — Telephone Encounter (Signed)
Call to patient to discuss surgery date options. Patient agreeable to 06-02-18.  Surgery instruction sheet reviewed and printed copy will be provided at consult appointment scheduled with Dr Sabra Heck on 05-23-18.  Routing to provider for final review. Will close encounter.

## 2018-05-20 NOTE — Telephone Encounter (Signed)
See previous notes. Reviewed benefits again for recommended surgery, see account notes for details. Patient is agreeable and advises she will be in office on Friday 05/23/18 to confirm and move forward with scheduling   cc: Lamont Snowball, RN

## 2018-05-21 ENCOUNTER — Telehealth: Payer: Self-pay | Admitting: *Deleted

## 2018-05-21 NOTE — Telephone Encounter (Signed)
Call to patient. Advised of surgery start time change to 1230. Arrive at 1030. Voiced understanding.   Encounter closed.

## 2018-05-23 ENCOUNTER — Encounter: Payer: Self-pay | Admitting: Obstetrics & Gynecology

## 2018-05-23 ENCOUNTER — Other Ambulatory Visit: Payer: Self-pay

## 2018-05-23 ENCOUNTER — Ambulatory Visit (INDEPENDENT_AMBULATORY_CARE_PROVIDER_SITE_OTHER): Payer: BLUE CROSS/BLUE SHIELD | Admitting: Obstetrics & Gynecology

## 2018-05-23 ENCOUNTER — Other Ambulatory Visit: Payer: Self-pay | Admitting: Obstetrics & Gynecology

## 2018-05-23 VITALS — BP 124/80 | HR 84 | Resp 18 | Ht 67.5 in | Wt 233.6 lb

## 2018-05-23 DIAGNOSIS — Z87898 Personal history of other specified conditions: Secondary | ICD-10-CM

## 2018-05-23 DIAGNOSIS — R918 Other nonspecific abnormal finding of lung field: Secondary | ICD-10-CM | POA: Diagnosis not present

## 2018-05-23 DIAGNOSIS — N950 Postmenopausal bleeding: Secondary | ICD-10-CM | POA: Diagnosis not present

## 2018-05-23 DIAGNOSIS — R9389 Abnormal findings on diagnostic imaging of other specified body structures: Secondary | ICD-10-CM | POA: Diagnosis not present

## 2018-05-23 NOTE — Progress Notes (Signed)
47 y.o. [id number] Married [place] American female here for discussion of recommended procedure after patient experienced PMP bleeding in May.  She had an endometrial biopsy that was negative for abnormal cells obtained 02/25/18.  She underwent a SHGM on 03/13/18 showing multiple fibroids (which were known) and a 6.20mm endometrium with two likely polyps.  Due to this finding, hysteroscopy with polyp resection, D&C was recommended.  Pt was not ready to proceed in early June.  She was contacted again and at that time decided to proceed.  Husband is with her today.  Procedure discussed with patient.  Recovery and pain management discussed.  Risks discussed including but not limited to bleeding, rare risk of transfusion, infection, 1% risk of uterine perforation with risks of fluid deficit causing cardiac arrythmia, cerebral swelling and/or need to stop procedure early.  Fluid emboli and rare risk of death discussed.  DVT/PE, rare risk of risk of bowel/bladder/ureteral/vascular injury.  Patient aware if pathology abnormal she may need additional treatment.  All questions answered.  Other than doing nothing, I do not feel any alternatives exist at this time.  Pt does have hx of multiple lung nodules with some progressive findings and multiple cytology and biopsy results that are negative.  She has seen both pulmonology and cardiothoracic surgeon.  Despite CT guided biopsy, final diagnosis has not been made.  Most recent recommendation by Dr. Servando Snare was for pt to have a wedge resection for tissue diagnosis purposes.  Pt has declined to proceed with this.  With husband, I did discuss importance of proceeding with obtaining diagnosis and offered to help with referral for a second opinion.  She is open to this, as is her husband.  Will proceed with this.  Ob Hx:   Patient's last menstrual period was 08/15/2014.          Sexually active: Yes.   Birth control: post menopausal  Last pap: 03/21/17 Neg. HR HPV:neg  Last MMG:  02/22/17 BIRADS1:Neg  Tobacco: No  Past Surgical History:  Procedure Laterality Date  . DILATATION & CURRETTAGE/HYSTEROSCOPY WITH RESECTOCOPE N/A 04/25/2015   Procedure: DILATATION & CURETTAGE/HYSTEROSCOPY WITH RESECTOCOPE;  Surgeon: Megan Salon, MD;  Location: [facility];  Service: Gynecology;  Laterality: N/A;  . LUNG BIOPSY    . ROBOTIC ASSITED PARTIAL NEPHRECTOMY Left 07/27/2016   Procedure: XI ROBOTIC ASSITED LEFT PARTIAL NEPHRECTOMY;  Surgeon: Alexis Frock, MD;  Location: [facility];  Service: Urology;  Laterality: Left;  [name] VIDEO BRONCHOSCOPY WITH ENDOBRONCHIAL NAVIGATION N/A 09/12/2016   Procedure: VIDEO BRONCHOSCOPY WITH ENDOBRONCHIAL NAVIGATION;  Surgeon: Collene Gobble, MD;  Location: [facility];  Service: Thoracic;  Laterality: N/A;  . VIDEO BRONCHOSCOPY WITH ENDOBRONCHIAL NAVIGATION N/A 07/10/2017   Procedure: VIDEO BRONCHOSCOPY WITH ENDOBRONCHIAL NAVIGATION;  Surgeon: Collene Gobble, MD;  Location: [facility];  Service: Thoracic;  Laterality: N/A;    Past Medical History:  Diagnosis Date  . Fibroid uterus   . History of anemia   . Menorrhagia    before menopause  . Renal cell carcinoma ([place])   . Varicose veins of left lower extremity    varicose veins    Allergies: Patient has no known allergies.  Current Outpatient Medications  Medication Sig Dispense Refill  . Cholecalciferol (VITAMIN D) 2000 units CAPS Take 2,000 Units by mouth daily.    . Multiple Vitamins-Minerals (MULTIVITAMIN WITH MINERALS) tablet Take 1 tablet daily by mouth.    . NON FORMULARY forskolin    . Vitamin D, Ergocalciferol, (DRISDOL) 50000 units CAPS capsule  0   No current facility-administered medications for this visit.     ROS: A comprehensive review of systems was negative.  Exam:    BP 124/80 (BP Location: Right Arm, Patient Position: Sitting, Cuff Size: Large)   Pulse 84   Resp 18   Ht 5' 7.5" (1.715 m)   Wt 233 lb 9.6 oz (106 kg)   LMP 08/15/2014   BMI 36.05 kg/m   General appearance: alert  and cooperative Head: Normocephalic, without obvious abnormality, atraumatic Neck: no adenopathy, supple, symmetrical, trachea midline and thyroid not enlarged, symmetric, no tenderness/mass/nodules Lungs: clear to auscultation bilaterally Heart: regular rate and rhythm, S1, S2 normal, no murmur, click, rub or gallop Extremities: extremities normal, atraumatic, no cyanosis or edema Skin: Skin color, texture, turgor normal. No rashes or lesions Lymph nodes: Cervical, supraclavicular, and axillary nodes normal. no inguinal nodes palpated Neurologic: Grossly normal  Pelvic: not performed today.  A: PMP bleeding  Endometrial polyps Uterine fibroids Lung nodules    P:  Hysteroscopy with polyp resection, dilation and curettage planned Medications/Vitamins reviewed.  Will proceed with referral for second opinion Post op instructions reviewed as well.    ~In addition to discussion of procedure, risks, benefits and alternatives, ~20 minutes spent with patient all in face to face discussion of lung issues and need to proceed with additional evaluation/second opinion.

## 2018-05-26 NOTE — Patient Instructions (Signed)
Candace Graham  05/26/2018    Candace Graham  05/26/2018      Your procedure is scheduled on 06/02/2018   Report to [facility]  at      1045 A.M.  Call this number if you have problems the morning of [phone]  OUR ADDRESS IS [place], WE ARE LOCATED IN THE [facility]ICAL [facility] WITH [facility].   Remember:  Do not eat food or drink liquids after midnight.  Take these medicines the morning of surgery with A SIP OF WATER; none    Do not wear jewelry, make-up or nail polish.  Do not wear lotions, powders, or perfumes, or deoderant.  Do not shave 48 hours prior to surgery.  Men may shave face and neck.  Do not bring valuables to the hospital.  [facility] is not responsible for any belongings or valuables.  Contacts, dentures or bridgework may not be worn into surgery.  Leave your suitcase in the car.  After surgery it may be brought to your room.  For patients admitted to the hospital, discharge time will be determined by your treatment team.  Patients discharged the day of surgery will not be allowed to drive home.     Please read over the following fact sheets that you were given:                     ___________________________________             [facility] - Preparing for Surgery Before surgery, you can play an important role.  Because skin is not sterile, your skin needs to be as free of germs as possible.  You can reduce the number of germs on your skin by washing with CHG (chlorahexidine gluconate) soap before surgery.  CHG is an antiseptic cleaner which kills germs and bonds with the skin to continue killing germs even after washing. Please DO NOT use if you have an allergy to CHG or antibacterial soaps.  If your skin becomes reddened/irritated stop using the CHG and inform your nurse when you arrive at Short Stay. Do not shave (including legs and underarms) for at least 48 hours prior to the first CHG  shower.  You may shave your face/neck. Please follow these instructions carefully:  1.  Shower with CHG Soap the night before surgery and the  morning of Surgery.  2.  If you choose to wash your hair, wash your hair first as usual with your  normal  shampoo.  3.  After you shampoo, rinse your hair and body thoroughly to remove the  shampoo.                           4.  Use CHG as you would any other liquid soap.  You can apply chg directly  to the skin and wash                       Gently with a scrungie or clean washcloth.  5.  Apply the CHG Soap to your body ONLY FROM THE NECK DOWN.   Do not use on face/ open                           Wound or open sores. Avoid contact with  eyes, ears mouth and genitals (private parts).                       Wash face,  Genitals (private parts) with your normal soap.             6.  Wash thoroughly, paying special attention to the area where your surgery  will be performed.  7.  Thoroughly rinse your body with warm water from the neck down.  8.  DO NOT shower/wash with your normal soap after using and rinsing off  the CHG Soap.                9.  Pat yourself dry with a clean towel.            10.  Wear clean pajamas.            11.  Place clean sheets on your bed the night of your first shower and do not  sleep with pets. Day of Surgery : Do not apply any lotions/deodorants the morning of surgery.  Please wear clean clothes to the hospital/surgery center.  FAILURE TO FOLLOW THESE INSTRUCTIONS MAY RESULT IN THE CANCELLATION OF YOUR SURGERY PATIENT SIGNATURE_________________________________  NURSE SIGNATURE__________________________________  ________________________________________________________________________

## 2018-05-26 NOTE — Progress Notes (Signed)
06/26/2017-echo-epic  cxr-1v-08/29/17-epic

## 2018-05-27 ENCOUNTER — Inpatient Hospital Stay (HOSPITAL_COMMUNITY)
Admission: RE | Admit: 2018-05-27 | Discharge: 2018-05-27 | Disposition: A | Discharge status: Home / Self Care | Payer: BLUE CROSS/BLUE SHIELD | Source: Ambulatory Visit

## 2018-05-27 DIAGNOSIS — M5136 Other intervertebral disc degeneration, lumbar region: Secondary | ICD-10-CM | POA: Diagnosis not present

## 2018-05-28 ENCOUNTER — Encounter (HOSPITAL_COMMUNITY): Payer: Self-pay

## 2018-05-28 ENCOUNTER — Encounter (HOSPITAL_COMMUNITY)
Admission: RE | Admit: 2018-05-28 | Discharge: 2018-05-28 | Disposition: A | Discharge status: Home / Self Care | Payer: BLUE CROSS/BLUE SHIELD | Source: Ambulatory Visit | Attending: Obstetrics & Gynecology | Admitting: Obstetrics & Gynecology

## 2018-05-28 ENCOUNTER — Other Ambulatory Visit: Payer: Self-pay

## 2018-05-28 DIAGNOSIS — N950 Postmenopausal bleeding: Secondary | ICD-10-CM | POA: Diagnosis not present

## 2018-05-28 DIAGNOSIS — Z87898 Personal history of other specified conditions: Secondary | ICD-10-CM

## 2018-05-28 DIAGNOSIS — Z01812 Encounter for preprocedural laboratory examination: Secondary | ICD-10-CM | POA: Diagnosis not present

## 2018-05-28 DIAGNOSIS — N840 Polyp of corpus uteri: Secondary | ICD-10-CM | POA: Insufficient documentation

## 2018-05-28 DIAGNOSIS — D250 Submucous leiomyoma of uterus: Secondary | ICD-10-CM | POA: Diagnosis not present

## 2018-05-28 LAB — BASIC METABOLIC PANEL
Anion gap: 7 (ref 5–15)
BUN: 18 mg/dL (ref 6–20)
CO2: 27 mmol/L (ref 22–32)
Calcium: 9.2 mg/dL (ref 8.9–10.3)
Chloride: 110 mmol/L (ref 98–111)
Creatinine, Ser: 0.84 mg/dL (ref 0.44–1.00)
GFR calc Af Amer: >60 mL/min (ref >60)
GFR calc non Af Amer: >60 mL/min (ref >60)
Glucose, Bld: 96 mg/dL (ref 70–99)
Potassium: 4 mmol/L (ref 3.5–5.1)
Sodium: 144 mmol/L (ref 135–145)

## 2018-05-28 LAB — CBC
HCT: 38.2 % (ref 36.0–46.0)
Hemoglobin: 12.1 g/dL (ref 12.0–15.0)
MCH: 25.2 pg — ABNORMAL LOW (ref 26.0–34.0)
MCHC: 31.7 g/dL (ref 30.0–36.0)
MCV: 79.6 fL (ref 78.0–100.0)
Platelets: 310 10*3/uL (ref 150–400)
RBC: 4.8 MIL/uL (ref 3.87–5.11)
RDW: 14.2 % (ref 11.5–15.5)
WBC: 4.8 10*3/uL (ref 4.0–10.5)

## 2018-05-28 LAB — HEMOGLOBIN A1C
Hgb A1c MFr Bld: 5.6 % (ref 4.8–5.6)
Mean Plasma Glucose: 114.02 mg/dL

## 2018-05-28 NOTE — Progress Notes (Signed)
Dr. Lissa Hoard at preop to see pt. Per Dr. Sabra Heck request.

## 2018-05-28 NOTE — Patient Instructions (Addendum)
GWENDOLYNN MERKEY  05/28/2018      Your procedure is scheduled on    06-02-18  Report to [facility]  at        [place].  Call this number if you have problems the morning of surgery:[phone]  OUR ADDRESS IS [place], WE ARE LOCATED IN THE [facility] WITH [facility].   Remember:  Do not eat food or drink liquids after midnight.  Take these medicines the morning of surgery with A SIP OF WATER  NONE   Do not wear jewelry, make-up or nail polish.  Do not wear lotions, powders, or perfumes, or deoderant.  Do not shave 48 hours prior to surgery.    Do not bring valuables to the hospital.  [facility] is not responsible for any belongings or valuables.  Contacts, dentures or bridgework may not be worn into surgery.  Leave your suitcase in the car.  After surgery it may be brought to your room.  For patients admitted to the hospital, discharge time will be determined by your treatment team.  Patients discharged the day of surgery will not be allowed to drive home.   Special instructions:    Please read over the following fact sheets that you were given:    [facility] - Preparing for Surgery Before surgery, you can play an important role.  Because skin is not sterile, your skin needs to be as free of germs as possible.  You can reduce the number of germs on your skin by washing with CHG (chlorahexidine gluconate) soap before surgery.  CHG is an antiseptic cleaner which kills germs and bonds with the skin to continue killing germs even after washing. Please DO NOT use if you have an allergy to CHG or antibacterial soaps.  If your skin becomes reddened/irritated stop using the CHG and inform your nurse when you arrive at Short Stay. Do not shave (including legs and underarms) for at least 48 hours prior to the first CHG shower.  You may shave your face/neck. Please follow these instructions carefully:  1.  Shower with CHG Soap the night before  surgery and the  morning of Surgery.  2.  If you choose to wash your hair, wash your hair first as usual with your  normal  shampoo.  3.  After you shampoo, rinse your hair and body thoroughly to remove the  shampoo.                           4.  Use CHG as you would any other liquid soap.  You can apply chg directly  to the skin and wash                       Gently with a scrungie or clean washcloth.  5.  Apply the CHG Soap to your body ONLY FROM THE NECK DOWN.   Do not use on face/ open                           Wound or open sores. Avoid contact with eyes, ears mouth and genitals (private parts).                       Wash face,  Genitals (private parts) with your normal soap.  6.  Wash thoroughly, paying special attention to the area where your surgery  will be performed.  7.  Thoroughly rinse your body with warm water from the neck down.  8.  DO NOT shower/wash with your normal soap after using and rinsing off  the CHG Soap.                9.  Pat yourself dry with a clean towel.            10.  Wear clean pajamas.            11.  Place clean sheets on your bed the night of your first shower and do not  sleep with pets. Day of Surgery : Do not apply any lotions/deodorants the morning of surgery.  Please wear clean clothes to the hospital/surgery center.  FAILURE TO FOLLOW THESE INSTRUCTIONS MAY RESULT IN THE CANCELLATION OF YOUR SURGERY PATIENT SIGNATURE_________________________________  NURSE SIGNATURE__________________________________  ________________________________________________________________________

## 2018-06-02 ENCOUNTER — Ambulatory Visit (HOSPITAL_BASED_OUTPATIENT_CLINIC_OR_DEPARTMENT_OTHER): Payer: BLUE CROSS/BLUE SHIELD | Admitting: Anesthesiology

## 2018-06-02 ENCOUNTER — Encounter (HOSPITAL_BASED_OUTPATIENT_CLINIC_OR_DEPARTMENT_OTHER)
Admission: RE | Disposition: A | Discharge status: Home / Self Care | Payer: Self-pay | Source: Other Acute Inpatient Hospital | Attending: Obstetrics & Gynecology

## 2018-06-02 ENCOUNTER — Encounter (HOSPITAL_BASED_OUTPATIENT_CLINIC_OR_DEPARTMENT_OTHER): Payer: Self-pay | Admitting: *Deleted

## 2018-06-02 ENCOUNTER — Ambulatory Visit (HOSPITAL_BASED_OUTPATIENT_CLINIC_OR_DEPARTMENT_OTHER)
Admission: RE | Admit: 2018-06-02 | Discharge: 2018-06-02 | Disposition: A | Discharge status: Home / Self Care | Payer: BLUE CROSS/BLUE SHIELD | Source: Other Acute Inpatient Hospital | Attending: Obstetrics & Gynecology | Admitting: Obstetrics & Gynecology

## 2018-06-02 ENCOUNTER — Telehealth: Payer: Self-pay | Admitting: Emergency Medicine

## 2018-06-02 DIAGNOSIS — R9389 Abnormal findings on diagnostic imaging of other specified body structures: Secondary | ICD-10-CM | POA: Diagnosis not present

## 2018-06-02 DIAGNOSIS — N840 Polyp of corpus uteri: Secondary | ICD-10-CM | POA: Insufficient documentation

## 2018-06-02 DIAGNOSIS — D259 Leiomyoma of uterus, unspecified: Secondary | ICD-10-CM | POA: Diagnosis not present

## 2018-06-02 DIAGNOSIS — D250 Submucous leiomyoma of uterus: Secondary | ICD-10-CM | POA: Insufficient documentation

## 2018-06-02 DIAGNOSIS — N950 Postmenopausal bleeding: Secondary | ICD-10-CM | POA: Diagnosis not present

## 2018-06-02 DIAGNOSIS — C649 Malignant neoplasm of unspecified kidney, except renal pelvis: Secondary | ICD-10-CM

## 2018-06-02 DIAGNOSIS — Z87898 Personal history of other specified conditions: Secondary | ICD-10-CM

## 2018-06-02 HISTORY — PX: DILATATION & CURETTAGE/HYSTEROSCOPY WITH MYOSURE: SHX6511

## 2018-06-02 LAB — POCT PREGNANCY, URINE: Preg Test, Ur: NEGATIVE

## 2018-06-02 SURGERY — DILATATION & CURETTAGE/HYSTEROSCOPY WITH MYOSURE
Anesthesia: Choice | Site: Vagina

## 2018-06-02 MED ORDER — MEPERIDINE HCL 25 MG/ML IJ SOLN
6.2500 mg | INTRAMUSCULAR | Status: DC | PRN
  Filled 2018-06-02: qty 1

## 2018-06-02 MED ORDER — OXYCODONE HCL 5 MG PO TABS
5.0000 mg | ORAL_TABLET | Freq: Once | ORAL | Status: DC | PRN
  Filled 2018-06-02: qty 1

## 2018-06-02 MED ORDER — ACETAMINOPHEN 160 MG/5ML PO SOLN
325.0000 mg | ORAL | Status: DC | PRN
  Filled 2018-06-02: qty 20.3

## 2018-06-02 MED ORDER — FENTANYL CITRATE (PF) 100 MCG/2ML IJ SOLN
INTRAMUSCULAR | Status: AC
  Filled 2018-06-02: qty 2

## 2018-06-02 MED ORDER — IBUPROFEN 800 MG PO TABS: 800.0000 mg | ORAL_TABLET | Freq: Three times a day (TID) | ORAL | 0 refills | Status: DC | PRN

## 2018-06-02 MED ORDER — DEXAMETHASONE SODIUM PHOSPHATE 10 MG/ML IJ SOLN
INTRAMUSCULAR | Status: AC
  Filled 2018-06-02: qty 1

## 2018-06-02 MED ORDER — KETOROLAC TROMETHAMINE 30 MG/ML IJ SOLN
INTRAMUSCULAR | Status: DC | PRN
  Administered 2018-06-02: 30 mg via INTRAVENOUS

## 2018-06-02 MED ORDER — OXYCODONE HCL 5 MG PO TABS: 5.0000 mg | ORAL_TABLET | Freq: Four times a day (QID) | ORAL | 0 refills | Status: DC | PRN

## 2018-06-02 MED ORDER — FENTANYL CITRATE (PF) 100 MCG/2ML IJ SOLN
25.0000 ug | INTRAMUSCULAR | Status: DC | PRN
  Administered 2018-06-02: 25 ug via INTRAVENOUS
  Filled 2018-06-02: qty 1

## 2018-06-02 MED ORDER — PROPOFOL 10 MG/ML IV BOLUS
INTRAVENOUS | Status: AC
  Filled 2018-06-02: qty 40

## 2018-06-02 MED ORDER — SODIUM CHLORIDE 0.9 % IV SOLN
INTRAVENOUS | Status: DC
  Administered 2018-06-02: 11:00:00 via INTRAVENOUS
  Filled 2018-06-02: qty 1000

## 2018-06-02 MED ORDER — ONDANSETRON HCL 4 MG/2ML IJ SOLN
4.0000 mg | Freq: Once | INTRAMUSCULAR | Status: DC | PRN
  Filled 2018-06-02: qty 2

## 2018-06-02 MED ORDER — SODIUM CHLORIDE 0.9 % IR SOLN
Status: DC | PRN
  Administered 2018-06-02: 3000 mL

## 2018-06-02 MED ORDER — ACETAMINOPHEN 325 MG PO TABS
325.0000 mg | ORAL_TABLET | ORAL | Status: DC | PRN
  Filled 2018-06-02: qty 2

## 2018-06-02 MED ORDER — PROPOFOL 10 MG/ML IV BOLUS
INTRAVENOUS | Status: DC | PRN
  Administered 2018-06-02: 200 mg via INTRAVENOUS

## 2018-06-02 MED ORDER — ONDANSETRON HCL 4 MG/2ML IJ SOLN
INTRAMUSCULAR | Status: AC
  Filled 2018-06-02: qty 2

## 2018-06-02 MED ORDER — MIDAZOLAM HCL 2 MG/2ML IJ SOLN
INTRAMUSCULAR | Status: DC | PRN
  Administered 2018-06-02: 2 mg via INTRAVENOUS

## 2018-06-02 MED ORDER — LIDOCAINE-EPINEPHRINE 1 %-1:100000 IJ SOLN
INTRAMUSCULAR | Status: DC | PRN
  Administered 2018-06-02: 10 mL

## 2018-06-02 MED ORDER — ONDANSETRON HCL 4 MG/2ML IJ SOLN
INTRAMUSCULAR | Status: DC | PRN
  Administered 2018-06-02: 4 mg via INTRAVENOUS

## 2018-06-02 MED ORDER — FENTANYL CITRATE (PF) 100 MCG/2ML IJ SOLN
INTRAMUSCULAR | Status: DC | PRN
  Administered 2018-06-02: 50 ug via INTRAVENOUS
  Administered 2018-06-02 (×2): 25 ug via INTRAVENOUS

## 2018-06-02 MED ORDER — KETOROLAC TROMETHAMINE 30 MG/ML IJ SOLN
INTRAMUSCULAR | Status: AC
  Filled 2018-06-02: qty 1

## 2018-06-02 MED ORDER — LIDOCAINE 2% (20 MG/ML) 5 ML SYRINGE
INTRAMUSCULAR | Status: AC
  Filled 2018-06-02: qty 5

## 2018-06-02 MED ORDER — HYDROCODONE-ACETAMINOPHEN 5-325 MG PO TABS: 1.0000 | ORAL_TABLET | Freq: Four times a day (QID) | ORAL | 0 refills | Status: DC | PRN

## 2018-06-02 MED ORDER — DEXAMETHASONE SODIUM PHOSPHATE 10 MG/ML IJ SOLN
INTRAMUSCULAR | Status: DC | PRN
  Administered 2018-06-02: 10 mg via INTRAVENOUS

## 2018-06-02 MED ORDER — LIDOCAINE 2% (20 MG/ML) 5 ML SYRINGE
INTRAMUSCULAR | Status: DC | PRN
  Administered 2018-06-02: 60 mg via INTRAVENOUS

## 2018-06-02 MED ORDER — MIDAZOLAM HCL 2 MG/2ML IJ SOLN
INTRAMUSCULAR | Status: AC
  Filled 2018-06-02: qty 2

## 2018-06-02 MED ORDER — OXYCODONE HCL 5 MG/5ML PO SOLN
5.0000 mg | Freq: Once | ORAL | Status: DC | PRN
  Filled 2018-06-02: qty 5

## 2018-06-02 MED ORDER — KETOROLAC TROMETHAMINE 30 MG/ML IJ SOLN
30.0000 mg | Freq: Once | INTRAMUSCULAR | Status: DC | PRN
  Filled 2018-06-02: qty 1

## 2018-06-02 SURGICAL SUPPLY — 23 items
BIPOLAR CUTTING LOOP 21FR (ELECTRODE)
CANISTER SUCT 3000ML PPV (MISCELLANEOUS) ×4 IMPLANT
CATH ROBINSON RED A/P 16FR (CATHETERS) IMPLANT
DEVICE MYOSURE LITE (MISCELLANEOUS) IMPLANT
DEVICE MYOSURE REACH (MISCELLANEOUS) ×2 IMPLANT
DILATOR CANAL MILEX (MISCELLANEOUS) IMPLANT
FILTER ARTHROSCOPY CONVERTOR (FILTER) ×2 IMPLANT
GLOVE ECLIPSE 6.5 STRL STRAW (GLOVE) ×4 IMPLANT
GLOVE INDICATOR 7.0 STRL GRN (GLOVE) ×2 IMPLANT
GOWN STRL REUS W/ TWL LRG LVL3 (GOWN DISPOSABLE) ×1 IMPLANT
GOWN STRL REUS W/ TWL XL LVL3 (GOWN DISPOSABLE) ×1 IMPLANT
GOWN STRL REUS W/TWL LRG LVL3 (GOWN DISPOSABLE) ×1
GOWN STRL REUS W/TWL XL LVL3 (GOWN DISPOSABLE) ×1
IV NS IRRIG 3000ML ARTHROMATIC (IV SOLUTION) ×4 IMPLANT
KIT TURNOVER CYSTO (KITS) ×2 IMPLANT
LOOP CUTTING BIPOLAR 21FR (ELECTRODE) IMPLANT
PACK VAGINAL MINOR WOMEN LF (CUSTOM PROCEDURE TRAY) ×2 IMPLANT
PAD OB MATERNITY 4.3X12.25 (PERSONAL CARE ITEMS) ×2 IMPLANT
SEAL ROD LENS SCOPE MYOSURE (ABLATOR) ×2 IMPLANT
TOWEL OR 17X24 6PK STRL BLUE (TOWEL DISPOSABLE) ×4 IMPLANT
TUBING AQUILEX INFLOW (TUBING) ×2 IMPLANT
TUBING AQUILEX OUTFLOW (TUBING) ×2 IMPLANT
WATER STERILE IRR 500ML POUR (IV SOLUTION) ×2 IMPLANT

## 2018-06-02 NOTE — H&P (Signed)
Candace Graham is an 47 y.o. female G78P2 MAAF here for hysteroscopy and polyp resection, D&C due to PMP bleeding and endometrial polyp.  Procedure, risks, benefits have been reviewed.  Questions answered.  Pt ready to proceed.  Pertinent Gynecological History: Menses: post-menopausal Bleeding: post menopausal bleeding Contraception: post menopausal status DES exposure: denies Blood transfusions: none Sexually transmitted diseases: no past history Previous GYN Procedures: hysteroscopy with polyp resection 2016  Last mammogram: normal Date: 02/22/17 Last pap: normal Date: 03/21/17 OB History: G4, P2   Menstrual History: Patient's last menstrual period was 08/15/2014.    Past Medical History:  Diagnosis Date  . Fibroid uterus   . History of anemia   . Renal cell carcinoma ([place])   . Varicose veins of left lower extremity    varicose veins    Past Surgical History:  Procedure Laterality Date  . DILATATION & CURRETTAGE/HYSTEROSCOPY WITH RESECTOCOPE N/A 04/25/2015   Procedure: DILATATION & CURETTAGE/HYSTEROSCOPY WITH RESECTOCOPE;  Surgeon: Megan Salon, MD;  Location: [facility];  Service: Gynecology;  Laterality: N/A;  . ROBOTIC ASSITED PARTIAL NEPHRECTOMY Left 07/27/2016   Procedure: XI ROBOTIC ASSITED LEFT PARTIAL NEPHRECTOMY;  Surgeon: Alexis Frock, MD;  Location: [facility];  Service: Urology;  Laterality: Left;  [name] VIDEO BRONCHOSCOPY WITH ENDOBRONCHIAL NAVIGATION N/A 09/12/2016   Procedure: VIDEO BRONCHOSCOPY WITH ENDOBRONCHIAL NAVIGATION;  Surgeon: Collene Gobble, MD;  Location: [facility];  Service: Thoracic;  Laterality: N/A;  . VIDEO BRONCHOSCOPY WITH ENDOBRONCHIAL NAVIGATION N/A 07/10/2017   Procedure: VIDEO BRONCHOSCOPY WITH ENDOBRONCHIAL NAVIGATION;  Surgeon: Collene Gobble, MD;  Location: [facility];  Service: Thoracic;  Laterality: N/A;    Family History  Problem Relation Age of Onset  . Pancreatic cancer Mother   . Hypertension Mother   . Stroke Mother   . Thyroid disease Mother    . Hypertension Brother   . Hypertension Brother   . Heart disease Father   . Arthritis Father   . Alcohol abuse Maternal Grandmother   . Esophageal cancer Maternal Grandmother   . Drug abuse Maternal Grandfather     Social History:  reports that she has never smoked. She has never used smokeless tobacco. She reports that she does not drink alcohol or use drugs.  Allergies: No Known Allergies  Medications Prior to Admission  Medication Sig Dispense Refill Last Dose  . Vitamin D, Ergocalciferol, (DRISDOL) 50000 units CAPS capsule Take 50,000 Units by mouth every Thursday.   0 Past Month at Unknown time    Review of Systems  All other systems reviewed and are negative.   Blood pressure 129/85, pulse 84, temperature 98.2 F (36.8 C), temperature source Oral, resp. rate 18, height 5\' 7"  (1.702 m), weight 104.3 kg, last menstrual period 08/15/2014, SpO2 100 %. Physical Exam  Constitutional: She is oriented to person, place, and time. She appears well-developed and well-nourished.  Cardiovascular: Normal rate and regular rhythm.  Respiratory: Effort normal and breath sounds normal.  Neurological: She is alert and oriented to person, place, and time.  Skin: Skin is warm and dry.  Psychiatric: She has a normal mood and affect.    Results for orders placed or performed during the hospital encounter of 06/02/18 (from the past 24 hour(s))  Pregnancy, urine POC     Status: None   Collection Time: 06/02/18 10:43 AM  Result Value Ref Range   Preg Test, Ur NEGATIVE NEGATIVE    No results found.  Assessment/Plan: 47 yo G4P2 MWF with PMP bleeding and endometrial polyp(s)  here for hysteroscopy, polyp resection, D&C.    Megan Salon 06/02/2018, 12:30 PM

## 2018-06-02 NOTE — Discharge Instructions (Signed)
Post-surgical Instructions, Outpatient Surgery ° °You may expect to feel dizzy, weak, and drowsy for as long as 24 hours after receiving the medicine that made you sleep (anesthetic). For the first 24 hours after your surgery:   °· Do not drive a car, ride a bicycle, participate in physical activities, or take public transportation until you are done taking narcotic pain medicines or as directed by Dr. Miller.  °· Do not drink alcohol or take tranquilizers.  °· Do not take medicine that has not been prescribed by your physicians.  °· Do not sign important papers or make important decisions while on narcotic pain medicines.  °· Have a responsible person with you.  ° °PAIN MANAGEMENT °· Motrin 800mg.  (This is the same as 4-200mg over the counter tablets of Motrin or ibuprofen.)  You may take this every eight hours or as needed for cramping.   °· Vicodin 5/325mg.  For more severe pain, take one or two tablets every four to six hours as needed for pain control.  (Remember that narcotic pain medications increase your risk of constipation.  If this becomes a problem, you may take an over the counter stool softener like Colace 100mg up to four times a day.) ° °DO'S AND DON'T'S °· Do not take a tub bath for one week.  You may shower on the first day after your surgery °· Do not do any heavy lifting for one to two weeks.  This increases the chance of bleeding. °· Do move around as you feel able.  Stairs are fine.  You may begin to exercise again as you feel able.  Do not lift any weights for two weeks. °· Do not put anything in the vagina for two weeks--no tampons, intercourse, or douching.   ° °REGULAR MEDIATIONS/VITAMINS: °· You may restart all of your regular medications as prescribed. °· You may restart all of your vitamins as you normally take them.   ° °PLEASE CALL OR SEEK MEDICAL CARE IF: °· You have persistent nausea and vomiting.  °· You have trouble eating or drinking.  °· You have an oral temperature above 100.5.   °· You have constipation that is not helped by adjusting diet or increasing fluid intake. Pain medicines are a common cause of constipation.  °· You have heavy vaginal bleeding ° ° °Post Anesthesia Home Care Instructions ° °Activity: °Get plenty of rest for the remainder of the day. A responsible adult should stay with you for 24 hours following the procedure.  °For the next 24 hours, DO NOT: °-Drive a car °-Operate machinery °-Drink alcoholic beverages °-Take any medication unless instructed by your physician °-Make any legal decisions or sign important papers. ° °Meals: °Start with liquid foods such as gelatin or soup. Progress to regular foods as tolerated. Avoid greasy, spicy, heavy foods. If nausea and/or vomiting occur, drink only clear liquids until the nausea and/or vomiting subsides. Call your physician if vomiting continues. ° °Special Instructions/Symptoms: °Your throat may feel dry or sore from the anesthesia or the breathing tube placed in your throat during surgery. If this causes discomfort, gargle with warm salt water. The discomfort should disappear within 24 hours. ° °If you had a scopolamine patch placed behind your ear for the management of post- operative nausea and/or vomiting: ° °1. The medication in the patch is effective for 72 hours, after which it should be removed.  Wrap patch in a tissue and discard in the trash. Wash hands thoroughly with soap and water. °  2. You may remove the patch earlier than 72 hours if you experience unpleasant side effects which may include dry mouth, dizziness or visual disturbances. °3. Avoid touching the patch. Wash your hands with soap and water after contact with the patch. °  ° °

## 2018-06-02 NOTE — Anesthesia Preprocedure Evaluation (Signed)
Anesthesia Evaluation  Patient identified by MRN, date of birth, ID band Patient awake    Reviewed: Allergy & Precautions, NPO status , Patient's Chart, lab work & pertinent test results  Airway Mallampati: I       Dental no notable dental hx. (+) Teeth Intact   Pulmonary neg pulmonary ROS,    Pulmonary exam normal breath sounds clear to auscultation       Cardiovascular Normal cardiovascular exam Rhythm:Regular Rate:Normal     Neuro/Psych negative neurological ROS  negative psych ROS   GI/Hepatic negative GI ROS, Neg liver ROS,   Endo/Other    Renal/GU   negative genitourinary   Musculoskeletal negative musculoskeletal ROS (+)   Abdominal (+) + obese,   Peds  Hematology negative hematology ROS (+)   Anesthesia Other Findings   Reproductive/Obstetrics negative OB ROS                             Anesthesia Physical Anesthesia Plan  ASA: II  Anesthesia Plan:    Post-op Pain Management:    Induction: Intravenous  PONV Risk Score and Plan: 4 or greater and Ondansetron, Dexamethasone, Midazolam and Scopolamine patch - Pre-op  Airway Management Planned: LMA  Additional Equipment:   Intra-op Plan:   Post-operative Plan: Extubation in OR  Informed Consent: I have reviewed the patients History and Physical, chart, labs and discussed the procedure including the risks, benefits and alternatives for the proposed anesthesia with the patient or authorized representative who has indicated his/her understanding and acceptance.   Dental advisory given  Plan Discussed with: CRNA and Surgeon  Anesthesia Plan Comments:         Anesthesia Quick Evaluation

## 2018-06-02 NOTE — Op Note (Signed)
06/02/2018  1:22 PM  PATIENT:  Candace Graham  47 y.o. female  PRE-OPERATIVE DIAGNOSIS:  PMB, thickened endometrium, endometrial polyps  POST-OPERATIVE DIAGNOSIS:  PMB, thickened endometrium, endometrial polyp, fibroid with submucosal component  PROCEDURE:  Procedure(s): DILATATION & CURETTAGE/HYSTEROSCOPY WITH MYOSURE, RESECTION OF ENDOMETRIAL POLYP AND SUBMUCOSAL FIBROID  SURGEON:  Megan Salon  ASSISTANTS: OR staff   ANESTHESIA:   general  ESTIMATED BLOOD LOSS:  5cc  BLOOD ADMINISTERED:none   FLUIDS: 800cc LR  UOP: pt voided right before going back to the OR  SPECIMEN:  Endometrial polyp, fibroid, and curettings  DISPOSITION OF SPECIMEN:  PATHOLOGY  FINDINGS: long endometrial polyp and fibroid with submucosal component  DESCRIPTION OF OPERATION: Patient was taken to the operating room.  She is placed in the supine position. SCDs were on her lower extremities and functioning properly. General anesthesia with an LMA was administered without difficulty.   Legs were then placed in the [place] in the low lithotomy position. The legs were lifted to the high lithotomy position and the Betadine prep was used on the inner thighs perineum and vagina x3. Patient was draped in a normal standard fashion. No in and out catheterization was performed as pt voided right before going back to the OR.  A bivalve speculum was placed the vagina. The anterior lip of the cervix was grasped with single-tooth tenaculum.  A paracervical block of 1% lidocaine mixed one-to-one with epinephrine (1:100,000 units).  10 cc was used total. The cervix is dilated up to #21 [facility] dilators. The endometrial cavity sounded to 8.5 cm.   A myosure diagnostic hysteroscope was obtained. Normal saline was used as a hysteroscopic fluid. The hysteroscope was advanced through the endocervical canal into the endometrial cavity. The tubal ostia on the left was noted.  There was a long polyp about the length of the  endometrial cavity noted as well a fibroid with partial submucosal component.  The hysteroscope was removed and the polyp was grasped with a polyp forceps.  This was fully removed.  Then the cavity was revisualized.  The based of the polyp was a little irregular so the Myosure lite device was obtained.  The base of the polyp was resected as well as the submucosal component of the fibroid.  Once the fibroid was flush with the endometrial cavity, the resection was complete.  Then the hysteroscope was removed. A #1 toothed curette was used to curette the cavity until rough gritty texture was noted in all quadrants. With revisualization of the hysteroscope, there were no additional intracavitary lesions noted within the endometrium.   At this point no other procedure was needed and this procedure was ended. The hysteroscope was removed. The fluid deficit was 180 cc of normal saline. The tenaculum was removed from the anterior lip of the cervix. The speculum was removed from the vagina. The prep was cleansed of the patient's skin. The legs are positioned back in the supine position. Sponge, lap, needle, initially counts were correct x2. Patient was taken to recovery in stable condition.  COUNTS:  YES  PLAN OF CARE: Transfer to PACU

## 2018-06-02 NOTE — Transfer of Care (Signed)
Immediate Anesthesia Transfer of Care Note  Patient: Candace Graham  Procedure(s) Performed: Procedure(s) (LRB): DILATATION & CURETTAGE/HYSTEROSCOPY WITH MYOSURE (N/A)  Patient Location: PACU  Anesthesia Type: General  Level of Consciousness: awake, oriented, sedated and patient cooperative  Airway & Oxygen Therapy: Patient Spontanous Breathing and Patient connected to face mask oxygen  Post-op Assessment: Report given to PACU RN and Post -op Vital signs reviewed and stable  Post vital signs: Reviewed and stable  Complications: No apparent anesthesia complications  Last Vitals:  Vitals Value Taken Time  BP 150/88 06/02/2018  1:32 PM  Temp    Pulse 89 06/02/2018  1:33 PM  Resp    SpO2 96 % 06/02/2018  1:33 PM  Vitals shown include unvalidated device data.  Last Pain:  Vitals:   06/02/18 1028  TempSrc: Oral

## 2018-06-02 NOTE — Telephone Encounter (Signed)
Dr. Sabra Heck reviewed.  Order for Oxycodone sent electronically to [place].  Spoke with Jinny Sanders Nurse at [facility], She will destroy paper prescription for Norco and will update after visit summary to reflect change in medications.   Encounter closed.

## 2018-06-02 NOTE — Anesthesia Procedure Notes (Signed)
Procedure Name: LMA Insertion Date/Time: 06/02/2018 1:02 PM Performed by: Suan Halter, CRNA Pre-anesthesia Checklist: Patient identified, Emergency Drugs available, Suction available and Patient being monitored Patient Re-evaluated:Patient Re-evaluated prior to induction Oxygen Delivery Method: Circle system utilized Preoxygenation: Pre-oxygenation with 100% oxygen Induction Type: IV induction Ventilation: Mask ventilation without difficulty LMA: LMA inserted LMA Size: 4.0 Number of attempts: 1 Airway Equipment and Method: Bite block Placement Confirmation: positive ETCO2 Tube secured with: Tape Dental Injury: Teeth and Oropharynx as per pre-operative assessment

## 2018-06-02 NOTE — Anesthesia Postprocedure Evaluation (Signed)
Anesthesia Post Note  Patient: Candace Graham  Procedure(s) Performed: DILATATION & CURETTAGE/HYSTEROSCOPY WITH MYOSURE (N/A Vagina )     Patient location during evaluation: PACU Anesthesia Type: General Level of consciousness: sedated Pain management: pain level controlled Vital Signs Assessment: post-procedure vital signs reviewed and stable Respiratory status: spontaneous breathing and respiratory function stable Cardiovascular status: stable Postop Assessment: no apparent nausea or vomiting Anesthetic complications: no    Last Vitals:  Vitals:   06/02/18 1415 06/02/18 1430  BP: 138/79 128/83  Pulse: 62 73  Resp: 15 20  Temp:    SpO2: 94% 94%    Last Pain:  Vitals:   06/02/18 1430  TempSrc:   PainSc: 0-No pain                 Fredericka Bottcher DANIEL

## 2018-06-02 NOTE — Telephone Encounter (Signed)
RN calling from [facility].  Pt requesting Rx for Norco be changed to Roxicodone so that she does not take any Tylenol.  Call back [phone].

## 2018-06-03 ENCOUNTER — Encounter (HOSPITAL_BASED_OUTPATIENT_CLINIC_OR_DEPARTMENT_OTHER): Payer: Self-pay | Admitting: Obstetrics & Gynecology

## 2018-06-04 ENCOUNTER — Telehealth: Payer: Self-pay | Admitting: *Deleted

## 2018-06-04 NOTE — Telephone Encounter (Signed)
Notes recorded by Burnice Logan, RN on 06/04/2018 at 12:34 PM EDT Left message to call Sharee Pimple at [phone].

## 2018-06-04 NOTE — Telephone Encounter (Signed)
-----   Message from Megan Salon, MD sent at 06/04/2018  6:17 AM EDT ----- Please let pt know her pathology showed a benign polyp and normal endometrial tissue as well.  There were no other abnormalities.  Can you see how she is doing from a post op standpoint as well.  Thanks.

## 2018-06-04 NOTE — Telephone Encounter (Signed)
Patient given results per Dr. Sabra Heck.  She states she is doing very and offers thanks to Dr. Sabra Heck. She will call back prn and follow up for post op appointment.   Encounter to Dr. Sabra Heck and will close.

## 2018-06-11 ENCOUNTER — Telehealth: Payer: Self-pay | Admitting: Obstetrics & Gynecology

## 2018-06-11 NOTE — Telephone Encounter (Signed)
Call placed to [facility] [phone] option #1) to follow up in reference to a referral for patient to see Dr Eulis Manly. I was advised by Tillie Rung, in scheduling, they left a voicemail message for the patient, requesting a return call and then a letter was mailed to the patient to advise her to call for scheduling. Tillie Rung could not provide the date the voicemail was placed or the date the letter was mailed.  After call to [facility], I contacted the patient myself. She acknowledges receiving voicemail message, but was not clear on what to do therefore did not return the call. Patient states she has not received a letter.  I have provided to patient the phone number of [phone] to call for scheduling at [facility]. I have also advised patient to select option #1 for the Pulmonary Clinic. Patient is agreeable, stating she will call to schedule an appointment with Dr Deatra Canter.  Forwarding to Dr Sabra Heck for review.

## 2018-06-12 NOTE — Telephone Encounter (Signed)
Thank you for this update.  Encounter can be closed.

## 2018-06-13 ENCOUNTER — Other Ambulatory Visit: Payer: Self-pay | Admitting: Family Medicine

## 2018-06-13 DIAGNOSIS — Z1231 Encounter for screening mammogram for malignant neoplasm of breast: Secondary | ICD-10-CM

## 2018-06-19 ENCOUNTER — Ambulatory Visit (INDEPENDENT_AMBULATORY_CARE_PROVIDER_SITE_OTHER): Payer: BLUE CROSS/BLUE SHIELD | Admitting: Obstetrics & Gynecology

## 2018-06-19 ENCOUNTER — Ambulatory Visit: Payer: BLUE CROSS/BLUE SHIELD | Admitting: Obstetrics & Gynecology

## 2018-06-19 ENCOUNTER — Other Ambulatory Visit: Payer: Self-pay

## 2018-06-19 ENCOUNTER — Encounter: Payer: Self-pay | Admitting: Obstetrics & Gynecology

## 2018-06-19 VITALS — BP 130/70 | HR 76 | Resp 14 | Ht 67.5 in | Wt 230.2 lb

## 2018-06-19 DIAGNOSIS — N840 Polyp of corpus uteri: Secondary | ICD-10-CM | POA: Diagnosis not present

## 2018-06-19 NOTE — Progress Notes (Signed)
Post Operative Visit  Procedure: Dilatation and Curettage/Hysteroscopy with Myosure.  Days Post-op: 17  Subjective: Doing well.  Denies vaginal bleeding.  Spotted for a few days post op.  Pathology reviewed with pt.  Denies patient.  Voiding and bowel function are normal.  Denies vaginal discharge.  She has not called [facility] yet to get a pulmonology appt.  She has received several phone calls and has not returned then yet for an appt.  Highly encouraged her to do this and make the appt.  States she will.  Objective: BP 130/70 (BP Location: Right Arm, Patient Position: Sitting, Cuff Size: Large)   Pulse 76   Resp 14   Ht 5' 7.5" (1.715 m)   Wt 230 lb 3.2 oz (104.4 kg)   LMP 08/15/2014   BMI 35.52 kg/m   EXAM General: alert and no distress Resp: clear to auscultation bilaterally Cardio: regular rate and rhythm, S1, S2 normal, no murmur, click, rub or gallop GI: soft, non-tender; bowel sounds normal; no masses,  no organomegaly Extremities: extremities normal, atraumatic, no cyanosis or edema Vaginal Bleeding: none  Gyn:  NAEFG, vaginal without lesion, no masses or discharge, cervix closed, no masses, uterus enlarged and non tender  Assessment: s/p hysteroscopy with polyp resection, D&C  Plan: Follow up for AEX or any new issues/problems.

## 2018-06-25 ENCOUNTER — Telehealth: Payer: Self-pay | Admitting: Obstetrics & Gynecology

## 2018-06-25 NOTE — Telephone Encounter (Signed)
Call placed in reference to a referral. °

## 2018-07-11 ENCOUNTER — Ambulatory Visit
Admission: RE | Admit: 2018-07-11 | Discharge: 2018-07-11 | Disposition: A | Discharge status: Home / Self Care | Payer: BLUE CROSS/BLUE SHIELD | Source: Ambulatory Visit | Attending: Family Medicine | Admitting: Family Medicine

## 2018-07-11 DIAGNOSIS — Z1231 Encounter for screening mammogram for malignant neoplasm of breast: Secondary | ICD-10-CM | POA: Diagnosis not present

## 2018-07-21 DIAGNOSIS — J984 Other disorders of lung: Secondary | ICD-10-CM | POA: Diagnosis not present

## 2018-07-21 DIAGNOSIS — R918 Other nonspecific abnormal finding of lung field: Secondary | ICD-10-CM | POA: Diagnosis not present

## 2018-07-21 DIAGNOSIS — R911 Solitary pulmonary nodule: Secondary | ICD-10-CM | POA: Diagnosis not present

## 2018-07-22 NOTE — Telephone Encounter (Signed)
See referral notes in referral file. Patient is scheduled for an evaluation at [facility] with Dr Geryl Councilman on 09/02/18. Patient states she was originally scheduled to see Dr Geryl Councilman, the provider [facility] scheduled her with,  on 07/22/18, but patient has to reschedule, due to transportation conflict.   Forwarding to Dr Sabra Heck for final review. Will close encounter

## 2018-08-04 DIAGNOSIS — Z13220 Encounter for screening for lipoid disorders: Secondary | ICD-10-CM | POA: Diagnosis not present

## 2018-08-04 DIAGNOSIS — Z0000 Encounter for general adult medical examination without abnormal findings: Secondary | ICD-10-CM | POA: Diagnosis not present

## 2018-09-02 DIAGNOSIS — R918 Other nonspecific abnormal finding of lung field: Secondary | ICD-10-CM | POA: Diagnosis not present

## 2018-09-02 DIAGNOSIS — C642 Malignant neoplasm of left kidney, except renal pelvis: Secondary | ICD-10-CM | POA: Diagnosis not present

## 2018-09-09 ENCOUNTER — Other Ambulatory Visit: Payer: Self-pay | Admitting: Pulmonary Disease

## 2018-09-09 DIAGNOSIS — R918 Other nonspecific abnormal finding of lung field: Secondary | ICD-10-CM

## 2018-09-17 ENCOUNTER — Ambulatory Visit (HOSPITAL_COMMUNITY)
Admission: RE | Admit: 2018-09-17 | Discharge: 2018-09-17 | Disposition: A | Discharge status: Home / Self Care | Payer: BLUE CROSS/BLUE SHIELD | Source: Ambulatory Visit | Attending: Pulmonary Disease | Admitting: Pulmonary Disease

## 2018-09-17 ENCOUNTER — Encounter (HOSPITAL_COMMUNITY): Payer: Self-pay

## 2018-09-17 DIAGNOSIS — R918 Other nonspecific abnormal finding of lung field: Secondary | ICD-10-CM | POA: Insufficient documentation

## 2018-10-01 DIAGNOSIS — Z23 Encounter for immunization: Secondary | ICD-10-CM | POA: Diagnosis not present

## 2018-10-01 DIAGNOSIS — J8410 Pulmonary fibrosis, unspecified: Secondary | ICD-10-CM | POA: Diagnosis not present

## 2018-10-29 DIAGNOSIS — R911 Solitary pulmonary nodule: Secondary | ICD-10-CM | POA: Diagnosis not present

## 2018-11-17 DIAGNOSIS — R911 Solitary pulmonary nodule: Secondary | ICD-10-CM | POA: Diagnosis not present

## 2019-02-09 DIAGNOSIS — R609 Edema, unspecified: Secondary | ICD-10-CM | POA: Diagnosis not present

## 2019-02-09 DIAGNOSIS — C642 Malignant neoplasm of left kidney, except renal pelvis: Secondary | ICD-10-CM | POA: Diagnosis not present

## 2019-03-04 DIAGNOSIS — D472 Monoclonal gammopathy: Secondary | ICD-10-CM | POA: Diagnosis not present

## 2019-03-18 DIAGNOSIS — D472 Monoclonal gammopathy: Secondary | ICD-10-CM | POA: Diagnosis not present

## 2019-04-20 DIAGNOSIS — D649 Anemia, unspecified: Secondary | ICD-10-CM | POA: Diagnosis not present

## 2019-04-20 DIAGNOSIS — D472 Monoclonal gammopathy: Secondary | ICD-10-CM | POA: Diagnosis not present

## 2019-04-20 DIAGNOSIS — R918 Other nonspecific abnormal finding of lung field: Secondary | ICD-10-CM | POA: Diagnosis not present

## 2019-04-20 DIAGNOSIS — D7589 Other specified diseases of blood and blood-forming organs: Secondary | ICD-10-CM | POA: Diagnosis not present

## 2019-04-23 DIAGNOSIS — R918 Other nonspecific abnormal finding of lung field: Secondary | ICD-10-CM | POA: Diagnosis not present

## 2019-04-23 DIAGNOSIS — C649 Malignant neoplasm of unspecified kidney, except renal pelvis: Secondary | ICD-10-CM | POA: Diagnosis not present

## 2019-04-23 DIAGNOSIS — C642 Malignant neoplasm of left kidney, except renal pelvis: Secondary | ICD-10-CM | POA: Diagnosis not present

## 2019-04-23 DIAGNOSIS — D1809 Hemangioma of other sites: Secondary | ICD-10-CM | POA: Diagnosis not present

## 2019-05-04 ENCOUNTER — Telehealth: Payer: Self-pay | Admitting: Obstetrics & Gynecology

## 2019-05-04 NOTE — Telephone Encounter (Signed)
I called her personally.  She is still being evaluated for her pulmonary nodules.  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  Bone marrow biopsy being planned.  Just have follow up with Dr. Tresa Moore and CT scan for h/o renal cell carcinoma.  Significant other died in single vehicle MVA in 09/30/2023.  She is dating someone new and happy.  Sons are doing well  She soes not need appt.  Just wanted to give me an update.  Ok to cancel web visit.  Thank you.

## 2019-05-04 NOTE — Telephone Encounter (Signed)
Patient is calling regarding a referral that Dr. Sabra Heck initiated. Patient did not give any further information.

## 2019-05-04 NOTE — Telephone Encounter (Signed)
Called patient to see if I could help regarding ?referral. She states she would like to speak with Dr.Miller regarding referral she did for her at the end of last year. I asked if it was the referral to pulmonologist and she states yes. She just wants to give Dr.Miller an update. Advised maybe we can do a Web visit. Scheduled Web visit with [place] on 05-12-19 4:30pm. Will let Dr.Miller know and if this is not appropriate will call patient back.  Routing to provider to sign and close.

## 2019-05-05 NOTE — Telephone Encounter (Signed)
WebEx cancelled for 05/12/19.  Encounter closed.

## 2019-05-12 ENCOUNTER — Telehealth: Payer: BC Managed Care – PPO | Admitting: Obstetrics & Gynecology

## 2019-06-05 DIAGNOSIS — D472 Monoclonal gammopathy: Secondary | ICD-10-CM | POA: Diagnosis not present

## 2019-11-17 ENCOUNTER — Other Ambulatory Visit: Payer: Self-pay | Admitting: Family Medicine

## 2019-11-17 DIAGNOSIS — Z1231 Encounter for screening mammogram for malignant neoplasm of breast: Secondary | ICD-10-CM

## 2019-12-24 ENCOUNTER — Other Ambulatory Visit: Payer: Self-pay

## 2019-12-24 ENCOUNTER — Ambulatory Visit
Admission: RE | Admit: 2019-12-24 | Discharge: 2019-12-24 | Disposition: A | Discharge status: Home / Self Care | Payer: 59 | Source: Ambulatory Visit | Attending: Family Medicine | Admitting: Family Medicine

## 2019-12-24 DIAGNOSIS — Z1231 Encounter for screening mammogram for malignant neoplasm of breast: Secondary | ICD-10-CM

## 2020-01-01 DIAGNOSIS — C884 Extranodal marginal zone b-cell lymphoma of mucosa-associated lymphoid tissue (malt-lymphoma) not having achieved remission: Secondary | ICD-10-CM | POA: Insufficient documentation

## 2020-01-04 ENCOUNTER — Encounter: Payer: Self-pay | Admitting: Certified Nurse Midwife

## 2020-05-09 NOTE — Progress Notes (Signed)
49 y.o. [id number] Married Candace Graham or [place] American female here for annual exam.  Being followed at [facility] by Dr. 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  Seen in April.  No treatment recommended.  Follow up 6 months recommended.    Saw urology about two weeks.  CT scan and blood work was done about two weeks ago.  She reports "everything was good.  Denies vaginal bleeding.    PCP:  Dr. Nancy Fetter.    Patient's last menstrual period was 08/15/2014.          Sexually active: Yes.    The current method of family planning is post menopausal status.    Exercising: No.  The patient does not participate in regular exercise at present. Smoker:  no  Health Maintenance: Pap:  03-20-16 neg, 03-21-17 neg HPV HR neg History of abnormal Pap:  no MMG:  12-25-2019 category a density birads 1:neg Colonoscopy:  Discussed with pt today BMD:   none TDaP:  2018 Pneumonia vaccine(s):  no Shingrix:   no Hep C testing: not done Screening Labs: discuss today   reports that she has never smoked. She has never used smokeless tobacco. She reports that she does not drink alcohol and does not use drugs.  Past Medical History:  Diagnosis Date  . Fibroid uterus   . History of anemia   . Renal cell carcinoma ([place])   . Varicose veins of left lower extremity    varicose veins    Past Surgical History:  Procedure Laterality Date  . DILATATION & CURETTAGE/HYSTEROSCOPY WITH MYOSURE N/A 06/02/2018   Procedure: DILATATION & CURETTAGE/HYSTEROSCOPY WITH MYOSURE;  Surgeon: Megan Salon, MD;  Location: [facility];  Service: Gynecology;  Laterality: N/A;  . DILATATION & CURRETTAGE/HYSTEROSCOPY WITH RESECTOCOPE N/A 04/25/2015   Procedure: DILATATION & CURETTAGE/HYSTEROSCOPY WITH RESECTOCOPE;  Surgeon: Megan Salon, MD;  Location: [facility];  Service: Gynecology;  Laterality: N/A;  . ROBOTIC ASSITED PARTIAL NEPHRECTOMY Left  07/27/2016   Procedure: XI ROBOTIC ASSITED LEFT PARTIAL NEPHRECTOMY;  Surgeon: Alexis Frock, MD;  Location: [facility];  Service: Urology;  Laterality: Left;  [name] VIDEO BRONCHOSCOPY WITH ENDOBRONCHIAL NAVIGATION N/A 09/12/2016   Procedure: VIDEO BRONCHOSCOPY WITH ENDOBRONCHIAL NAVIGATION;  Surgeon: Collene Gobble, MD;  Location: [facility];  Service: Thoracic;  Laterality: N/A;  . VIDEO BRONCHOSCOPY WITH ENDOBRONCHIAL NAVIGATION N/A 07/10/2017   Procedure: VIDEO BRONCHOSCOPY WITH ENDOBRONCHIAL NAVIGATION;  Surgeon: Collene Gobble, MD;  Location: [facility];  Service: Thoracic;  Laterality: N/A;    Current Outpatient Medications  Medication Sig Dispense Refill  . hydrochlorothiazide (HYDRODIURIL) 25 MG tablet Take 25 mg by mouth every morning.    . Misc Natural Products (TUMERSAID) TABS Take by mouth.    . Multiple Vitamin (MULTIVITAMIN) tablet Take 1 tablet by mouth daily.    . Vitamin D, Ergocalciferol, (DRISDOL) 50000 units CAPS capsule Take 50,000 Units by mouth every Thursday.   0   No current facility-administered medications for this visit.    Family History  Problem Relation Age of Onset  . Pancreatic cancer Mother   . Hypertension Mother   . Stroke Mother   . Thyroid disease Mother   . Hypertension Brother   . Hypertension Brother   . Heart disease Father   . Arthritis Father   . Alcohol abuse Maternal Grandmother   . Esophageal cancer Maternal Grandmother   . Drug abuse Maternal Grandfather     Review  of Systems  All other systems reviewed and are negative.   Exam:   BP 116/70 (BP Location: Right Arm, Patient Position: Sitting, Cuff Size: Normal)   Pulse 80   Resp 14   Ht 5\' 7"  (1.702 m)   Wt (!) 220 lb (99.8 kg)   LMP 08/15/2014   BMI 34.46 kg/m   Height: 5\' 7"  (170.2 cm)  General appearance: alert, cooperative and appears stated age Head: Normocephalic, without obvious abnormality, atraumatic Neck: no adenopathy, supple, symmetrical, trachea midline and thyroid normal to  inspection and palpation Lungs: clear to auscultation bilaterally Breasts: normal appearance, no masses or tenderness Heart: regular rate and rhythm Abdomen: soft, non-tender; bowel sounds normal; no masses,  no organomegaly Extremities: extremities normal, atraumatic, no cyanosis or edema Skin: Skin color, texture, turgor normal. No rashes or lesions Lymph nodes: Cervical, supraclavicular, and axillary nodes normal. No abnormal inguinal nodes palpated Neurologic: Grossly normal   Pelvic: External genitalia:  no lesions              Urethra:  normal appearing urethra with no masses, tenderness or lesions              Bartholins and Skenes: normal                 Vagina: normal appearing vagina with normal color and discharge, no lesions              Cervix: no lesions              Pap taken: Yes.   Bimanual Exam:  Uterus: mildly irregular uterus about 8-10 weeks, mobile, non tender              Adnexa: normal adnexa and no mass, fullness, tenderness               Rectovaginal: Confirms               Anus:  normal sphincter tone, no lesions  Chaperone, Terence Lux, CMA, was present for exam.  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 Elita Boone at [place] discussed.  She declines any vaccination at this time. Return annually or prn

## 2020-05-10 ENCOUNTER — Ambulatory Visit: Payer: 59 | Admitting: Obstetrics & Gynecology

## 2020-05-10 ENCOUNTER — Other Ambulatory Visit (HOSPITAL_COMMUNITY)
Admission: RE | Admit: 2020-05-10 | Discharge: 2020-05-10 | Disposition: A | Discharge status: Home / Self Care | Payer: 59 | Source: Ambulatory Visit | Attending: Obstetrics & Gynecology | Admitting: Obstetrics & Gynecology

## 2020-05-10 ENCOUNTER — Encounter: Payer: Self-pay | Admitting: Obstetrics & Gynecology

## 2020-05-10 ENCOUNTER — Other Ambulatory Visit: Payer: Self-pay

## 2020-05-10 VITALS — BP 116/70 | HR 80 | Resp 14 | Ht 67.0 in | Wt 220.0 lb

## 2020-05-10 DIAGNOSIS — Z124 Encounter for screening for malignant neoplasm of cervix: Secondary | ICD-10-CM | POA: Diagnosis not present

## 2020-05-10 DIAGNOSIS — Z0000 Encounter for general adult medical examination without abnormal findings: Secondary | ICD-10-CM | POA: Diagnosis not present

## 2020-05-10 DIAGNOSIS — Z01419 Encounter for gynecological examination (general) (routine) without abnormal findings: Secondary | ICD-10-CM | POA: Diagnosis not present

## 2020-05-10 DIAGNOSIS — Z1211 Encounter for screening for malignant neoplasm of colon: Secondary | ICD-10-CM

## 2020-05-10 NOTE — Patient Instructions (Addendum)
[place], [place]: [phone] Behavioral Medicine: [phone] Fax: [phone] Hours: [place] (M, Th, F); 7am - 6pm ([place])  When you see Dr. Elita Boone, does she have any opinion about the pneumovax (pneumonia vaccine).

## 2020-05-11 LAB — LIPID PANEL
Chol/HDL Ratio: 4.1 ratio (ref 0.0–4.4)
Cholesterol, Total: 199 mg/dL (ref 100–199)
HDL: 49 mg/dL (ref >39)
LDL Chol Calc (NIH): 138 mg/dL — ABNORMAL HIGH (ref 0–99)
Triglycerides: 63 mg/dL (ref 0–149)
VLDL Cholesterol Cal: 12 mg/dL (ref 5–40)

## 2020-05-11 LAB — TSH: TSH: 2.28 u[IU]/mL (ref 0.450–4.500)

## 2020-05-11 LAB — CYTOLOGY - PAP
Comment: NEGATIVE
Diagnosis: NEGATIVE
High risk HPV: NEGATIVE

## 2020-05-11 LAB — HEMOGLOBIN A1C
Est. average glucose Bld gHb Est-mCnc: 123 mg/dL
Hgb A1c MFr Bld: 5.9 % — ABNORMAL HIGH (ref 4.8–5.6)

## 2020-05-11 LAB — VITAMIN D 25 HYDROXY (VIT D DEFICIENCY, FRACTURES): Vit D, 25-Hydroxy: 16.6 ng/mL — ABNORMAL LOW (ref 30.0–100.0)

## 2020-05-14 ENCOUNTER — Encounter: Payer: Self-pay | Admitting: Obstetrics & Gynecology

## 2020-05-16 ENCOUNTER — Other Ambulatory Visit: Payer: Self-pay | Admitting: Obstetrics & Gynecology

## 2020-05-16 MED ORDER — FLUCONAZOLE 150 MG PO TABS
150.0000 mg | ORAL_TABLET | Freq: Once | ORAL | 0 refills | Status: AC
Start: 2020-05-16 — End: 2020-05-16

## 2020-05-16 NOTE — Progress Notes (Signed)
Diflucan rx sent to pharmacy on file.

## 2020-05-16 NOTE — Telephone Encounter (Signed)
Dr.Miller,  Okay to send in Diflucan 150 mg po x 1 repeat in 72 hours as needed for patient?

## 2020-08-16 ENCOUNTER — Ambulatory Visit: Payer: 59 | Admitting: Registered"

## 2020-09-12 NOTE — Progress Notes (Signed)
GYNECOLOGY  VISIT  CC:   Vaginal discharge  HPI: 49 y.o. [id number] Single Black or African American female here for vaginal discharge.   Sexually active and would like STD check Denies pain or irriation Denies post menopausal bleeding  GYNECOLOGIC HISTORY: Patient's last menstrual period was 08/15/2014. Contraception: post menopausal Menopausal hormone therapy: none  Patient Active Problem List   Diagnosis Date Noted  . 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 History of prediabetes 06/02/2018  . Multiple lung nodules 06/27/2016  . Renal cell carcinoma ([place]) 06/27/2016  . Back pain 04/28/2015  . Obesity 04/28/2015  . Abnormal perimenopausal bleeding 04/19/2015  . Fibroids 02/16/2013    Past Medical History:  Diagnosis Date  . Fibroid uterus   . History of anemia   . Renal cell carcinoma ([place])   . Varicose veins of left lower extremity    varicose veins    Past Surgical History:  Procedure Laterality Date  . DILATATION & CURETTAGE/HYSTEROSCOPY WITH MYOSURE N/A 06/02/2018   Procedure: DILATATION & CURETTAGE/HYSTEROSCOPY WITH MYOSURE;  Surgeon: Megan Salon, MD;  Location: [facility];  Service: Gynecology;  Laterality: N/A;  . DILATATION & CURRETTAGE/HYSTEROSCOPY WITH RESECTOCOPE N/A 04/25/2015   Procedure: DILATATION & CURETTAGE/HYSTEROSCOPY WITH RESECTOCOPE;  Surgeon: Megan Salon, MD;  Location: [facility];  Service: Gynecology;  Laterality: N/A;  . ROBOTIC ASSITED PARTIAL NEPHRECTOMY Left 07/27/2016   Procedure: XI ROBOTIC ASSITED LEFT PARTIAL NEPHRECTOMY;  Surgeon: Alexis Frock, MD;  Location: [facility];  Service: Urology;  Laterality: Left;  [name] VIDEO BRONCHOSCOPY WITH ENDOBRONCHIAL NAVIGATION N/A 09/12/2016   Procedure: VIDEO BRONCHOSCOPY WITH ENDOBRONCHIAL NAVIGATION;  Surgeon: Collene Gobble, MD;  Location: [place];  Service: Thoracic;  Laterality: N/A;  . VIDEO BRONCHOSCOPY WITH ENDOBRONCHIAL  NAVIGATION N/A 07/10/2017   Procedure: VIDEO BRONCHOSCOPY WITH ENDOBRONCHIAL NAVIGATION;  Surgeon: Collene Gobble, MD;  Location: [facility];  Service: Thoracic;  Laterality: N/A;    MEDS:   Current Outpatient Medications on File Prior to Visit  Medication Sig Dispense Refill  . hydrochlorothiazide (HYDRODIURIL) 12.5 MG tablet Take 12.5 mg by mouth daily.    . Multiple Vitamin (MULTIVITAMIN) tablet Take 1 tablet by mouth daily.    [name] VITAMIN D PO Take by mouth.     No current facility-administered medications on file prior to visit.    ALLERGIES: Patient has no known allergies.  Family History  Problem Relation Age of Onset  . Pancreatic cancer Mother   . Hypertension Mother   . Stroke Mother   . Thyroid disease Mother   . Hypertension Brother   . Hypertension Brother   . Heart disease Father   . Arthritis Father   . Alcohol abuse Maternal Grandmother   . Esophageal cancer Maternal Grandmother   . Drug abuse Maternal Grandfather      Review of Systems  Constitutional: Negative.   HENT: Negative.   Eyes: Negative.   Respiratory: Negative.   Cardiovascular: Negative.   Gastrointestinal: Negative.   Endocrine: Negative.   Genitourinary:       Vaginal discharge  Musculoskeletal: Negative.   Skin: Negative.   Allergic/Immunologic: Negative.   Neurological: Negative.   Hematological: Negative.   Psychiatric/Behavioral: Negative.     PHYSICAL EXAMINATION:    BP 118/72   Pulse 70   Resp 16   Wt 226 lb (102.5 kg)   LMP 08/15/2014   BMI 35.40 kg/m     General appearance: alert, cooperative and appears  stated age  Lymph:  no inguinal LAD noted  Pelvic: External genitalia:  no lesions              Urethra:  normal appearing urethra with no masses, tenderness or lesions              Bartholins and Skenes: normal                 Vagina: normal appearing vagina , small amount white discharge              Cervix: no cervical motion tenderness and no lesions               Bimanual Exam:  Uterus:  enlarged, ~10 week size, irregular shape weeks size and firm              Adnexa: no mass, fullness, tenderness              ns  Chaperone, Royal Hawthorn, CMA, was present for exam.  Assessment: STD screening  Plan: Affirm NAAT GC/CT HIV, RPR Will notify if treatment is indicated

## 2020-09-13 ENCOUNTER — Ambulatory Visit: Payer: 59 | Admitting: Nurse Practitioner

## 2020-09-13 ENCOUNTER — Other Ambulatory Visit: Payer: Self-pay

## 2020-09-13 ENCOUNTER — Encounter: Payer: Self-pay | Admitting: Nurse Practitioner

## 2020-09-13 VITALS — BP 118/72 | HR 70 | Resp 16 | Wt 226.0 lb

## 2020-09-13 DIAGNOSIS — Z113 Encounter for screening for infections with a predominantly sexual mode of transmission: Secondary | ICD-10-CM | POA: Diagnosis not present

## 2020-09-13 NOTE — Patient Instructions (Addendum)
Check out Dr. Stark Klein:  [email or web address]  He is a great [place] Curia that seems to have a similar health perspective. You may enjoy his books.  Your results of testing today will arrive in [place], I will send you a message when they are all in.

## 2020-09-14 LAB — HIV ANTIBODY (ROUTINE TESTING W REFLEX): HIV Screen 4th Generation wRfx: NONREACTIVE

## 2020-09-14 LAB — RPR: RPR Ser Ql: NONREACTIVE

## 2020-09-15 LAB — GC/CHLAMYDIA PROBE AMP
Chlamydia trachomatis, NAA: NEGATIVE
Neisseria Gonorrhoeae by PCR: NEGATIVE

## 2020-09-16 LAB — VAGINITIS/VAGINOSIS, DNA PROBE
Candida Species: NEGATIVE
Gardnerella vaginalis: NEGATIVE
Trichomonas vaginosis: NEGATIVE

## 2020-12-14 ENCOUNTER — Encounter (HOSPITAL_COMMUNITY): Payer: 59

## 2020-12-14 ENCOUNTER — Encounter: Payer: 59 | Admitting: Vascular Surgery

## 2020-12-15 ENCOUNTER — Ambulatory Visit (INDEPENDENT_AMBULATORY_CARE_PROVIDER_SITE_OTHER): Payer: 59 | Admitting: Obstetrics & Gynecology

## 2020-12-15 ENCOUNTER — Other Ambulatory Visit: Payer: Self-pay

## 2020-12-15 ENCOUNTER — Encounter (HOSPITAL_BASED_OUTPATIENT_CLINIC_OR_DEPARTMENT_OTHER): Payer: Self-pay | Admitting: Obstetrics & Gynecology

## 2020-12-15 ENCOUNTER — Other Ambulatory Visit (HOSPITAL_COMMUNITY)
Admission: RE | Admit: 2020-12-15 | Discharge: 2020-12-15 | Disposition: A | Discharge status: Home / Self Care | Payer: 59 | Source: Ambulatory Visit | Attending: Obstetrics & Gynecology | Admitting: Obstetrics & Gynecology

## 2020-12-15 VITALS — BP 111/75 | HR 77 | Ht 66.5 in | Wt 220.0 lb

## 2020-12-15 DIAGNOSIS — N898 Other specified noninflammatory disorders of vagina: Secondary | ICD-10-CM | POA: Insufficient documentation

## 2020-12-15 DIAGNOSIS — D251 Intramural leiomyoma of uterus: Secondary | ICD-10-CM

## 2020-12-15 DIAGNOSIS — Z1211 Encounter for screening for malignant neoplasm of colon: Secondary | ICD-10-CM

## 2020-12-15 NOTE — Progress Notes (Signed)
GYNECOLOGY  VISIT  CC:   Several questions/concerns  HPI: 50 y.o. [id number] Single Black or African American female here for comlaint of vaginal discharge that has been present for several months.  She did have vaginal STD testing and affirm obtained.  These were negative.  At times feels like it is significant.  No odor.  +SA.  Partner not new since STD testing.  Denies bladder symptoms.  Does sometimes have pelvic discomfort/cramping, almost like going to start cycle.  Wants to make sure this is not anything.  [facility]ologist, Dr. Juanell Fairly, has diagnosed her with MALT--Marginal extranodal B-cell lymphoma.  Pt is just being monitored for now.  States she is trying to live her "best life" and not worry.  Considering having plastic surgery with provider from [place]  Was just there two weeks ago.  D/w pt my experience with this an other patients.  Last pap was 04/2020.  Neg with neg HR HPV.  Denies vaginal bleeding.  Pt did not go for colonoscopy last year.  Ready to be referred again.  Female provider preferred.  GYNECOLOGIC HISTORY: Patient's last menstrual period was 08/15/2014. Contraception: PMP Menopausal hormone therapy: none  Patient Active Problem List   Diagnosis Date Noted  . 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 History of prediabetes 06/02/2018  . Multiple lung nodules 06/27/2016  . Renal cell carcinoma ([place]) 06/27/2016  . Back pain 04/28/2015  . Obesity 04/28/2015  . Abnormal perimenopausal bleeding 04/19/2015  . Fibroids 02/16/2013    Past Medical History:  Diagnosis Date  . Fibroid uterus   . History of anemia   . Renal cell carcinoma ([place])   . Varicose veins of left lower extremity    varicose veins    Past Surgical History:  Procedure Laterality Date  . DILATATION & CURETTAGE/HYSTEROSCOPY WITH MYOSURE N/A 06/02/2018   Procedure: DILATATION & CURETTAGE/HYSTEROSCOPY WITH MYOSURE;   Surgeon: Megan Salon, MD;  Location: [facility];  Service: Gynecology;  Laterality: N/A;  . DILATATION & CURRETTAGE/HYSTEROSCOPY WITH RESECTOCOPE N/A 04/25/2015   Procedure: DILATATION & CURETTAGE/HYSTEROSCOPY WITH RESECTOCOPE;  Surgeon: Megan Salon, MD;  Location: [facility];  Service: Gynecology;  Laterality: N/A;  . ROBOTIC ASSITED PARTIAL NEPHRECTOMY Left 07/27/2016   Procedure: XI ROBOTIC ASSITED LEFT PARTIAL NEPHRECTOMY;  Surgeon: Alexis Frock, MD;  Location: [facility];  Service: Urology;  Laterality: Left;  [name] VIDEO BRONCHOSCOPY WITH ENDOBRONCHIAL NAVIGATION N/A 09/12/2016   Procedure: VIDEO BRONCHOSCOPY WITH ENDOBRONCHIAL NAVIGATION;  Surgeon: Collene Gobble, MD;  Location: [facility];  Service: Thoracic;  Laterality: N/A;  . VIDEO BRONCHOSCOPY WITH ENDOBRONCHIAL NAVIGATION N/A 07/10/2017   Procedure: VIDEO BRONCHOSCOPY WITH ENDOBRONCHIAL NAVIGATION;  Surgeon: Collene Gobble, MD;  Location: [facility];  Service: Thoracic;  Laterality: N/A;    MEDS:   Current Outpatient Medications on File Prior to Visit  Medication Sig Dispense Refill  . hydrochlorothiazide (HYDRODIURIL) 12.5 MG tablet Take 12.5 mg by mouth daily.    . Multiple Vitamin (MULTIVITAMIN) tablet Take 1 tablet by mouth daily.    [name] VITAMIN D PO Take by mouth.     No current facility-administered medications on file prior to visit.    ALLERGIES: Patient has no known allergies.  Family History  Problem Relation Age of Onset  . Pancreatic cancer Mother   . Hypertension Mother   . Stroke Mother   . Thyroid disease Mother   . Hypertension Brother   . Hypertension Brother   .  Heart disease Father   . Arthritis Father   . Alcohol abuse Maternal Grandmother   . Esophageal cancer Maternal Grandmother   . Drug abuse Maternal Grandfather     SH:  Single, non smoker  Review of Systems  Genitourinary: Positive for vaginal discharge.  All other systems reviewed and are negative.   PHYSICAL EXAMINATION:    BP  111/75   Pulse 77   Ht 5' 6.5" (1.689 m)   Wt 220 lb (99.8 kg)   LMP 08/15/2014   BMI 34.98 kg/m     General appearance: alert, cooperative and appears stated age Lymph:  no inguinal LAD noted  Pelvic: External genitalia:  no lesions              Urethra:  normal appearing urethra with no masses, tenderness or lesions              Bartholins and Skenes: normal                 Vagina: normal appearing vagina with whitish vaginal discharge noted, no lesions              Cervix: no lesions              Bimanual Exam:  Uterus:  enlarged, 8 weeks weeks size, slightly irregular              Adnexa: no mass, fullness, tenderness  Chaperone, Prince Rome, CMA, was present for exam.  Assessment/Plan:  1. Vaginal discharge - Cervicovaginal ancillary only - if negative, consider vaginal moisturizing product but will not plan to use estrogen products  2. Colon cancer screening - Ambulatory referral to Gastroenterology  3.  Intramural fibroids - exam c/w fibroids, smaller in size than a few years ago.  May consider repeat PUS if vaginitis testing is normal and vaginal moisturizing product does not help.  23 minutes of total time was spent for this patient encounter, including preparation, face-to-face counseling with the patient and coordination of care, and documentation of the encounter.

## 2020-12-16 DIAGNOSIS — D251 Intramural leiomyoma of uterus: Secondary | ICD-10-CM | POA: Insufficient documentation

## 2020-12-19 LAB — CERVICOVAGINAL ANCILLARY ONLY
Bacterial Vaginitis (gardnerella): NEGATIVE
Candida Glabrata: NEGATIVE
Candida Vaginitis: POSITIVE — AB
Comment: NEGATIVE
Comment: NEGATIVE
Comment: NEGATIVE

## 2020-12-22 ENCOUNTER — Telehealth: Payer: Self-pay | Admitting: Lactation Services

## 2020-12-22 NOTE — Telephone Encounter (Signed)
Patient called and left message on nurse voicemail that she has seen her results in My Chart and would like a prescription called in .

## 2020-12-23 ENCOUNTER — Other Ambulatory Visit: Payer: Self-pay | Admitting: *Deleted

## 2020-12-23 ENCOUNTER — Other Ambulatory Visit: Payer: Self-pay | Admitting: Family Medicine

## 2020-12-23 ENCOUNTER — Other Ambulatory Visit (HOSPITAL_BASED_OUTPATIENT_CLINIC_OR_DEPARTMENT_OTHER): Payer: Self-pay | Admitting: Obstetrics & Gynecology

## 2020-12-23 DIAGNOSIS — I83893 Varicose veins of bilateral lower extremities with other complications: Secondary | ICD-10-CM

## 2020-12-23 DIAGNOSIS — Z1231 Encounter for screening mammogram for malignant neoplasm of breast: Secondary | ICD-10-CM

## 2020-12-23 MED ORDER — FLUCONAZOLE 150 MG PO TABS: 150.0000 mg | ORAL_TABLET | Freq: Every day | ORAL | 0 refills | Status: DC

## 2020-12-23 MED ORDER — FLUCONAZOLE 150 MG PO TABS
150.0000 mg | ORAL_TABLET | Freq: Every day | ORAL | 2 refills | Status: DC
Start: 2020-12-23 — End: 2020-12-23

## 2020-12-23 NOTE — Progress Notes (Signed)
Rx for + wet prep for Candida

## 2020-12-24 NOTE — Telephone Encounter (Signed)
I think it is [facility] clinical pool.  Thanks for the message.  I took care of this and gave her the number to the [place] office.  Again, thank you!!

## 2020-12-30 ENCOUNTER — Ambulatory Visit (HOSPITAL_COMMUNITY)
Admission: RE | Admit: 2020-12-30 | Discharge: 2020-12-30 | Disposition: A | Discharge status: Home / Self Care | Payer: 59 | Source: Ambulatory Visit | Attending: Vascular Surgery | Admitting: Vascular Surgery

## 2020-12-30 ENCOUNTER — Other Ambulatory Visit: Payer: Self-pay

## 2020-12-30 ENCOUNTER — Ambulatory Visit (INDEPENDENT_AMBULATORY_CARE_PROVIDER_SITE_OTHER): Payer: 59 | Admitting: Physician Assistant

## 2020-12-30 ENCOUNTER — Ambulatory Visit: Payer: Self-pay

## 2020-12-30 VITALS — BP 113/74 | HR 63 | Temp 98.3°F | Resp 20 | Ht 65.5 in | Wt 224.9 lb

## 2020-12-30 DIAGNOSIS — I872 Venous insufficiency (chronic) (peripheral): Secondary | ICD-10-CM

## 2020-12-30 DIAGNOSIS — I83893 Varicose veins of bilateral lower extremities with other complications: Secondary | ICD-10-CM | POA: Insufficient documentation

## 2020-12-30 DIAGNOSIS — I8393 Asymptomatic varicose veins of bilateral lower extremities: Secondary | ICD-10-CM

## 2020-12-30 NOTE — Progress Notes (Signed)
Office Note     CC:  follow up Requesting Provider:  Donald Prose, MD  HPI: Candace Graham is a 50 y.o. (06/07/1971) female who presents for evaluation of spider veins and bilateral lower extremity edema.  Patient states she has noticed surface veins appearing on her legs over the past several years.  She also has been dealing with discomfort, heavy, fatigue feeling in bilateral lower extremities she attributes to edema and venous disease.  Patient states she has a strong family history of varicose veins.  She has been seen in the past by [place] [facility] who told her that she needed worked on on her veins.  However before any work-up be done the patient's insurance changed and was no longer accepted at [facility].  She admittedly does not wear compression, elevate her legs, or avoid prolonged sitting and standing.  She denies history of DVT, venous ulcerations, trauma, or prior vascular interventions.  She also denies claudication, rest pain, or nonhealing wounds of bilateral lower extremities.  She denies tobacco use.  Past medical history also significant for left renal carcinoma requiring partial nephrectomy which was performed in 2017.   Past Medical History:  Diagnosis Date  . Fibroid uterus   . History of anemia   . Renal cell carcinoma ([place])   . Varicose veins of left lower extremity    varicose veins    Past Surgical History:  Procedure Laterality Date  . DILATATION & CURETTAGE/HYSTEROSCOPY WITH MYOSURE N/A 06/02/2018   Procedure: DILATATION & CURETTAGE/HYSTEROSCOPY WITH MYOSURE;  Surgeon: Megan Salon, MD;  Location: [facility];  Service: Gynecology;  Laterality: N/A;  . DILATATION & CURRETTAGE/HYSTEROSCOPY WITH RESECTOCOPE N/A 04/25/2015   Procedure: DILATATION & CURETTAGE/HYSTEROSCOPY WITH RESECTOCOPE;  Surgeon: Megan Salon, MD;  Location: [facility];  Service: Gynecology;  Laterality: N/A;  . ROBOTIC ASSITED PARTIAL NEPHRECTOMY Left  07/27/2016   Procedure: XI ROBOTIC ASSITED LEFT PARTIAL NEPHRECTOMY;  Surgeon: Alexis Frock, MD;  Location: [facility];  Service: Urology;  Laterality: Left;  [name] VIDEO BRONCHOSCOPY WITH ENDOBRONCHIAL NAVIGATION N/A 09/12/2016   Procedure: VIDEO BRONCHOSCOPY WITH ENDOBRONCHIAL NAVIGATION;  Surgeon: Collene Gobble, MD;  Location: [facility];  Service: Thoracic;  Laterality: N/A;  . VIDEO BRONCHOSCOPY WITH ENDOBRONCHIAL NAVIGATION N/A 07/10/2017   Procedure: VIDEO BRONCHOSCOPY WITH ENDOBRONCHIAL NAVIGATION;  Surgeon: Collene Gobble, MD;  Location: [facility];  Service: Thoracic;  Laterality: N/A;    Social History   Socioeconomic History  . Marital status: Single    Spouse name: Not on file  . Number of children: 2  . Years of education: 20  . Highest education level: Not on file  Occupational History  . Not on file  Tobacco Use  . Smoking status: Never Smoker  . Smokeless tobacco: Never Used  Vaping Use  . Vaping Use: Never used  Substance and Sexual Activity  . Alcohol use: No  . Drug use: No  . Sexual activity: Yes    Partners: Male    Birth control/protection: Post-menopausal  Other Topics Concern  . Not on file  Social History Narrative   Fun: Travel, working with sons.   Denies religious beliefs effecting health care.    Denies abuse and feels safe at home.    Social Determinants of Health   Financial Resource Strain: Not on file  Food Insecurity: Not on file  Transportation Needs: Not on file  Physical Activity: Not on file  Stress: Not on file  Social Connections: Not  on file  Intimate Partner Violence: Not on file    Family History  Problem Relation Age of Onset  . Pancreatic cancer Mother   . Hypertension Mother   . Stroke Mother   . Thyroid disease Mother   . Hypertension Brother   . Hypertension Brother   . Heart disease Father   . Arthritis Father   . Alcohol abuse Maternal Grandmother   . Esophageal cancer Maternal Grandmother   . Drug abuse Maternal  Grandfather     Current Outpatient Medications  Medication Sig Dispense Refill  . hydrochlorothiazide (HYDRODIURIL) 12.5 MG tablet Take 12.5 mg by mouth daily.    . Multiple Vitamin (MULTIVITAMIN) tablet Take 1 tablet by mouth daily.    [name] VITAMIN D PO Take by mouth.    . fluconazole (DIFLUCAN) 150 MG tablet Take 1 tablet (150 mg total) by mouth daily. Repeat in 72 hours if needed (Patient not taking: Reported on 12/30/2020) 2 tablet 0   No current facility-administered medications for this visit.    No Known Allergies   REVIEW OF SYSTEMS:   [X]  denotes positive finding, [ ]  denotes negative finding Cardiac  Comments:  Chest pain or chest pressure:    Shortness of breath upon exertion:    Short of breath when lying flat:    Irregular heart rhythm:        Vascular    Pain in calf, thigh, or hip brought on by ambulation:    Pain in feet at night that wakes you up from your sleep:     Blood clot in your veins:    Leg swelling:         Pulmonary    Oxygen at home:    Productive cough:     Wheezing:         Neurologic    Sudden weakness in arms or legs:     Sudden numbness in arms or legs:     Sudden onset of difficulty speaking or slurred speech:    Temporary loss of vision in one eye:     Problems with dizziness:         Gastrointestinal    Blood in stool:     Vomited blood:         Genitourinary    Burning when urinating:     Blood in urine:        Psychiatric    Major depression:         Hematologic    Bleeding problems:    Problems with blood clotting too easily:        Skin    Rashes or ulcers:        Constitutional    Fever or chills:      PHYSICAL EXAMINATION:  Vitals:   12/30/20 1108  BP: 113/74  Pulse: 63  Resp: 20  Temp: 98.3 F (36.8 C)  TempSrc: Temporal  SpO2: 98%  Weight: 224 lb 14.4 oz (102 kg)  Height: 5' 5.5" (1.664 m)    General:  WDWN in NAD; vital signs documented above Gait: Not observed HENT: WNL,  normocephalic Pulmonary: normal non-labored breathing  Cardiac: regular HR Abdomen: soft, NT, no masses Skin: without rashes Vascular Exam/Pulses:  Right Left  Radial 2+ (normal) 2+ (normal)  DP 2+ (normal) 2+ (normal)  PT absent 1+ (weak)   Extremities: No significant edema noted bilateral lower extremities however prominent spider veins scattered on both lower extremities; no visible varicose veins Musculoskeletal: no muscle wasting  or atrophy  Neurologic: A&O X 3;  No focal weakness or paresthesias are detected Psychiatric:  The pt has Normal affect.          Non-Invasive Vascular Imaging:   Bilateral lower extremity venous reflux study negative for DVT R Negative for deep venous reflux Negative for GSV reflux Small saphenous vein reflux however diameter less than 4 mm L Deep venous reflux at common femoral vein Superficial venous reflux at saphenofemoral junction however GSV only mildly enlarged  ASSESSMENT/PLAN:: 50 y.o. female here for evaluation of spider veins and venous insufficiency  Patient is most bothered by aching pain she experiences with walking and at rest around the level of her medial distal thigh and into her lower legs Bilateral lower extremity venous reflux study negative for DVT Patient does have superficial venous reflux in the left leg more so than the right Recommendation would be for patient to regularly wear knee-high 15 to 20 mmHg compression stockings, periodically elevate her legs during the day which was reviewed, and avoid prolonged sitting and standing NSAIDs can be used for discomfort associated with veins Patient currently not interested in further evaluation for laser ablation; if venous symptoms worsen in the future we can repeat her venous reflux study however would not do this before at least 6 to 12 months Also offered the patient sclerotherapy due to scattered spider veins of bilateral lower extremities; after discussion with Estell Harpin, RN, patient decided to proceed with sclerotherapy which was also performed today Patient will follow up on an as-needed basis   Dagoberto Ligas, PA-C [facility] [phone]  Clinic MD:   Donzetta Matters

## 2020-12-30 NOTE — Progress Notes (Signed)
Pt was added on today after seeing APP. Treated her small reticular and wide spread scattered spider veins with Asclera 1%, administered with a 27g butterfly.  Patient received a total of 4 ml. She has a large cluster of veins on back of L leg that will likely need further treatments, as well as the other areas that we did not treat. She is aware of this and only wanted 2 vials today. Pt was given post op instructions both verbally and on handout. Will follow prn.    Photos: Yes.    Compression stockings applied: Yes.

## 2021-02-16 ENCOUNTER — Ambulatory Visit
Admission: RE | Admit: 2021-02-16 | Discharge: 2021-02-16 | Disposition: A | Discharge status: Home / Self Care | Payer: 59 | Source: Ambulatory Visit | Attending: Family Medicine | Admitting: Family Medicine

## 2021-02-16 ENCOUNTER — Other Ambulatory Visit: Payer: Self-pay

## 2021-02-16 DIAGNOSIS — Z1231 Encounter for screening mammogram for malignant neoplasm of breast: Secondary | ICD-10-CM

## 2021-02-17 ENCOUNTER — Ambulatory Visit: Payer: 59

## 2021-05-11 ENCOUNTER — Ambulatory Visit (HOSPITAL_BASED_OUTPATIENT_CLINIC_OR_DEPARTMENT_OTHER): Payer: 59 | Admitting: Obstetrics & Gynecology

## 2021-07-14 ENCOUNTER — Ambulatory Visit (INDEPENDENT_AMBULATORY_CARE_PROVIDER_SITE_OTHER): Payer: 59 | Admitting: Medical

## 2021-07-14 ENCOUNTER — Other Ambulatory Visit: Payer: Self-pay

## 2021-07-14 ENCOUNTER — Other Ambulatory Visit (HOSPITAL_COMMUNITY)
Admission: RE | Admit: 2021-07-14 | Discharge: 2021-07-14 | Disposition: A | Discharge status: Home / Self Care | Payer: 59 | Source: Ambulatory Visit | Attending: Medical | Admitting: Medical

## 2021-07-14 ENCOUNTER — Encounter (HOSPITAL_BASED_OUTPATIENT_CLINIC_OR_DEPARTMENT_OTHER): Payer: Self-pay | Admitting: Medical

## 2021-07-14 VITALS — Ht 67.5 in | Wt 209.6 lb

## 2021-07-14 DIAGNOSIS — B373 Candidiasis of vulva and vagina: Secondary | ICD-10-CM

## 2021-07-14 DIAGNOSIS — N898 Other specified noninflammatory disorders of vagina: Secondary | ICD-10-CM

## 2021-07-14 DIAGNOSIS — B3731 Acute candidiasis of vulva and vagina: Secondary | ICD-10-CM

## 2021-07-14 NOTE — Progress Notes (Signed)
   History:  [name] is a 50 y.o. [id number] who presents to clinic today for vaginal discharge x 1 week. She states it is white. She denies odor or itching, pain or bleeding. She has a new partner x 4 months and uses condoms sometimes.    The following portions of the patient's history were reviewed and updated as appropriate: allergies, current medications, family history, past medical history, social history, past surgical history and problem list.  Review of Systems:  Review of Systems  Constitutional:  Negative for fever.  Gastrointestinal:  Negative for abdominal pain.  Genitourinary:        Neg - vaginal bleeding, odor + vaginal discharge     Objective:  Physical Exam Ht 5' 7.5" (1.715 m)   Wt 209 lb 9.6 oz (95.1 kg)   LMP 08/15/2014   BMI 32.34 kg/m  Physical Exam Vitals and nursing note reviewed. Exam conducted with a chaperone present.  Constitutional:      General: She is not in acute distress.    Appearance: She is well-developed.  HENT:     Head: Normocephalic and atraumatic.  Cardiovascular:     Rate and Rhythm: Normal rate.  Pulmonary:     Effort: Pulmonary effort is normal.  Abdominal:     General: There is no distension.     Palpations: Abdomen is soft. There is no mass.     Tenderness: There is no abdominal tenderness. There is no guarding or rebound.  Genitourinary:    General: Normal vulva.     Vagina: Vaginal discharge (scant, thin, white discharge) present. No bleeding.     Cervix: No cervical motion tenderness, discharge or friability.     Uterus: Not enlarged and not tender.      Adnexa:        Right: No mass or tenderness.         Left: No mass or tenderness.    Skin:    General: Skin is warm and dry.     Findings: No erythema.  Neurological:     Mental Status: She is alert and oriented to person, place, and time.   Health Maintenance Due  Topic Date Due   COVID-19 Vaccine (1) Never done   Zoster Vaccines- Shingrix (1 of 2)  Never done   COLONOSCOPY (Pts 45-34yrs Insurance coverage will need to be confirmed)  Never done   INFLUENZA VACCINE  05/15/2021     Assessment & Plan:  1. Vaginal discharge - Cervicovaginal ancillary only( [place]) - Discussed possibility of BV and hygiene products that may be causing infection, otherwise, if negative, likely related to normal hormonal fluctuations in the post-menopausal period.  - Patient will receive results via MyChart and may return to the office PRN  Approximately 12 minutes of total time was spent with this patient on chart review, history taking, physical exam, patient education and documentation.   Luvenia Redden, PA-C 07/14/2021 10:43 AM

## 2021-07-17 LAB — CERVICOVAGINAL ANCILLARY ONLY
Bacterial Vaginitis (gardnerella): NEGATIVE
Candida Glabrata: NEGATIVE
Candida Vaginitis: POSITIVE — AB
Chlamydia: NEGATIVE
Comment: NEGATIVE
Comment: NEGATIVE
Comment: NEGATIVE
Comment: NEGATIVE
Comment: NEGATIVE
Comment: NORMAL
Neisseria Gonorrhea: NEGATIVE
Trichomonas: NEGATIVE

## 2021-07-17 MED ORDER — FLUCONAZOLE 150 MG PO TABS: 150.0000 mg | ORAL_TABLET | Freq: Once | ORAL | 0 refills | Status: AC

## 2021-07-17 NOTE — Addendum Note (Signed)
Addended by: Luvenia Redden on: 07/17/2021 01:34 PM   Modules accepted: Orders

## 2022-02-07 ENCOUNTER — Other Ambulatory Visit: Payer: Self-pay | Admitting: Family Medicine

## 2022-02-07 DIAGNOSIS — Z1231 Encounter for screening mammogram for malignant neoplasm of breast: Secondary | ICD-10-CM

## 2022-02-21 ENCOUNTER — Ambulatory Visit
Admission: RE | Admit: 2022-02-21 | Discharge: 2022-02-21 | Disposition: A | Discharge status: Home / Self Care | Payer: Commercial Managed Care - HMO | Source: Ambulatory Visit | Attending: Family Medicine | Admitting: Family Medicine

## 2022-02-21 DIAGNOSIS — Z1231 Encounter for screening mammogram for malignant neoplasm of breast: Secondary | ICD-10-CM

## 2022-04-24 ENCOUNTER — Encounter (HOSPITAL_BASED_OUTPATIENT_CLINIC_OR_DEPARTMENT_OTHER): Payer: Self-pay | Admitting: Advanced Practice Midwife

## 2022-04-24 ENCOUNTER — Ambulatory Visit (INDEPENDENT_AMBULATORY_CARE_PROVIDER_SITE_OTHER): Payer: Commercial Managed Care - HMO | Admitting: Advanced Practice Midwife

## 2022-04-24 VITALS — BP 117/79 | HR 66 | Ht 67.5 in | Wt 215.8 lb

## 2022-04-24 DIAGNOSIS — R102 Pelvic and perineal pain: Secondary | ICD-10-CM

## 2022-04-24 DIAGNOSIS — D251 Intramural leiomyoma of uterus: Secondary | ICD-10-CM | POA: Diagnosis not present

## 2022-04-24 DIAGNOSIS — Z01419 Encounter for gynecological examination (general) (routine) without abnormal findings: Secondary | ICD-10-CM

## 2022-04-24 NOTE — Progress Notes (Signed)
Subjective:     Candace Graham is a 51 y.o. female here at [facility] for a routine exam.  Current complaints: some burning .  Hx significant for uterine fibroids, lung nodules with dx lymphoma and f/u at [facility], and renal cell carcinoma.  LMP in 2016, no postmenopausal vaginal bleeding.  Some thin discharge without irritation/itching or odor.  Personal health questionnaire reviewed: yes.  Do you have a primary care provider? yes Do you feel safe at home? yes  [place] Office Visit from 04/24/2022 in [place]  PHQ-2 Total Score 0       Health Maintenance Due  Topic Date Due   COVID-19 Vaccine (1) Never done   Zoster Vaccines- Shingrix (1 of 2) Never done   COLONOSCOPY (Pts 45-24yr Insurance coverage will need to be confirmed)  Never done     Risk factors for chronic health problems: Smoking: No Alchohol/how much: Not asked Pt BMI: Body mass index is 33.3 kg/m.   Gynecologic History Patient's last menstrual period was 08/15/2014. Contraception: post menopausal status Last Pap: 2022. Results were: normal Last mammogram: 2023. Results were: normal  Obstetric History OB History  Gravida Para Term Preterm AB Living  '4 2 2   2 2  '$ SAB IAB Ectopic Multiple Live Births    2     2    # Outcome Date GA Lbr Len/2nd Weight Sex Delivery Anes PTL Lv  4 Term     M Vag-Spont   LIV  3 IAB           2 IAB           1 Term     M Vag-Spont   LIV     The following portions of the patient's history were reviewed and updated as appropriate: allergies, current medications, past family history, past medical history, past social history, past surgical history, and problem list.  Review of Systems Pertinent items noted in HPI and remainder of comprehensive ROS otherwise negative.    Objective:   BP 117/79 (BP Location: Right Arm, Patient Position: Sitting, Cuff Size: Large)   Pulse 66   Ht 5' 7.5" (1.715 m) Comment: reported  Wt 215 lb 12.8 oz (97.9  kg)   LMP 08/15/2014   BMI 33.30 kg/m  VS reviewed, nursing note reviewed,  Constitutional: well developed, well nourished, no distress HEENT: normocephalic CV: normal rate Pulm/chest wall: normal effort Breast Exam:  exam performed: right breast normal without mass, skin or nipple changes or axillary nodes, left breast normal without mass, skin or nipple changes or axillary nodes Abdomen: soft Neuro: alert and oriented x 3 Skin: warm, dry Psych: affect normal Pelvic exam:  Bimanual exam: Cervix 0/long/high, firm, anterior, neg CMT, uterus nontender, nonenlarged, adnexa without tenderness, enlargement, or mass       Assessment/Plan:   1. Fibroids, intramural   2. Acute pelvic pain, female --Pt with new burning sensation in lower abdomen.  Is scheduling colonoscopy/Cologuard with PCP.  Pain is not vaginal. No pain with intercourse.   --Pelvic exam today wnl --Outpatient [facility] evaluate given hx fibroids --F/U with PCP if [facility] and pain persists  3. Well woman exam with routine gynecological exam --No gyn concerns except for burning pain, see above --LMP 2016  --Pap wnl, neg hpv in 2022, next Pap in 3-5 years, discussed with pt and to repeat in 2025. --Annual exam in 1  year   Return in about 1 year (around 04/25/2023)  for annual exam.   Fatima Blank, CNM 1:31 PM

## 2022-05-02 ENCOUNTER — Other Ambulatory Visit (HOSPITAL_BASED_OUTPATIENT_CLINIC_OR_DEPARTMENT_OTHER): Payer: Commercial Managed Care - HMO

## 2022-05-04 ENCOUNTER — Ambulatory Visit (HOSPITAL_BASED_OUTPATIENT_CLINIC_OR_DEPARTMENT_OTHER): Payer: Self-pay | Admitting: Medical

## 2022-05-30 ENCOUNTER — Other Ambulatory Visit (HOSPITAL_BASED_OUTPATIENT_CLINIC_OR_DEPARTMENT_OTHER): Payer: Commercial Managed Care - HMO

## 2022-06-20 ENCOUNTER — Other Ambulatory Visit (HOSPITAL_BASED_OUTPATIENT_CLINIC_OR_DEPARTMENT_OTHER): Payer: Commercial Managed Care - HMO

## 2022-06-20 ENCOUNTER — Ambulatory Visit (HOSPITAL_BASED_OUTPATIENT_CLINIC_OR_DEPARTMENT_OTHER): Payer: Commercial Managed Care - HMO | Admitting: Obstetrics & Gynecology

## 2022-07-18 ENCOUNTER — Other Ambulatory Visit (HOSPITAL_BASED_OUTPATIENT_CLINIC_OR_DEPARTMENT_OTHER): Payer: Commercial Managed Care - HMO

## 2022-07-18 ENCOUNTER — Ambulatory Visit (HOSPITAL_BASED_OUTPATIENT_CLINIC_OR_DEPARTMENT_OTHER): Payer: Self-pay | Admitting: Obstetrics & Gynecology

## 2022-08-01 ENCOUNTER — Encounter (HOSPITAL_BASED_OUTPATIENT_CLINIC_OR_DEPARTMENT_OTHER): Payer: Self-pay | Admitting: Obstetrics & Gynecology

## 2022-08-01 ENCOUNTER — Ambulatory Visit (HOSPITAL_BASED_OUTPATIENT_CLINIC_OR_DEPARTMENT_OTHER): Payer: Self-pay | Admitting: Obstetrics & Gynecology

## 2022-08-01 ENCOUNTER — Ambulatory Visit (INDEPENDENT_AMBULATORY_CARE_PROVIDER_SITE_OTHER): Payer: Commercial Managed Care - HMO

## 2022-08-01 VITALS — BP 105/85 | HR 84 | Ht 67.5 in | Wt 217.8 lb

## 2022-08-01 DIAGNOSIS — D251 Intramural leiomyoma of uterus: Secondary | ICD-10-CM

## 2022-08-01 DIAGNOSIS — R102 Pelvic and perineal pain: Secondary | ICD-10-CM

## 2022-08-04 NOTE — Progress Notes (Signed)
Pt did not stay for appointment after ultrasound as was late for appointment due to being on the wrong floor in [facility] building.  Communication with pt documented on ultrasound results.

## 2022-08-23 ENCOUNTER — Other Ambulatory Visit: Payer: Self-pay

## 2022-08-23 ENCOUNTER — Encounter (HOSPITAL_BASED_OUTPATIENT_CLINIC_OR_DEPARTMENT_OTHER): Payer: Self-pay | Admitting: *Deleted

## 2022-08-23 ENCOUNTER — Emergency Department (HOSPITAL_BASED_OUTPATIENT_CLINIC_OR_DEPARTMENT_OTHER): Payer: Commercial Managed Care - HMO | Admitting: Radiology

## 2022-08-23 ENCOUNTER — Emergency Department (HOSPITAL_BASED_OUTPATIENT_CLINIC_OR_DEPARTMENT_OTHER)
Admission: EM | Admit: 2022-08-23 | Discharge: 2022-08-23 | Disposition: A | Discharge status: Home / Self Care | Payer: Commercial Managed Care - HMO | Attending: Emergency Medicine | Admitting: Emergency Medicine

## 2022-08-23 DIAGNOSIS — I10 Essential (primary) hypertension: Secondary | ICD-10-CM | POA: Insufficient documentation

## 2022-08-23 DIAGNOSIS — R911 Solitary pulmonary nodule: Secondary | ICD-10-CM | POA: Insufficient documentation

## 2022-08-23 DIAGNOSIS — R0789 Other chest pain: Secondary | ICD-10-CM | POA: Insufficient documentation

## 2022-08-23 DIAGNOSIS — Z85528 Personal history of other malignant neoplasm of kidney: Secondary | ICD-10-CM | POA: Diagnosis not present

## 2022-08-23 DIAGNOSIS — R918 Other nonspecific abnormal finding of lung field: Secondary | ICD-10-CM

## 2022-08-23 DIAGNOSIS — R079 Chest pain, unspecified: Secondary | ICD-10-CM | POA: Diagnosis present

## 2022-08-23 DIAGNOSIS — Z79899 Other long term (current) drug therapy: Secondary | ICD-10-CM | POA: Insufficient documentation

## 2022-08-23 LAB — TROPONIN I (HIGH SENSITIVITY)
Troponin I (High Sensitivity): 5 ng/L (ref <18)
Troponin I (High Sensitivity): 5 ng/L (ref <18)

## 2022-08-23 LAB — CBC
HCT: 37.2 % (ref 36.0–46.0)
Hemoglobin: 11.8 g/dL — ABNORMAL LOW (ref 12.0–15.0)
MCH: 25.7 pg — ABNORMAL LOW (ref 26.0–34.0)
MCHC: 31.7 g/dL (ref 30.0–36.0)
MCV: 80.9 fL (ref 80.0–100.0)
Platelets: 283 10*3/uL (ref 150–400)
RBC: 4.6 MIL/uL (ref 3.87–5.11)
RDW: 14.3 % (ref 11.5–15.5)
WBC: 5.7 10*3/uL (ref 4.0–10.5)
nRBC: 0 % (ref 0.0–0.2)

## 2022-08-23 LAB — BASIC METABOLIC PANEL
Anion gap: 11 (ref 5–15)
BUN: 16 mg/dL (ref 6–20)
CO2: 23 mmol/L (ref 22–32)
Calcium: 8.9 mg/dL (ref 8.9–10.3)
Chloride: 106 mmol/L (ref 98–111)
Creatinine, Ser: 0.81 mg/dL (ref 0.44–1.00)
GFR, Estimated: >60 mL/min (ref >60)
Glucose, Bld: 104 mg/dL — ABNORMAL HIGH (ref 70–99)
Potassium: 3.6 mmol/L (ref 3.5–5.1)
Sodium: 140 mmol/L (ref 135–145)

## 2022-08-23 MED ORDER — ASPIRIN 81 MG PO CHEW
324.0000 mg | CHEWABLE_TABLET | Freq: Once | ORAL | Status: AC
  Administered 2022-08-23: 324 mg via ORAL
  Filled 2022-08-23: qty 4

## 2022-08-23 NOTE — ED Triage Notes (Addendum)
Pt is here for left sided chest pain for 2 weeks.  Pt denies any sob, n/v or diaphoresis with this. Pt reports some tingling in her hands and lips intermittent and being under pressure from work.

## 2022-08-23 NOTE — Discharge Instructions (Addendum)
Return if you have any new or worsening symptoms.  Please make sure to continue following up regarding monitoring your lung nodules.

## 2022-08-23 NOTE — ED Notes (Signed)
Pt verbalizes understanding of discharge instructions. Opportunity for questioning and answers were provided. Pt discharged from ED to home.   ? ?

## 2022-08-23 NOTE — ED Provider Notes (Signed)
[facility] EMERGENCY DEPT Provider Note   CSN: [id number] Arrival date & time: 08/23/22  0324     History  Chief Complaint  Patient presents with   Chest Pain    Candace Graham is a 51 y.o. female.  The history is provided by the patient.  Chest Pain She has history of renal cell carcinoma, hypertension and comes in because of intermittent left-sided chest pain for the last 2 weeks.  Pain comes and goes without any particular pattern although she notices it more when she lays down to go to sleep.  It has never awakened her from sleep.  Pain is dull and will last a few minutes before going away.  She has noted some numbness in her left hand and also around her lips when the pain is present.  She also gets lightheaded but denies dry mouth.  There has been no exertional component.  She is a non-smoker and denies history of diabetes, hyperlipidemia and denies family history of premature coronary atherosclerosis.  Although she has history of renal cell carcinoma, she had nephrectomy in 2017 and is cancer free.   Home Medications Prior to Admission medications   Medication Sig Start Date End Date Taking? Authorizing Provider  hydrochlorothiazide (HYDRODIURIL) 12.5 MG tablet Take 12.5 mg by mouth daily. 09/11/20   [provider]  Multiple Vitamin (MULTIVITAMIN) tablet Take 1 tablet by mouth daily.    [provider]  VITAMIN D PO Take by mouth.    [provider]      Allergies    Patient has no known allergies.    Review of Systems   Review of Systems  Cardiovascular:  Positive for chest pain.  All other systems reviewed and are negative.   Physical Exam Updated Vital Signs BP (!) 145/82   Pulse 71   Temp 97.6 F (36.4 C) (Oral)   Resp 16   LMP 08/15/2014   SpO2 98%  Physical Exam Vitals and nursing note reviewed.   51 year old female, resting comfortably and in no acute distress. Vital signs are significant for borderline  elevated blood pressure. Oxygen saturation is 98%, which is normal. Head is normocephalic and atraumatic. PERRLA, EOMI. Oropharynx is clear. Neck is nontender and supple without adenopathy or JVD. Back is nontender and there is no CVA tenderness. Lungs are clear without rales, wheezes, or rhonchi. Chest is nontender. Heart has regular rate and rhythm without murmur. Abdomen is soft, flat, nontender. Extremities have no cyanosis or edema, full range of motion is present. Skin is warm and dry without rash. Neurologic: Mental status is normal, cranial nerves are intact, moves all extremities equally.  ED Results / Procedures / Treatments   Labs (all labs ordered are listed, but only abnormal results are displayed) Labs Reviewed  CBC - Abnormal; Notable for the following components:      Result Value   Hemoglobin 11.8 (*)    MCH 25.7 (*)    All other components within normal limits  BASIC METABOLIC PANEL  TROPONIN I (HIGH SENSITIVITY)    EKG EKG Interpretation  Date/Time:  Thursday August 23 2022 03:48:30 EST Ventricular Rate:  62 PR Interval:  158 QRS Duration: 93 QT Interval:  421 QTC Calculation: 428 R Axis:   27 Text Interpretation: Sinus rhythm Borderline T abnormalities, inferior leads When compared with ECG of 06/24/2015, No significant change was found Confirmed by Delora Fuel ([id number]) on 08/23/2022 4:20:16 AM  Radiology No results found.  Procedures Procedures  Cardiac monitor shows normal sinus rhythm, per my interpretation.  Medications Ordered in ED Medications - No data to display  ED Course/ Medical Decision Making/ A&P                           Medical Decision Making Amount and/or Complexity of Data Reviewed Labs: ordered. Radiology: ordered.  Risk OTC drugs.   Chest pain which is somewhat atypical, doubt ACS.  No chest wall tenderness.  No cough or dyspnea to suggest pneumonia.  No risk factors for pulmonary embolism.  I have reviewed and  interpreted her electrocardiogram, and my interpretation is borderline T wave abnormalities but unchanged from prior.  I have reviewed and interpreted her laboratory tests, and my interpretation is borderline elevated random glucose level, mild anemia which is not changed from her baseline, normal initial troponin.  I have ordered a dose of aspirin, patient will be observed in the emergency department.  I have ordered a repeat troponin.  Heart score is 3, which puts her at low risk for major adverse cardiac events in the next 6 weeks.  Repeat troponin is normal.  Patient is discharged with instructions to follow-up with PCP.  Encouraged to control bleeding if she has recurrent symptoms of dizziness or paresthesias.  Final Clinical Impression(s) / ED Diagnoses   Rx / DC Orders ED Discharge Orders     None         Delora Fuel, MD 26/71/24 [phone]

## 2023-03-20 ENCOUNTER — Other Ambulatory Visit (HOSPITAL_BASED_OUTPATIENT_CLINIC_OR_DEPARTMENT_OTHER): Payer: Self-pay

## 2023-03-20 ENCOUNTER — Emergency Department (HOSPITAL_BASED_OUTPATIENT_CLINIC_OR_DEPARTMENT_OTHER): Payer: Self-pay | Admitting: Radiology

## 2023-03-20 ENCOUNTER — Emergency Department (HOSPITAL_BASED_OUTPATIENT_CLINIC_OR_DEPARTMENT_OTHER)
Admission: EM | Admit: 2023-03-20 | Discharge: 2023-03-20 | Disposition: A | Discharge status: Home / Self Care | Payer: Self-pay | Attending: Emergency Medicine | Admitting: Emergency Medicine

## 2023-03-20 ENCOUNTER — Emergency Department (HOSPITAL_BASED_OUTPATIENT_CLINIC_OR_DEPARTMENT_OTHER): Payer: Self-pay

## 2023-03-20 ENCOUNTER — Other Ambulatory Visit: Payer: Self-pay

## 2023-03-20 ENCOUNTER — Encounter (HOSPITAL_BASED_OUTPATIENT_CLINIC_OR_DEPARTMENT_OTHER): Payer: Self-pay | Admitting: *Deleted

## 2023-03-20 DIAGNOSIS — R2243 Localized swelling, mass and lump, lower limb, bilateral: Secondary | ICD-10-CM | POA: Insufficient documentation

## 2023-03-20 DIAGNOSIS — R519 Headache, unspecified: Secondary | ICD-10-CM | POA: Insufficient documentation

## 2023-03-20 DIAGNOSIS — Z79899 Other long term (current) drug therapy: Secondary | ICD-10-CM | POA: Insufficient documentation

## 2023-03-20 DIAGNOSIS — Z85528 Personal history of other malignant neoplasm of kidney: Secondary | ICD-10-CM | POA: Insufficient documentation

## 2023-03-20 LAB — BASIC METABOLIC PANEL
Anion gap: 9 (ref 5–15)
BUN: 16 mg/dL (ref 6–20)
CO2: 26 mmol/L (ref 22–32)
Calcium: 9.2 mg/dL (ref 8.9–10.3)
Chloride: 107 mmol/L (ref 98–111)
Creatinine, Ser: 0.72 mg/dL (ref 0.44–1.00)
GFR, Estimated: >60 mL/min (ref >60)
Glucose, Bld: 84 mg/dL (ref 70–99)
Potassium: 3.9 mmol/L (ref 3.5–5.1)
Sodium: 142 mmol/L (ref 135–145)

## 2023-03-20 LAB — CBC WITH DIFFERENTIAL/PLATELET
Abs Immature Granulocytes: 0.01 10*3/uL (ref 0.00–0.07)
Basophils Absolute: 0 10*3/uL (ref 0.0–0.1)
Basophils Relative: 1 %
Eosinophils Absolute: 0.2 10*3/uL (ref 0.0–0.5)
Eosinophils Relative: 4 %
HCT: 38.4 % (ref 36.0–46.0)
Hemoglobin: 12.2 g/dL (ref 12.0–15.0)
Immature Granulocytes: 0 %
Lymphocytes Relative: 41 %
Lymphs Abs: 2.2 10*3/uL (ref 0.7–4.0)
MCH: 25.4 pg — ABNORMAL LOW (ref 26.0–34.0)
MCHC: 31.8 g/dL (ref 30.0–36.0)
MCV: 79.8 fL — ABNORMAL LOW (ref 80.0–100.0)
Monocytes Absolute: 0.5 10*3/uL (ref 0.1–1.0)
Monocytes Relative: 9 %
Neutro Abs: 2.4 10*3/uL (ref 1.7–7.7)
Neutrophils Relative %: 45 %
Platelets: 294 10*3/uL (ref 150–400)
RBC: 4.81 MIL/uL (ref 3.87–5.11)
RDW: 13.7 % (ref 11.5–15.5)
WBC: 5.3 10*3/uL (ref 4.0–10.5)
nRBC: 0 % (ref 0.0–0.2)

## 2023-03-20 LAB — BRAIN NATRIURETIC PEPTIDE: B Natriuretic Peptide: 30 pg/mL (ref 0.0–100.0)

## 2023-03-20 LAB — CBG MONITORING, ED: Glucose-Capillary: 76 mg/dL (ref 70–99)

## 2023-03-20 LAB — MAGNESIUM: Magnesium: 1.8 mg/dL (ref 1.7–2.4)

## 2023-03-20 MED ORDER — KETOROLAC TROMETHAMINE 30 MG/ML IJ SOLN
30.0000 mg | Freq: Once | INTRAMUSCULAR | Status: AC
  Administered 2023-03-20: 30 mg via INTRAVENOUS
  Filled 2023-03-20: qty 1

## 2023-03-20 MED ORDER — PROCHLORPERAZINE EDISYLATE 10 MG/2ML IJ SOLN
10.0000 mg | Freq: Once | INTRAMUSCULAR | Status: AC
  Administered 2023-03-20: 10 mg via INTRAVENOUS
  Filled 2023-03-20: qty 2

## 2023-03-20 MED ORDER — DIPHENHYDRAMINE HCL 50 MG/ML IJ SOLN
12.5000 mg | Freq: Once | INTRAMUSCULAR | Status: AC
  Administered 2023-03-20: 12.5 mg via INTRAVENOUS
  Filled 2023-03-20: qty 1

## 2023-03-20 NOTE — Discharge Instructions (Signed)
It was a pleasure taking care of you here in the emergency department  Your workup today was reassuring  I would recommend starting to take your home blood pressure medication as your blood pressure was elevated here  Follow-up outpatient with primary care provider as well as [facility]  Return for new or worsening symptoms

## 2023-03-20 NOTE — ED Notes (Signed)
Discharge paperwork given and verbally understood. 

## 2023-03-20 NOTE — ED Provider Notes (Signed)
[place] EMERGENCY DEPARTMENT AT [facility] Provider Note   CSN: [id number] Arrival date & time: 03/20/23  1300     History  Chief Complaint  Patient presents with   Headache    Candace Graham is a 52 y.o. female history of lower extremity edema, renal cell carcinoma s/p partial nephrectomy, anemia, lymphoma in remission followed by [facility] here for evaluation of multiple complaints.  Her last 3 days she has had a headache which she describes as a pressure to the left side of her head.  No diplopia, blurred vision.  No eye pain.  Headache gradual.  No sudden onset thunderclap headache.  No fever, neck stiffness, neck rigidity.  No CP, SOB.  She has also noted some intermittent swelling in her bilateral feet as well as her fingertips.  No significantly her fingertips will tingle.  No unilateral symptoms, lateral weakness.  No photophobia or phonophobia.  No recent falls or injuries.  No abdominal pain.  She has noted she has been eating more fried and salty food than baseline.  She is supposed to be on HCTZ for BP at home however states she takes this intermittently depending on her edema.  She has not taken this in quite some time.  She has no lower extremity pain, redness or warmth.  Was supposed to follow-up with [facility] for her chest x-ray approximately 6 months ago for screening of CA however did not have standing chest xray performed. No hx of demyelinating disease.  No meds PTA for headache  HPI     Home Medications Prior to Admission medications   Medication Sig Start Date End Date Taking? Authorizing Provider  hydrochlorothiazide (HYDRODIURIL) 12.5 MG tablet Take 12.5 mg by mouth daily. 09/11/20   [provider]  Multiple Vitamin (MULTIVITAMIN) tablet Take 1 tablet by mouth daily.    [provider]  VITAMIN D PO Take by mouth.    [provider]      Allergies    Patient has no known allergies.    Review of Systems   Review of Systems   Constitutional: Negative.   HENT: Negative.    Respiratory: Negative.    Cardiovascular:  Positive for leg swelling. Negative for chest pain and palpitations.  Gastrointestinal: Negative.   Genitourinary: Negative.   Musculoskeletal: Negative.   Skin: Negative.   Neurological:  Positive for numbness (tingling BL finger tips intermittently) and headaches. Negative for dizziness, tremors, seizures, syncope, facial asymmetry, speech difficulty, weakness and light-headedness.  All other systems reviewed and are negative.   Physical Exam Updated Vital Signs BP (!) 153/87   Pulse 67   Temp 97.7 F (36.5 C) (Oral)   Resp 19   LMP 08/15/2014   SpO2 100%  Physical Exam Physical Exam  Constitutional: Pt is oriented to person, place, and time. Pt appears well-developed and well-nourished. No distress.  HENT:  Head: Normocephalic and atraumatic.  Mouth/Throat: Oropharynx is clear and moist.  Eyes: Conjunctivae and EOM are normal. Pupils are equal, round, and reactive to light. No scleral icterus.  No horizontal, vertical or rotational nystagmus  Neck: Normal range of motion. Neck supple.  Full active and passive ROM without pain No midline or paraspinal tenderness No nuchal rigidity or meningeal signs  Cardiovascular: Normal rate, regular rhythm and intact distal pulses.   Pulmonary/Chest: Effort normal and breath sounds normal. No respiratory distress. Pt has no wheezes. No rales.  Abdominal: Soft. Bowel sounds are normal. There is no tenderness. There is no  rebound and no guarding.  Musculoskeletal: Normal range of motion.  No bony tenderness, compartments soft.  Nontender bilateral upper and lower extremities.  No edema to hands.  Trace nonpitting edema to bilateral feet, does not extend into calves, compartments soft, Homan negative Lymphadenopathy:    No cervical adenopathy.  Neurological: Pt. is alert and oriented to person, place, and time. He has normal reflexes. No cranial nerve  deficit.  Exhibits normal muscle tone. Coordination normal.  Mental Status:  Alert, oriented, thought content appropriate. Speech fluent without evidence of aphasia. Able to follow 2 step commands without difficulty.  Cranial Nerves:  II:  Peripheral visual fields grossly normal, pupils equal, round, reactive to light III,IV, VI: ptosis not present, extra-ocular motions intact bilaterally  V,VII: smile symmetric, facial light touch sensation equal VIII: hearing grossly normal bilaterally  IX,X: midline uvula rise  XI: bilateral shoulder shrug equal and strong XII: midline tongue extension  Motor:  5/5 BUE and BLE Sensory: Pinprick and light touch normal in all extremities.  Cerebellar: normal finger-to-nose with bilateral upper extremities Gait: normal gait and balance CV: distal pulses palpable throughout   Skin: Skin is warm and dry. No rash noted. Pt is not diaphoretic.  Psychiatric: Pt has a normal mood and affect. Behavior is normal. Judgment and thought content normal.  Nursing note and vitals reviewed.  ED Results / Procedures / Treatments   Labs (all labs ordered are listed, but only abnormal results are displayed) Labs Reviewed  CBC WITH DIFFERENTIAL/PLATELET - Abnormal; Notable for the following components:      Result Value   MCV 79.8 (*)    MCH 25.4 (*)    All other components within normal limits  BASIC METABOLIC PANEL  MAGNESIUM  BRAIN NATRIURETIC PEPTIDE  CBG MONITORING, ED    EKG EKG Interpretation  Date/Time:  Wednesday March 20 2023 14:53:58 EDT Ventricular Rate:  75 PR Interval:  167 QRS Duration: 94 QT Interval:  390 QTC Calculation: 436 R Axis:   51 Text Interpretation: Sinus rhythm Low voltage, precordial leads No significant change since last tracing Confirmed by Linwood Dibbles [phone]) on 03/20/2023 2:58:20 PM  Radiology CT Head Wo Contrast  Result Date: 03/20/2023 CLINICAL DATA:  1 set headache. EXAM: CT HEAD WITHOUT CONTRAST TECHNIQUE: Contiguous  axial images were obtained from the base of the skull through the vertex without intravenous contrast. RADIATION DOSE REDUCTION: This exam was performed according to the departmental dose-optimization program which includes automated exposure control, adjustment of the mA and/or kV according to patient size and/or use of iterative reconstruction technique. COMPARISON:  None Available. FINDINGS: Brain: There is no evidence for acute hemorrhage, hydrocephalus, mass lesion, or abnormal extra-axial fluid collection. No definite CT evidence for acute infarction. Vascular: No hyperdense vessel or unexpected calcification. Skull: No evidence for fracture. No worrisome lytic or sclerotic lesion. Sinuses/Orbits: Visualized portions of the globes and intraorbital fat are unremarkable. The visualized paranasal sinuses and mastoid air cells are clear. Other: None. IMPRESSION: No acute intracranial abnormality. Electronically Signed   By: Kennith Center M.D.   On: 03/20/2023 15:39   DG Chest 2 View  Result Date: 03/20/2023 CLINICAL DATA:  History of metastatic renal cell carcinoma with several day history of left-sided headache EXAM: CHEST - 2 VIEW COMPARISON:  Chest radiograph dated 08/23/2022 CT chest dated 04/22/2020 FINDINGS: Normal lung volumes. Similar bilateral pulmonary nodules, largest in the right middle lobe. No focal consolidations. No pleural effusion or pneumothorax. The heart size and mediastinal contours are within  normal limits. No acute osseous abnormality. IMPRESSION: 1. No acute cardiopulmonary process. 2. Similar bilateral pulmonary nodules, largest in the right middle lobe. Electronically Signed   By: Agustin Cree M.D.   On: 03/20/2023 15:37    Procedures Procedures    Medications Ordered in ED Medications  prochlorperazine (COMPAZINE) injection 10 mg (10 mg Intravenous Given 03/20/23 1448)  diphenhydrAMINE (BENADRYL) injection 12.5 mg (12.5 mg Intravenous Given 03/20/23 1448)  ketorolac (TORADOL) 30  MG/ML injection 30 mg (30 mg Intravenous Given 03/20/23 1722)    ED Course/ Medical Decision Making/ A&P   52 year old here for evaluation multiple complaints.  Afebrile, nonseptic, not ill-appearing. Headache over the last few days.  No recent traumatic injuries, no sudden onset thunderclap headache.  Also with complaints of some intermittent swelling to bilateral hands and feet.  Possibly has some trace edema to bilateral feet.  She has nonfocal neuroexam without deficits.  She appears otherwise well.  Will plan on check labs, imaging, migraine cocktail and reassess  Labs and imaging personally viewed and interpreted:  CBC without leukocytosis Metabolic panel significant normality BNP 30 Magnesium 1.8 CT head without significant findings Chest x-ray with stable pulmonary nodules EKG without ischemic changes  Patient reassessed.  Given migraine cocktail.  Headache completely resolved.  She continues to have a nonfocal neuroexam without deficit.  She appears otherwise well.  Will DC home with symptomatic management, encouraged follow-up outpatient.  At this time low suspicion for CVA, demyelinating disease, meningitis, acute angle glaucoma, giant cell arteritis, trigeminal neuralgia, dissection, dural venous sinus thrombosis as cause of her headache.  With regards to her intermittent swelling she has no significant swelling currently.  She does not appear grossly fluid overloaded.  She has had some intermittent elevated blood pressures.  I encouraged her to begin taking her HCTZ was prescribed by her previous provider.  At this time as patient for VTE, infectious process, fluid overload.  Will have her follow-up outpatient.  The patient has been appropriately medically screened and/or stabilized in the ED. I have low suspicion for any other emergent medical condition which would require further screening, evaluation or treatment in the ED or require inpatient management.  Patient is  hemodynamically stable and in no acute distress.  Patient able to ambulate in department prior to ED.  Evaluation does not show acute pathology that would require ongoing or additional emergent interventions while in the emergency department or further inpatient treatment.  I have discussed the diagnosis with the patient and answered all questions.  Pain is been managed while in the emergency department and patient has no further complaints prior to discharge.  Patient is comfortable with plan discussed in room and is stable for discharge at this time.  I have discussed strict return precautions for returning to the emergency department.  Patient was encouraged to follow-up with PCP/specialist refer to at discharge.                               Medical Decision Making Amount and/or Complexity of Data Reviewed External Data Reviewed: labs, radiology, ECG and notes. Labs: ordered. Decision-making details documented in ED Course. Radiology: ordered and independent interpretation performed. Decision-making details documented in ED Course. ECG/medicine tests: ordered and independent interpretation performed. Decision-making details documented in ED Course.  Risk OTC drugs. Prescription drug management. Parenteral controlled substances. Decision regarding hospitalization. Diagnosis or treatment significantly limited by social determinants of health.  Final Clinical Impression(s) / ED Diagnoses Final diagnoses:  Acute nonintractable headache, unspecified headache type    Rx / DC Orders ED Discharge Orders     None         Adeleigh Barletta A, PA-C 03/20/23 2347    Linwood Dibbles, MD 03/21/23 [phone]

## 2023-03-20 NOTE — ED Triage Notes (Signed)
Pt is here for evaluation of left sided headache for several days.  She also reports right hand tingling intermittently for several days.  No sensitivity to light or sound with this headache, no focal weaknss

## 2023-05-06 ENCOUNTER — Ambulatory Visit (HOSPITAL_BASED_OUTPATIENT_CLINIC_OR_DEPARTMENT_OTHER): Payer: Self-pay | Admitting: Obstetrics & Gynecology

## 2023-09-02 ENCOUNTER — Ambulatory Visit (HOSPITAL_BASED_OUTPATIENT_CLINIC_OR_DEPARTMENT_OTHER): Payer: Self-pay | Admitting: Obstetrics & Gynecology

## 2023-10-25 ENCOUNTER — Other Ambulatory Visit: Payer: Self-pay | Admitting: Family Medicine

## 2023-10-25 DIAGNOSIS — Z1231 Encounter for screening mammogram for malignant neoplasm of breast: Secondary | ICD-10-CM

## 2023-10-31 ENCOUNTER — Ambulatory Visit (HOSPITAL_BASED_OUTPATIENT_CLINIC_OR_DEPARTMENT_OTHER): Payer: Commercial Managed Care - HMO | Admitting: Obstetrics & Gynecology

## 2023-10-31 ENCOUNTER — Encounter (HOSPITAL_BASED_OUTPATIENT_CLINIC_OR_DEPARTMENT_OTHER): Payer: Self-pay | Admitting: Obstetrics & Gynecology

## 2023-10-31 ENCOUNTER — Other Ambulatory Visit (HOSPITAL_COMMUNITY)
Admission: RE | Admit: 2023-10-31 | Discharge: 2023-10-31 | Disposition: A | Discharge status: Home / Self Care | Payer: Commercial Managed Care - HMO | Source: Ambulatory Visit | Attending: Obstetrics & Gynecology | Admitting: Obstetrics & Gynecology

## 2023-10-31 VITALS — BP 130/94 | HR 70 | Ht 67.5 in | Wt 239.0 lb

## 2023-10-31 DIAGNOSIS — R5382 Chronic fatigue, unspecified: Secondary | ICD-10-CM | POA: Diagnosis not present

## 2023-10-31 DIAGNOSIS — Z124 Encounter for screening for malignant neoplasm of cervix: Secondary | ICD-10-CM | POA: Insufficient documentation

## 2023-10-31 DIAGNOSIS — Z6836 Body mass index (BMI) 36.0-36.9, adult: Secondary | ICD-10-CM | POA: Diagnosis not present

## 2023-10-31 DIAGNOSIS — Z1211 Encounter for screening for malignant neoplasm of colon: Secondary | ICD-10-CM

## 2023-10-31 DIAGNOSIS — C642 Malignant neoplasm of left kidney, except renal pelvis: Secondary | ICD-10-CM

## 2023-10-31 DIAGNOSIS — Z01419 Encounter for gynecological examination (general) (routine) without abnormal findings: Secondary | ICD-10-CM

## 2023-10-31 DIAGNOSIS — Z862 Personal history of diseases of the blood and blood-forming organs and certain disorders involving the immune mechanism: Secondary | ICD-10-CM | POA: Diagnosis not present

## 2023-10-31 DIAGNOSIS — E870 Hyperosmolality and hypernatremia: Secondary | ICD-10-CM | POA: Diagnosis not present

## 2023-10-31 NOTE — Progress Notes (Signed)
53 y.o. [id number] Single Black or [place] American female here for annual exam.  Doing well.  Denies vaginal bleeding.  Hasn't had a cycle in several.  She says she feels like she has a "warm sensation" almost all of the time and this feel to her like she is going to start her cycle.  Denies cramping and pain.  Bladder and bowel function is normal.    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  Last appt was 01/2023 at [facility] with Dr. Carla Drape, oncology.  Not receiving any treatment.  H/o RCC.  Has not had appointment recently.  States she will proceed with scheduling.    Patient's last menstrual period was 08/15/2014.          Sexually active: Yes.    The current method of family planning is post menopausal status.    Smoker:  no  Health Maintenance: Pap:  2021.  Due today. History of abnormal Pap:  no MMG:  02/22/2022 Colonoscopy:  guidelines reviewed BMD:   not indicated Screening Labs: ordered today   reports that she has never smoked. She has never used smokeless tobacco. She reports that she does not drink alcohol and does not use drugs.  Past Medical History:  Diagnosis Date   Fibroid uterus    History of anemia    Renal cell carcinoma (HCC)    Varicose veins of left lower extremity    varicose veins    Past Surgical History:  Procedure Laterality Date   DILATATION & CURETTAGE/HYSTEROSCOPY WITH MYOSURE N/A 06/02/2018   Procedure: DILATATION & CURETTAGE/HYSTEROSCOPY WITH MYOSURE;  Surgeon: Jerene Bears, MD;  Location: [facility];  Service: Gynecology;  Laterality: N/A;   DILATATION & CURRETTAGE/HYSTEROSCOPY WITH RESECTOCOPE N/A 04/25/2015   Procedure: DILATATION & CURETTAGE/HYSTEROSCOPY WITH RESECTOCOPE;  Surgeon: Jerene Bears, MD;  Location: [facility];  Service: Gynecology;  Laterality: N/A;   ROBOTIC ASSITED PARTIAL NEPHRECTOMY Left 07/27/2016   Procedure: XI ROBOTIC ASSITED  LEFT PARTIAL NEPHRECTOMY;  Surgeon: Sebastian Ache, MD;  Location: [facility];  Service: Urology;  Laterality: Left;   VIDEO BRONCHOSCOPY WITH ENDOBRONCHIAL NAVIGATION N/A 09/12/2016   Procedure: VIDEO BRONCHOSCOPY WITH ENDOBRONCHIAL NAVIGATION;  Surgeon: Leslye Peer, MD;  Location: [facility];  Service: Thoracic;  Laterality: N/A;   VIDEO BRONCHOSCOPY WITH ENDOBRONCHIAL NAVIGATION N/A 07/10/2017   Procedure: VIDEO BRONCHOSCOPY WITH ENDOBRONCHIAL NAVIGATION;  Surgeon: Leslye Peer, MD;  Location: [facility];  Service: Thoracic;  Laterality: N/A;    Current Outpatient Medications  Medication Sig Dispense Refill   Multiple Vitamin (MULTIVITAMIN) tablet Take 1 tablet by mouth daily.     VITAMIN D PO Take by mouth.     hydrochlorothiazide (HYDRODIURIL) 12.5 MG tablet Take 12.5 mg by mouth daily. (Patient not taking: Reported on 10/31/2023)     No current facility-administered medications for this visit.    Family History  Problem Relation Age of Onset   Pancreatic cancer Mother    Hypertension Mother    Stroke Mother    Thyroid disease Mother    Heart disease Father    Arthritis Father    Alcohol abuse Maternal Grandmother    Esophageal cancer Maternal Grandmother    Drug abuse Maternal Grandfather    Hypertension Brother    Hypertension Brother    Breast cancer Neg Hx     ROS: Constitutional: negative Genitourinary:negative  Exam:   BP (!) 130/94 (BP Location: Right Arm, Patient  Position: Sitting, Cuff Size: Normal)   Pulse 70   Ht 5' 7.5" (1.715 m)   Wt 239 lb (108.4 kg)   LMP 08/15/2014   BMI 36.88 kg/m   Height: 5' 7.5" (171.5 cm)  General appearance: alert, cooperative and appears stated age Head: Normocephalic, without obvious abnormality, atraumatic Neck: no adenopathy, supple, symmetrical, trachea midline and thyroid normal to inspection and palpation Lungs: clear to auscultation bilaterally Breasts: normal appearance, no masses or tenderness Heart: regular rate and  rhythm Abdomen: soft, non-tender; bowel sounds normal; no masses,  no organomegaly Extremities: extremities normal, atraumatic, no cyanosis or edema Skin: Skin color, texture, turgor normal. No rashes or lesions Lymph nodes: Cervical, supraclavicular, and axillary nodes normal. No abnormal inguinal nodes palpated Neurologic: Grossly normal   Pelvic: External genitalia:  no lesions              Urethra:  normal appearing urethra with no masses, tenderness or lesions              Bartholins and Skenes: normal                 Vagina: normal appearing vagina with normal color and no discharge, no lesions              Cervix: no lesions              Pap taken: Yes.   Bimanual Exam:  Uterus:  enlarged, 8 - 10  weeks size, globular              Adnexa: no mass, fullness, tenderness               Rectovaginal: Confirms               Anus:  normal sphincter tone, no lesions  Chaperone, Ina Homes, CMA, was present for exam.  Assessment/Plan: 1. Well woman exam with routine gynecological exam (Primary) - Pap smear and HR HPV obtained today - Mammogram 02/22/2022.  Pt aware this is due. - Colonoscopy has not been done.  Referral will be made again for pt. - Bone mineral density not indicated - lab work reviewed in Care Everywhere and ordered as per belowe - vaccines reviewed/updated  2. Cervical cancer screening - Cytology - PAP( [place])  3. History of anemia - CBC with Differential/Platelet - Iron, TIBC and Ferritin Panel  4. Chronic fatigue - TSH - VITAMIN D 25 Hydroxy (Vit-D Deficiency, Fractures)  5. BMI 36.0-36.9,adult - Comprehensive metabolic panel - Hemoglobin A1c - Lipid panel  6. Colon cancer screening - Ambulatory referral to Gastroenterology

## 2023-11-01 LAB — CBC WITH DIFFERENTIAL/PLATELET
Basophils Absolute: 0 10*3/uL (ref 0.0–0.2)
Basos: 1 %
EOS (ABSOLUTE): 0.3 10*3/uL (ref 0.0–0.4)
Eos: 4 %
Hematocrit: 41.6 % (ref 34.0–46.6)
Hemoglobin: 13.1 g/dL (ref 11.1–15.9)
Immature Grans (Abs): 0 10*3/uL (ref 0.0–0.1)
Immature Granulocytes: 0 %
Lymphocytes Absolute: 2.4 10*3/uL (ref 0.7–3.1)
Lymphs: 42 %
MCH: 25.3 pg — ABNORMAL LOW (ref 26.6–33.0)
MCHC: 31.5 g/dL (ref 31.5–35.7)
MCV: 80 fL (ref 79–97)
Monocytes Absolute: 0.5 10*3/uL (ref 0.1–0.9)
Monocytes: 8 %
Neutrophils Absolute: 2.6 10*3/uL (ref 1.4–7.0)
Neutrophils: 45 %
Platelets: 328 10*3/uL (ref 150–450)
RBC: 5.18 x10E6/uL (ref 3.77–5.28)
RDW: 13.6 % (ref 11.7–15.4)
WBC: 5.9 10*3/uL (ref 3.4–10.8)

## 2023-11-01 LAB — COMPREHENSIVE METABOLIC PANEL
ALT: 19 [IU]/L (ref 0–32)
AST: 22 [IU]/L (ref 0–40)
Albumin: 4.3 g/dL (ref 3.8–4.9)
Alkaline Phosphatase: 109 [IU]/L (ref 44–121)
BUN/Creatinine Ratio: 15 (ref 9–23)
BUN: 13 mg/dL (ref 6–24)
Bilirubin Total: 0.3 mg/dL (ref 0.0–1.2)
CO2: 20 mmol/L (ref 20–29)
Calcium: 9.4 mg/dL (ref 8.7–10.2)
Chloride: 104 mmol/L (ref 96–106)
Creatinine, Ser: 0.86 mg/dL (ref 0.57–1.00)
Globulin, Total: 3.2 g/dL (ref 1.5–4.5)
Glucose: 89 mg/dL (ref 70–99)
Potassium: 4.3 mmol/L (ref 3.5–5.2)
Sodium: 145 mmol/L — ABNORMAL HIGH (ref 134–144)
Total Protein: 7.5 g/dL (ref 6.0–8.5)
eGFR: 81 mL/min/{1.73_m2} (ref >59)

## 2023-11-01 LAB — LIPID PANEL
Chol/HDL Ratio: 4 {ratio} (ref 0.0–4.4)
Cholesterol, Total: 199 mg/dL (ref 100–199)
HDL: 50 mg/dL (ref >39)
LDL Chol Calc (NIH): 136 mg/dL — ABNORMAL HIGH (ref 0–99)
Triglycerides: 73 mg/dL (ref 0–149)
VLDL Cholesterol Cal: 13 mg/dL (ref 5–40)

## 2023-11-01 LAB — IRON,TIBC AND FERRITIN PANEL
Ferritin: 149 ng/mL (ref 15–150)
Iron Saturation: 26 % (ref 15–55)
Iron: 91 ug/dL (ref 27–159)
Total Iron Binding Capacity: 355 ug/dL (ref 250–450)
UIBC: 264 ug/dL (ref 131–425)

## 2023-11-01 LAB — HEMOGLOBIN A1C
Est. average glucose Bld gHb Est-mCnc: 114 mg/dL
Hgb A1c MFr Bld: 5.6 % (ref 4.8–5.6)

## 2023-11-01 LAB — TSH: TSH: 2.9 u[IU]/mL (ref 0.450–4.500)

## 2023-11-01 LAB — VITAMIN D 25 HYDROXY (VIT D DEFICIENCY, FRACTURES): Vit D, 25-Hydroxy: 28.2 ng/mL — ABNORMAL LOW (ref 30.0–100.0)

## 2023-11-04 LAB — CYTOLOGY - PAP
Comment: NEGATIVE
Diagnosis: NEGATIVE
High risk HPV: NEGATIVE

## 2023-11-05 ENCOUNTER — Encounter (HOSPITAL_BASED_OUTPATIENT_CLINIC_OR_DEPARTMENT_OTHER): Payer: Self-pay | Admitting: Obstetrics & Gynecology

## 2023-11-13 ENCOUNTER — Encounter (HOSPITAL_BASED_OUTPATIENT_CLINIC_OR_DEPARTMENT_OTHER): Payer: Self-pay | Admitting: Obstetrics & Gynecology

## 2023-11-13 ENCOUNTER — Ambulatory Visit
Admission: RE | Admit: 2023-11-13 | Discharge: 2023-11-13 | Disposition: A | Discharge status: Home / Self Care | Payer: Commercial Managed Care - HMO | Source: Ambulatory Visit | Attending: Family Medicine | Admitting: Family Medicine

## 2023-11-13 DIAGNOSIS — Z1231 Encounter for screening mammogram for malignant neoplasm of breast: Secondary | ICD-10-CM

## 2023-11-29 ENCOUNTER — Telehealth (HOSPITAL_BASED_OUTPATIENT_CLINIC_OR_DEPARTMENT_OTHER): Payer: Self-pay | Admitting: *Deleted

## 2023-11-29 NOTE — Telephone Encounter (Signed)
DOB verified. Informed pt of elevated sodium level on previous labs and recommendation to repeat. Pt provided with appt.

## 2023-11-29 NOTE — Telephone Encounter (Signed)
-----   Message from Jerene Bears sent at 11/29/2023  7:44 AM EST ----- Regarding: reepat blood work [name] Could you reach out to this pt.  I signed off on her lab work in [place] but possibly didn't route it to you.  Her sodium level was a little abnormal and wanted to repeat it to make sure it normalized.  I see my message but can't tell if I routed it.  Can you call her and apologize for me?  The lab order has been placed.  Thanks.  Rosalita Chessman

## 2023-12-02 ENCOUNTER — Other Ambulatory Visit (HOSPITAL_BASED_OUTPATIENT_CLINIC_OR_DEPARTMENT_OTHER): Payer: Commercial Managed Care - HMO

## 2023-12-02 DIAGNOSIS — E870 Hyperosmolality and hypernatremia: Secondary | ICD-10-CM

## 2023-12-03 LAB — BASIC METABOLIC PANEL
BUN/Creatinine Ratio: 15 (ref 9–23)
BUN: 13 mg/dL (ref 6–24)
CO2: 25 mmol/L (ref 20–29)
Calcium: 9.6 mg/dL (ref 8.7–10.2)
Chloride: 105 mmol/L (ref 96–106)
Creatinine, Ser: 0.87 mg/dL (ref 0.57–1.00)
Glucose: 81 mg/dL (ref 70–99)
Potassium: 4.7 mmol/L (ref 3.5–5.2)
Sodium: 144 mmol/L (ref 134–144)
eGFR: 79 mL/min/{1.73_m2} (ref >59)

## 2023-12-04 ENCOUNTER — Encounter (HOSPITAL_BASED_OUTPATIENT_CLINIC_OR_DEPARTMENT_OTHER): Payer: Self-pay | Admitting: Obstetrics & Gynecology

## 2023-12-13 ENCOUNTER — Ambulatory Visit (AMBULATORY_SURGERY_CENTER): Payer: Commercial Managed Care - HMO

## 2023-12-13 VITALS — Ht 67.5 in | Wt 235.0 lb

## 2023-12-13 DIAGNOSIS — Z1211 Encounter for screening for malignant neoplasm of colon: Secondary | ICD-10-CM

## 2023-12-13 MED ORDER — SUFLAVE 178.7 G PO SOLR: 1.0000 | ORAL | 0 refills | Status: AC

## 2023-12-13 NOTE — Progress Notes (Signed)
 No egg or soy allergy known to patient  No issues known to pt with past sedation with any surgeries or procedures Patient denies ever being told they had issues or difficulty with intubation  No FH of Malignant Hyperthermia Pt is not on diet pills Pt is not on  home 02  Pt is not on blood thinners  Pt denies issues with constipation  No A fib or A flutter Have any cardiac testing pending-- no  LOA: independent  Prep: suflave    PV completed with patient. Prep instructions sent via mychart and home address.

## 2023-12-20 ENCOUNTER — Ambulatory Visit (HOSPITAL_BASED_OUTPATIENT_CLINIC_OR_DEPARTMENT_OTHER)

## 2023-12-24 ENCOUNTER — Ambulatory Visit (HOSPITAL_BASED_OUTPATIENT_CLINIC_OR_DEPARTMENT_OTHER)

## 2023-12-26 ENCOUNTER — Other Ambulatory Visit (HOSPITAL_COMMUNITY)
Admission: RE | Admit: 2023-12-26 | Discharge: 2023-12-26 | Disposition: A | Discharge status: Home / Self Care | Source: Ambulatory Visit | Attending: Obstetrics & Gynecology | Admitting: Obstetrics & Gynecology

## 2023-12-26 ENCOUNTER — Ambulatory Visit (HOSPITAL_BASED_OUTPATIENT_CLINIC_OR_DEPARTMENT_OTHER)

## 2023-12-26 VITALS — BP 122/70 | HR 80 | Ht 67.5 in | Wt 239.0 lb

## 2023-12-26 DIAGNOSIS — N898 Other specified noninflammatory disorders of vagina: Secondary | ICD-10-CM

## 2023-12-26 NOTE — Progress Notes (Signed)
 NURSE VISIT- VAGINITIS/STD/POC  SUBJECTIVE:  Candace Graham is a 53 y.o. [id number] GYN patientfemale here for a vaginal swab for vaginitis screening.  She reports the following symptoms:  vaginal discharge (green)  for  days. Denies abnormal vaginal bleeding, significant pelvic pain, fever, or UTI symptoms.  OBJECTIVE:  BP 122/70 (BP Location: Right Arm, Patient Position: Sitting)   Pulse 80   Ht 5' 7.5" (1.715 m)   Wt 239 lb (108.4 kg)   LMP 08/15/2014   BMI 36.88 kg/m   Appears well, in no apparent distress  ASSESSMENT: Vaginal swab for vaginitis screening  PLAN: Self-collected vaginal probe for Bacterial Vaginosis and yeast sent to lab Treatment: to be determined once results are received Follow-up as needed if symptoms persist/worsen, or new symptoms develop

## 2023-12-27 ENCOUNTER — Other Ambulatory Visit (HOSPITAL_BASED_OUTPATIENT_CLINIC_OR_DEPARTMENT_OTHER): Payer: Self-pay | Admitting: Obstetrics & Gynecology

## 2023-12-27 ENCOUNTER — Encounter (HOSPITAL_BASED_OUTPATIENT_CLINIC_OR_DEPARTMENT_OTHER): Payer: Self-pay | Admitting: Obstetrics & Gynecology

## 2023-12-27 DIAGNOSIS — B9689 Other specified bacterial agents as the cause of diseases classified elsewhere: Secondary | ICD-10-CM

## 2023-12-27 DIAGNOSIS — B3731 Acute candidiasis of vulva and vagina: Secondary | ICD-10-CM

## 2023-12-27 LAB — CERVICOVAGINAL ANCILLARY ONLY
Bacterial Vaginitis (gardnerella): POSITIVE — AB
Candida Glabrata: NEGATIVE
Candida Vaginitis: POSITIVE — AB
Comment: NEGATIVE
Comment: NEGATIVE
Comment: NEGATIVE

## 2023-12-27 MED ORDER — METRONIDAZOLE 500 MG PO TABS: 500.0000 mg | ORAL_TABLET | Freq: Two times a day (BID) | ORAL | 0 refills | Status: AC

## 2023-12-27 MED ORDER — FLUCONAZOLE 150 MG PO TABS: 150.0000 mg | ORAL_TABLET | Freq: Once | ORAL | 0 refills | Status: AC

## 2024-01-03 ENCOUNTER — Encounter: Payer: Self-pay | Admitting: Internal Medicine

## 2024-01-05 ENCOUNTER — Encounter: Payer: Self-pay | Admitting: Certified Registered Nurse Anesthetist

## 2024-01-06 ENCOUNTER — Telehealth: Payer: Self-pay

## 2024-01-06 NOTE — Telephone Encounter (Signed)
 Attempted to reach patient- unable to speak with patient-left message for patient to call the office prior to 5pm otherwise her procedure may be cancelled- office just needs to know if she jhas received her prep from her local pharmacy or [facility]?? Will attempt to reach patient at a later time

## 2024-01-08 ENCOUNTER — Encounter: Payer: Commercial Managed Care - HMO | Admitting: Internal Medicine

## 2024-02-19 ENCOUNTER — Encounter (HOSPITAL_COMMUNITY): Payer: Self-pay

## 2024-02-19 LAB — COLOGUARD: COLOGUARD: NEGATIVE

## 2024-02-19 LAB — EXTERNAL GENERIC LAB PROCEDURE: COLOGUARD: NEGATIVE

## 2024-02-26 ENCOUNTER — Ambulatory Visit: Admitting: Family Medicine

## 2024-03-06 ENCOUNTER — Ambulatory Visit (HOSPITAL_COMMUNITY)
Admission: RE | Admit: 2024-03-06 | Discharge: 2024-03-06 | Disposition: A | Discharge status: Home / Self Care | Source: Ambulatory Visit | Attending: Family Medicine | Admitting: Family Medicine

## 2024-03-06 ENCOUNTER — Other Ambulatory Visit (HOSPITAL_COMMUNITY): Payer: Self-pay | Admitting: Family Medicine

## 2024-03-06 DIAGNOSIS — R202 Paresthesia of skin: Secondary | ICD-10-CM | POA: Insufficient documentation

## 2024-04-20 ENCOUNTER — Ambulatory Visit: Admitting: Physician Assistant

## 2024-06-08 ENCOUNTER — Encounter: Payer: Self-pay | Admitting: Family Medicine

## 2024-06-08 ENCOUNTER — Ambulatory Visit: Payer: Commercial Managed Care - HMO | Admitting: Family Medicine

## 2024-06-08 VITALS — BP 118/82 | HR 72 | Temp 98.0°F | Ht 67.5 in | Wt 233.8 lb

## 2024-06-08 DIAGNOSIS — Z905 Acquired absence of kidney: Secondary | ICD-10-CM

## 2024-06-08 DIAGNOSIS — D649 Anemia, unspecified: Secondary | ICD-10-CM | POA: Diagnosis not present

## 2024-06-08 DIAGNOSIS — Z7689 Persons encountering health services in other specified circumstances: Secondary | ICD-10-CM

## 2024-06-08 DIAGNOSIS — R600 Localized edema: Secondary | ICD-10-CM | POA: Diagnosis not present

## 2024-06-08 DIAGNOSIS — R030 Elevated blood-pressure reading, without diagnosis of hypertension: Secondary | ICD-10-CM

## 2024-06-08 DIAGNOSIS — C884 Extranodal marginal zone b-cell lymphoma of mucosa-associated lymphoid tissue (malt-lymphoma) not having achieved remission: Secondary | ICD-10-CM

## 2024-06-08 NOTE — Progress Notes (Unsigned)
 Established Patient Office Visit   Subjective  Patient ID: Candace Graham, female    DOB: 1971-06-07  Age: 53 y.o. MRN: [id number]  No chief complaint on file.   Pt is a 53 yo female who presents to est care and f/u on chronic issues.  Previously seen by Vyvyan Sun, [place].  Seen by Dr. Cleotilde Ob/Gyn.    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 No radiation therapy was required. Subsequently, nodules were identified in her lungs, prompting multiple bronchoscopies, needle-guided bronchoscopies, MRI, and CT scans. A bone marrow biopsy at [facility] confirmed a diagnosis of malt lymphoma. The lung nodules have remained stable since 2017. She has made dietary changes and uses herbal supplements, such as chlorophyll and black seed cumin oil, avoiding dairy, pork, and red meat, and primarily consuming chicken, [place], and fish.  She has a history of elevated blood pressure readings, particularly in stressful situations like dental appointments. She was prescribed hydrochlorothiazide at the lowest dosage, which she takes infrequently, sometimes only half a dose, to manage fluid retention related to sedentary behavior and salt sensitivity. She does not consider herself to have chronic hypertension.  She has noticed recent changes in her skin, including blotches on her face, and expresses concern about her eating habits. She seeks a comprehensive workup to understand her current health status, particularly in relation to her lymphoma diagnosis.  She experiences swelling in her feet, which she attributes to fluid retention from prolonged sitting. She works from home and acknowledges a lack of regular exercise. She drinks two to three cups of water  daily and is trying to improve her hydration.  She has a history of anemia and inconsistently takes iron supplements. She has no known allergies to medications or foods.    Patient Active  Problem List   Diagnosis Date Noted  . Fibroids, intramural 12/16/2020  . 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 History of prediabetes 06/02/2018  . Multiple lung nodules 06/27/2016  . Renal cell carcinoma (HCC) 06/27/2016  . Back pain 04/28/2015  . Morbid obesity (HCC) 04/28/2015  . Fibroids 02/16/2013   Past Medical History:  Diagnosis Date  . Fibroid uterus   . History of anemia   . Renal cell carcinoma (HCC)   . Varicose veins of left lower extremity    varicose veins   Past Surgical History:  Procedure Laterality Date  . DILATATION & CURETTAGE/HYSTEROSCOPY WITH MYOSURE N/A 06/02/2018   Procedure: DILATATION & CURETTAGE/HYSTEROSCOPY WITH MYOSURE;  Surgeon: Cleotilde Ronal RAMAN, MD;  Location: [facility];  Service: Gynecology;  Laterality: N/A;  . DILATATION & CURRETTAGE/HYSTEROSCOPY WITH RESECTOCOPE N/A 04/25/2015   Procedure: DILATATION & CURETTAGE/HYSTEROSCOPY WITH RESECTOCOPE;  Surgeon: Ronal RAMAN Cleotilde, MD;  Location: [facility];  Service: Gynecology;  Laterality: N/A;  . ROBOTIC ASSITED PARTIAL NEPHRECTOMY Left 07/27/2016   Procedure: XI ROBOTIC ASSITED LEFT PARTIAL NEPHRECTOMY;  Surgeon: Ricardo Likens, MD;  Location: [facility];  Service: Urology;  Laterality: Left;  SABRA VIDEO BRONCHOSCOPY WITH ENDOBRONCHIAL NAVIGATION N/A 09/12/2016   Procedure: VIDEO BRONCHOSCOPY WITH ENDOBRONCHIAL NAVIGATION;  Surgeon: Lamar RAMAN Chris, MD;  Location: [facility];  Service: Thoracic;  Laterality: N/A;  . VIDEO BRONCHOSCOPY WITH ENDOBRONCHIAL NAVIGATION N/A 07/10/2017   Procedure: VIDEO BRONCHOSCOPY WITH ENDOBRONCHIAL NAVIGATION;  Surgeon: Chris Lamar RAMAN, MD;  Location: [facility];  Service: Thoracic;  Laterality: N/A;   Social History   Tobacco Use  .  Smoking status: Never  . Smokeless tobacco: Never  Vaping Use  . Vaping status: Never Used  Substance Use Topics  . Alcohol use: No  . Drug use: No   Family History  Problem Relation  Age of Onset  . Pancreatic cancer Mother   . Hypertension Mother   . Stroke Mother   . Thyroid  disease Mother   . Heart disease Father   . Arthritis Father   . Hypertension Brother   . Hypertension Brother   . Alcohol abuse Maternal Grandmother   . Esophageal cancer Maternal Grandmother   . Drug abuse Maternal Grandfather   . Breast cancer Neg Hx   . Colon cancer Neg Hx   . Rectal cancer Neg Hx   . Stomach cancer Neg Hx    No Known Allergies  ROS Negative unless stated above    Objective:     LMP 08/15/2014  BP Readings from Last 3 Encounters:  12/26/23 122/70  10/31/23 (!) 130/94  03/20/23 (!) 153/87   Wt Readings from Last 3 Encounters:  12/26/23 239 lb (108.4 kg)  12/13/23 235 lb (106.6 kg)  10/31/23 239 lb (108.4 kg)      Physical Exam Constitutional:      General: She is not in acute distress.    Appearance: Normal appearance.  HENT:     Head: Normocephalic and atraumatic.     Nose: Nose normal.     Mouth/Throat:     Mouth: Mucous membranes are moist.  Cardiovascular:     Rate and Rhythm: Normal rate and regular rhythm.     Heart sounds: Normal heart sounds. No murmur heard.    No gallop.  Pulmonary:     Effort: Pulmonary effort is normal. No respiratory distress.     Breath sounds: Normal breath sounds. No wheezing, rhonchi or rales.  Skin:    General: Skin is warm and dry.  Neurological:     Mental Status: She is alert and oriented to person, place, and time.        10/31/2023    3:41 PM 08/01/2022   11:50 AM 04/24/2022   12:49 PM  Depression screen PHQ 2/9  Decreased Interest 0 0 0  Down, Depressed, Hopeless 0 0 0  PHQ - 2 Score 0 0 0       No data to display           No results found for any visits on 06/08/24.    Assessment & Plan:   There are no diagnoses linked to this encounter.  No follow-ups on file.   Clotilda JONELLE Single, MD

## 2024-06-11 DIAGNOSIS — Z905 Acquired absence of kidney: Secondary | ICD-10-CM | POA: Insufficient documentation

## 2024-06-11 DIAGNOSIS — R600 Localized edema: Secondary | ICD-10-CM | POA: Insufficient documentation

## 2024-06-11 DIAGNOSIS — D649 Anemia, unspecified: Secondary | ICD-10-CM | POA: Insufficient documentation

## 2024-06-11 DIAGNOSIS — R030 Elevated blood-pressure reading, without diagnosis of hypertension: Secondary | ICD-10-CM | POA: Insufficient documentation

## 2024-07-13 ENCOUNTER — Encounter: Payer: Self-pay | Admitting: Family Medicine

## 2024-07-13 ENCOUNTER — Ambulatory Visit: Admitting: Family Medicine

## 2024-07-13 VITALS — BP 128/88 | HR 63 | Temp 98.4°F | Ht 67.5 in | Wt 232.4 lb

## 2024-07-13 DIAGNOSIS — Z905 Acquired absence of kidney: Secondary | ICD-10-CM

## 2024-07-13 DIAGNOSIS — C884 Extranodal marginal zone b-cell lymphoma of mucosa-associated lymphoid tissue (malt-lymphoma) not having achieved remission: Secondary | ICD-10-CM

## 2024-07-13 DIAGNOSIS — R1319 Other dysphagia: Secondary | ICD-10-CM

## 2024-07-13 NOTE — Progress Notes (Signed)
 Established Patient Office Visit   Subjective  Patient ID: Candace Graham, female    DOB: May 14, 1971  Age: 53 y.o. MRN: [id number]  Chief Complaint  Patient presents with   Medical Management of Chronic Issues    Patient came in today for a 5 week follow-up for MALT,     PT is a 53 yo female seen for f/u.  Pt had recent f/u with [facility] for h/o MALT.  Pt states results of labs an cxr were normal.  Her next f/u is in 1 yr.  Pt having sensation of food getting stuck in her lower esophagus.  Eats mostly healthy.  Transitioning to more of a plant-based diet.  May have fried fish but no other fried foods.  Eats baked salmon several times per week.  Has onions several times a week but does not recall them getting heartburn symptoms.  Not currently taking anything for sensation.  Was intermittent fasting from 12 noon to 6 PM.      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) 01/01/2020   History of prediabetes 06/02/2018   Multiple lung nodules 06/27/2016   Renal cell carcinoma (HCC) 06/27/2016   Back pain 04/28/2015   Morbid obesity (HCC) 04/28/2015   Fibroids 02/16/2013   Past Medical History:  Diagnosis Date   Fibroid uterus    History of anemia    Renal cell carcinoma (HCC)    Varicose veins of left lower extremity    varicose veins   Past Surgical History:  Procedure Laterality Date   DILATATION & CURETTAGE/HYSTEROSCOPY WITH MYOSURE N/A 06/02/2018   Procedure: DILATATION & CURETTAGE/HYSTEROSCOPY WITH MYOSURE;  Surgeon: Cleotilde Ronal RAMAN, MD;  Location: [facility];  Service: Gynecology;  Laterality: N/A;   DILATATION & CURRETTAGE/HYSTEROSCOPY WITH RESECTOCOPE N/A  04/25/2015   Procedure: DILATATION & CURETTAGE/HYSTEROSCOPY WITH RESECTOCOPE;  Surgeon: Ronal RAMAN Cleotilde, MD;  Location: [facility];  Service: Gynecology;  Laterality: N/A;   ROBOTIC ASSITED PARTIAL NEPHRECTOMY Left 07/27/2016   Procedure: XI ROBOTIC ASSITED LEFT PARTIAL NEPHRECTOMY;  Surgeon: Ricardo Likens, MD;  Location: [facility];  Service: Urology;  Laterality: Left;   VIDEO BRONCHOSCOPY WITH ENDOBRONCHIAL NAVIGATION N/A 09/12/2016   Procedure: VIDEO BRONCHOSCOPY WITH ENDOBRONCHIAL NAVIGATION;  Surgeon: Lamar RAMAN Chris, MD;  Location: [facility];  Service: Thoracic;  Laterality: N/A;   VIDEO BRONCHOSCOPY WITH ENDOBRONCHIAL NAVIGATION N/A 07/10/2017   Procedure: VIDEO BRONCHOSCOPY WITH ENDOBRONCHIAL NAVIGATION;  Surgeon: Chris Lamar RAMAN, MD;  Location: [facility];  Service: Thoracic;  Laterality: N/A;   Social History   Tobacco Use   Smoking status: Never   Smokeless tobacco: Never  Vaping Use   Vaping status: Never Used  Substance Use Topics   Alcohol use: No   Drug use: No   Family History  Problem Relation Age of Onset   Pancreatic cancer Mother    Hypertension Mother    Stroke Mother    Thyroid  disease Mother    Heart disease Father    Arthritis Father    Hypertension Brother    Hypertension Brother    Alcohol abuse Maternal Grandmother    Esophageal cancer Maternal Grandmother    Drug abuse Maternal Grandfather    Breast cancer Neg Hx  Colon cancer Neg Hx    Rectal cancer Neg Hx    Stomach cancer Neg Hx    No Known Allergies  ROS Negative unless stated above    Objective:     BP 128/88 (BP Location: Left Arm, Patient Position: Sitting, Cuff Size: Large)   Pulse 63   Temp 98.4 F (36.9 C) (Oral)   Ht 5' 7.5 (1.715 m)   Wt 232 lb 6.4 oz (105.4 kg)   LMP 08/15/2014   SpO2 99%   BMI 35.86 kg/m  BP Readings from Last 3 Encounters:  07/13/24 128/88  06/08/24 118/82  12/26/23 122/70   Wt Readings from Last 3 Encounters:  07/13/24 232 lb 6.4 oz (105.4 kg)  06/08/24 233 lb  12.8 oz (106.1 kg)  12/26/23 239 lb (108.4 kg)      Physical Exam Constitutional:      General: She is not in acute distress.    Appearance: Normal appearance.  HENT:     Head: Normocephalic and atraumatic.     Nose: Nose normal.     Mouth/Throat:     Mouth: Mucous membranes are moist.  Cardiovascular:     Rate and Rhythm: Normal rate and regular rhythm.     Heart sounds: Normal heart sounds. No murmur heard.    No gallop.  Pulmonary:     Effort: Pulmonary effort is normal. No respiratory distress.     Breath sounds: Normal breath sounds. No wheezing, rhonchi or rales.  Abdominal:     General: Bowel sounds are normal.     Palpations: Abdomen is soft.     Tenderness: There is no abdominal tenderness.  Musculoskeletal:     Comments: No TTP of sternum/chest wall.  Skin:    General: Skin is warm and dry.  Neurological:     Mental Status: She is alert and oriented to person, place, and time.        06/08/2024    1:52 PM 10/31/2023    3:41 PM 08/01/2022   11:50 AM  Depression screen PHQ 2/9  Decreased Interest 0 0 0  Down, Depressed, Hopeless 0 0 0  PHQ - 2 Score 0 0 0  Altered sleeping 0    Tired, decreased energy 2    Change in appetite 1    Feeling bad or failure about yourself  0    Trouble concentrating 0    Moving slowly or fidgety/restless 0    Suicidal thoughts 0    PHQ-9 Score 3    Difficult doing work/chores Not difficult at all        06/08/2024    1:52 PM  GAD 7 : Generalized Anxiety Score  Nervous, Anxious, on Edge 0  Control/stop worrying 0  Worry too much - different things 0  Trouble relaxing 0  Restless 0  Easily annoyed or irritable 0  Afraid - awful might happen 0  Total GAD 7 Score 0  Anxiety Difficulty Not difficult at all     No results found for any visits on 07/13/24.    Assessment & Plan:   Esophageal dysphagia  MALT (mucosa associated lymphoid tissue)  History of partial nephrectomy   Pt with acute dysphagia.  Does not  note any pattern in symptoms.  Advised to keep a food diary.  Offered PPI and referral to GI.  Patient declines at this time.  Would like to try natural remedies first.  Patient to notify clinic for continued symptoms for referral to GI.  Extranodal  marginal zone B-cell lymphoma of mucosa associated lymphoid tissue (MALT) stable.  Labs and CXR done on 07/07/2024 at appointment with [facility].  Continue to monitor.  Continue follow-up yearly with their practice.  History of partial nephrectomy 2/2 renal cancer.  Avoid nephrotoxic medications.  Renally dose medications.  Discussed the importance of hydration.  Return if symptoms worsen or fail to improve.   Clotilda JONELLE Single, MD
